# Patient Record
Sex: Female | Born: 1975
Health system: Southern US, Community
[De-identification: ages and names within clinical notes are randomized; demographics above are authoritative.]

## PROBLEM LIST (undated history)

## (undated) DIAGNOSIS — B1910 Unspecified viral hepatitis B without hepatic coma: Secondary | ICD-10-CM

## (undated) DIAGNOSIS — R0609 Other forms of dyspnea: Secondary | ICD-10-CM

## (undated) DIAGNOSIS — R079 Chest pain, unspecified: Secondary | ICD-10-CM

## (undated) DIAGNOSIS — I10 Essential (primary) hypertension: Secondary | ICD-10-CM

## (undated) DIAGNOSIS — J329 Chronic sinusitis, unspecified: Secondary | ICD-10-CM

## (undated) DIAGNOSIS — K219 Gastro-esophageal reflux disease without esophagitis: Secondary | ICD-10-CM

## (undated) DIAGNOSIS — N6310 Unspecified lump in the right breast, unspecified quadrant: Secondary | ICD-10-CM

## (undated) DIAGNOSIS — J45909 Unspecified asthma, uncomplicated: Secondary | ICD-10-CM

## (undated) DIAGNOSIS — B019 Varicella without complication: Secondary | ICD-10-CM

## (undated) DIAGNOSIS — R0600 Dyspnea, unspecified: Secondary | ICD-10-CM

## (undated) DIAGNOSIS — J309 Allergic rhinitis, unspecified: Secondary | ICD-10-CM

## (undated) DIAGNOSIS — F419 Anxiety disorder, unspecified: Secondary | ICD-10-CM

## (undated) DIAGNOSIS — K2970 Gastritis, unspecified, without bleeding: Secondary | ICD-10-CM

## (undated) DIAGNOSIS — J189 Pneumonia, unspecified organism: Secondary | ICD-10-CM

## (undated) DIAGNOSIS — H6690 Otitis media, unspecified, unspecified ear: Secondary | ICD-10-CM

## (undated) HISTORY — DX: Varicella without complication: B01.9

## (undated) HISTORY — DX: Unspecified viral hepatitis B without hepatic coma: B19.10

## (undated) HISTORY — DX: Unspecified lump in the right breast, unspecified quadrant: N63.10

## (undated) HISTORY — DX: Chest pain, unspecified: R07.9

## (undated) HISTORY — DX: Chronic sinusitis, unspecified: J32.9

## (undated) HISTORY — DX: Allergic rhinitis, unspecified: J30.9

## (undated) HISTORY — PX: INNER EAR SURGERY: SHX679

## (undated) HISTORY — PX: EYE SURGERY: SHX253

## (undated) HISTORY — DX: Gastro-esophageal reflux disease without esophagitis: K21.9

## (undated) HISTORY — DX: Pneumonia, unspecified organism: J18.9

## (undated) HISTORY — DX: Unspecified asthma, uncomplicated: J45.909

## (undated) HISTORY — DX: Other forms of dyspnea: R06.09

## (undated) HISTORY — DX: Gastritis, unspecified, without bleeding: K29.70

## (undated) HISTORY — DX: Dyspnea, unspecified: R06.00

## (undated) HISTORY — DX: Anxiety disorder, unspecified: F41.9

## (undated) HISTORY — DX: Essential (primary) hypertension: I10

## (undated) HISTORY — DX: Otitis media, unspecified, unspecified ear: H66.90

## (undated) HISTORY — PX: DIAGNOSTIC LAPAROSCOPY: SUR761

---

## 1996-12-11 HISTORY — PX: REFRACTIVE SURGERY: SHX103

## 2005-12-11 DIAGNOSIS — J189 Pneumonia, unspecified organism: Secondary | ICD-10-CM

## 2005-12-11 HISTORY — DX: Pneumonia, unspecified organism: J18.9

## 2006-12-11 HISTORY — PX: LAPAROSCOPIC OVARIAN CYSTECTOMY: SHX6248

## 2009-07-02 ENCOUNTER — Emergency Department: Payer: Self-pay | Admitting: Emergency Medicine

## 2013-02-08 HISTORY — PX: OTHER SURGICAL HISTORY: SHX169

## 2013-08-06 ENCOUNTER — Ambulatory Visit: Payer: Self-pay | Admitting: Gastroenterology

## 2014-01-15 ENCOUNTER — Ambulatory Visit: Payer: Self-pay | Admitting: Gastroenterology

## 2014-03-05 ENCOUNTER — Encounter: Payer: Self-pay | Admitting: Maternal & Fetal Medicine

## 2014-05-18 ENCOUNTER — Ambulatory Visit: Payer: Self-pay

## 2014-06-04 DIAGNOSIS — B1910 Unspecified viral hepatitis B without hepatic coma: Secondary | ICD-10-CM | POA: Insufficient documentation

## 2014-06-07 ENCOUNTER — Inpatient Hospital Stay: Payer: Self-pay

## 2014-06-07 LAB — CBC WITH DIFFERENTIAL/PLATELET
BASOS ABS: 0 10*3/uL (ref 0.0–0.1)
BASOS PCT: 0.3 %
Eosinophil #: 0.1 10*3/uL (ref 0.0–0.7)
Eosinophil %: 1.1 %
HCT: 39 % (ref 35.0–47.0)
HGB: 13.6 g/dL (ref 12.0–16.0)
LYMPHS ABS: 1.6 10*3/uL (ref 1.0–3.6)
LYMPHS PCT: 15.4 %
MCH: 32 pg (ref 26.0–34.0)
MCHC: 35 g/dL (ref 32.0–36.0)
MCV: 91 fL (ref 80–100)
MONOS PCT: 8.1 %
Monocyte #: 0.9 x10 3/mm (ref 0.2–0.9)
NEUTROS ABS: 8 10*3/uL — AB (ref 1.4–6.5)
NEUTROS PCT: 75.1 %
PLATELETS: 239 10*3/uL (ref 150–440)
RBC: 4.26 10*6/uL (ref 3.80–5.20)
RDW: 13.6 % (ref 11.5–14.5)
WBC: 10.6 10*3/uL (ref 3.6–11.0)

## 2014-06-09 LAB — HEMATOCRIT: HCT: 32 % — AB (ref 35.0–47.0)

## 2014-06-09 LAB — BETA STREP CULTURE(ARMC)

## 2014-06-09 LAB — PATHOLOGY REPORT

## 2014-06-10 ENCOUNTER — Ambulatory Visit: Payer: Self-pay

## 2015-02-01 ENCOUNTER — Ambulatory Visit: Payer: Self-pay | Admitting: Gastroenterology

## 2015-04-03 NOTE — Consult Note (Signed)
   Maternal Age 39   Gravida 1   Para 0   Term Deliveries 0   Preterm Deliveries 0   Abortions 0   Living Children 0   Final EDD (dd-[id number]) 20-Jul-2014   GA Assessment: (Weeks) 34 week(s)   (Days) 2 day(s)   Blood Type (Maternal) B positive   Antibody Screen Results (Maternal) negative   HIV Results (Maternal) negative   Gonorrhea Results (Maternal) negative   Chlamydia Results (Maternal) negative   Hepatitis C Culture (Maternal) unknown   Herpes Results (Maternal) n/a   VDRL/RPR/Syphilis Results (Maternal) negative   Varicella Titer Results (Maternal) Positive   Rubella Results (Maternal) immune   Hepatitis B Surface Antigen Results (Maternal) positive   Group B Strep Results Maternal (Result >5wks must be treated as unknown) unknown/result > 5 weeks ago   Prenatal Care Adequate    Additional Comments Asked by T Brothers, CNM (Dr. Cristino Martes, supervising OB) to speak with 39 yo G1 mother after she was admitted with PROM (clear) about 7 am today and early labor.  Pregnancy is IVF induced and complicated by diet-controlled gestational DM and mother is Hep BsAG positive.. No fever or other Sx of infection; GBS unknown.  Spoke with patient and FOB about usual expectations for preterm infants at about [redacted] wks EGA, including need for SCN admission, possible respiratory distress, glucose or temp instability, and delayed onset of adequate PO feeding.  Projected probable LOS 2 - 3 wks depending primarily on establishment of oral feedings.  Also discussed breast feeding and skinp-to-skin care.  Patient and FOB were attentive and asked appropriate questions.  They expressed appreciation for my input.  Thank you for consulting Neonatology  Consult time 25 minutes (20 minutes face-to-face)   Parental Contact Consulted with Obstetrician regarding treatment options, As above   Electronic Signatures: Bettey Costa (MD)  (Signed 28-Jun-15 14:01)  Authored: PREGNANCY and LABOR,  ADDITIONAL COMMENTS   Last Updated: 28-Jun-15 14:01 by Bettey Costa (MD)

## 2015-04-20 NOTE — H&P (Signed)
L&D Evaluation:  History Expanded:  HPI 39 yo G1, EDD of 07/17/14 s/p IVF, presents at [redacted]w[redacted]d with c/o large gush of fluid. +FM, occasional contractions, +bloody show. PNC at [facility] notable for early entry to care after IVF, A1GDM with good control, AMA with negative Informaseq. 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 Received TDAP at 32 wks. EFW 4 lb 9oz (60%) at 32 wks.   Blood Type (Maternal) B positive   Group B Strep Results Maternal (Result >5wks must be treated as unknown) unknown/result > 5 weeks ago   Maternal HIV Negative   Maternal Syphilis Ab Nonreactive   Maternal Varicella Immune   Rubella Results (Maternal) immune   Maternal T-Dap Immune   Patient's Medical History Hep B carrier   Patient's Surgical History cystectomy, salpingectomy, eye surgery, IVF   Medications Pre Natal Vitamins   Allergies Novocaine (ok with Lidocaine)   Social History none   Exam:  Vital Signs 130s/60s-80s   General no apparent distress   Mental Status clear   Chest clear   Heart no murmur/gallop/rubs   Abdomen gravid, tender with contractions   Estimated Fetal Weight Average for gestational age   Edema no edema   Pelvic 1 per RN   Mebranes Ruptured, grossly ruptured   Description clear   FHT normal rate with no decels, category 1   Ucx q 4-5 min   Impression:  Impression IUP at [redacted]w[redacted]d with PPROM   Plan:  Plan antibiotics for GBBS prophylaxis   Comments Will augment labor as needed after antibiotics in x 4 hours Neonatology consult   Electronic Signatures: Ander Purpura (CNM)  (Signed 28-Jun-15 11:06)  Authored: L&D Evaluation   Last Updated: 28-Jun-15 11:06 by Ander Purpura (CNM)

## 2015-10-25 DIAGNOSIS — K2970 Gastritis, unspecified, without bleeding: Secondary | ICD-10-CM | POA: Insufficient documentation

## 2015-10-25 DIAGNOSIS — J309 Allergic rhinitis, unspecified: Secondary | ICD-10-CM | POA: Insufficient documentation

## 2015-10-25 DIAGNOSIS — K2930 Chronic superficial gastritis without bleeding: Secondary | ICD-10-CM | POA: Insufficient documentation

## 2015-10-25 DIAGNOSIS — F419 Anxiety disorder, unspecified: Secondary | ICD-10-CM | POA: Insufficient documentation

## 2015-11-17 ENCOUNTER — Ambulatory Visit
Admission: RE | Admit: 2015-11-17 | Discharge: 2015-11-17 | Disposition: A | Discharge status: Home / Self Care | Payer: BLUE CROSS/BLUE SHIELD | Source: Ambulatory Visit | Attending: Family Medicine | Admitting: Family Medicine

## 2015-11-17 ENCOUNTER — Encounter: Payer: Self-pay | Admitting: Family Medicine

## 2015-11-17 ENCOUNTER — Ambulatory Visit (INDEPENDENT_AMBULATORY_CARE_PROVIDER_SITE_OTHER): Payer: BLUE CROSS/BLUE SHIELD | Admitting: Family Medicine

## 2015-11-17 VITALS — BP 120/83 | HR 88 | Temp 98.3°F | Ht 61.0 in | Wt 174.0 lb

## 2015-11-17 DIAGNOSIS — M546 Pain in thoracic spine: Secondary | ICD-10-CM | POA: Insufficient documentation

## 2015-11-17 DIAGNOSIS — R079 Chest pain, unspecified: Secondary | ICD-10-CM | POA: Diagnosis not present

## 2015-11-17 DIAGNOSIS — M25561 Pain in right knee: Secondary | ICD-10-CM | POA: Insufficient documentation

## 2015-11-17 DIAGNOSIS — M47814 Spondylosis without myelopathy or radiculopathy, thoracic region: Secondary | ICD-10-CM | POA: Insufficient documentation

## 2015-11-17 DIAGNOSIS — Z0000 Encounter for general adult medical examination without abnormal findings: Secondary | ICD-10-CM | POA: Diagnosis not present

## 2015-11-17 DIAGNOSIS — H9193 Unspecified hearing loss, bilateral: Secondary | ICD-10-CM | POA: Diagnosis not present

## 2015-11-17 DIAGNOSIS — R0602 Shortness of breath: Secondary | ICD-10-CM | POA: Insufficient documentation

## 2015-11-17 LAB — TROPONIN I

## 2015-11-17 LAB — D-DIMER, QUANTITATIVE

## 2015-11-17 NOTE — Progress Notes (Signed)
BP 120/83 mmHg  Pulse 88  Temp(Src) 98.3 F (36.8 C)  Ht 5\' 1"  (1.549 m)  Wt 174 lb (78.926 kg)  BMI 32.89 kg/m2  SpO2 96%  LMP 11/14/2015 (Exact Date)   Subjective:    Patient ID: Laura Espinoza, female    DOB: [birth year], 39 y.o.   MRN: [id number]  HPI: Laura Espinoza is a 39 y.o. female  Chief Complaint  Patient presents with  . Annual Exam    done at GYN   She says she has been having chest pain; started 1-1/2 months ago; almost every day; tightness like squeezing, pointing over the left side of the chest and under the axilla; no shortness of breath; will stay all day; dull pain; comes and goes; sometimes gone for a whole day; right now, it is mild to moderate; sometimes with nausea She thinks it might be related to stress; possible lack of rest and anxiety Left arm gets numb; has dull pain under the left arm along chest wall and cannot lay on that side when she sleeps She does not exercise at all, so wouldn't know if exercise made it worse Very rare heartburn and acid reflux, and that's not this, alleviated with Tums Hx of pneumonia but pain is not in same place Sometimes doesn't feel like she has enough air No hx personally or in family of DVT or PE No fevers No cough No rash like shingles No back pain, nothing like pinched nerve wrapping around above the bra; rarely has lower back pain when sitting a long time at work Some poor sleep lately; felt hot at night; wonders if anxiety; no night sweats She has hx of anxiety; she was treated in the past, but made her feel so gray so she stopped it She uses camomile tea but no tylenol or heating pads Mother had heart disease; high blood pressure, then heart attack 6 years ago; in her 72's Father always had heart problems, in hospital several times with "pre-heart attack conditions" No chance she is pregnant, just had period  She also has problems with the right knee; walks and it clicks; really sharp pain, like something moving  around; can't barely walk at times; doesn't walk; pops and clicks; just a little bit of swelling; little bit of warmth; no hx of gout; no hx of injury to the knee; parents both have arthritis; she tried cream to massage over it; going on for many months, pain started when cold weather 1-2 months ago  Past Medical History  Diagnosis Date  . Allergic rhinitis   . Anxiety   . Gastritis   . OM (otitis media), recurrent     as a child; ruptured TM  . Recurrent sinusitis   . Pneumonia 2007    hospitalized for possible pneumonia in [place] for 2 weeks   Past Surgical History  Procedure Laterality Date  . Refractive surgery Bilateral 1998  . Laparoscopic ovarian cystectomy  2008    in [place]  . Fallopian tube removal Left March 2014   Family History  Problem Relation Age of Onset  . Hypertension Mother   . Heart disease Mother   . CAD Mother   . Heart attack Mother   . Arthritis Mother   . CAD Father   . Heart disease Father   . Glaucoma Father   . Cancer Father     kidney  . Hypertension Brother   . COPD Neg Hx   . Diabetes Neg Hx   .  Stroke Neg Hx    Social History  Substance Use Topics  . Smoking status: Never Smoker   . Smokeless tobacco: Never Used  . Alcohol Use: No     Comment: occasional   Relevant past medical, surgical, family and social history reviewed and updated as indicated. Interim medical history since our last visit reviewed. Allergies and medications reviewed and updated.  Review of Systems  Constitutional: Negative for fever.  Respiratory: Positive for shortness of breath. Negative for cough.   Cardiovascular: Positive for chest pain.  Musculoskeletal: Positive for arthralgias.  Hematological: Does not bruise/bleed easily.  Psychiatric/Behavioral: Negative for dysphoric mood. The patient is nervous/anxious.    Per HPI unless specifically indicated above     Objective:    BP 120/83 mmHg  Pulse 88  Temp(Src) 98.3 F (36.8 C)  Ht 5\' 1"  (1.549 m)   Wt 174 lb (78.926 kg)  BMI 32.89 kg/m2  SpO2 96%  LMP 11/14/2015 (Exact Date)  Wt Readings from Last 3 Encounters:  11/17/15 174 lb (78.926 kg)  03/22/15 168 lb (76.204 kg)    Physical Exam  Constitutional: She appears well-developed and well-nourished.  HENT:  Head: Normocephalic and atraumatic.  Right Ear: External ear normal.  Left Ear: External ear normal.  Nose: Nose normal.  Mouth/Throat: Oropharynx is clear and moist and mucous membranes are normal. Mucous membranes are not dry. No oropharyngeal exudate.  Deformed TM  Eyes: EOM are normal. Right eye exhibits no discharge. Left eye exhibits no discharge. No scleral icterus.  Neck: No JVD present. No thyromegaly present.  Cardiovascular: Normal rate and regular rhythm.  Exam reveals no gallop and no friction rub.   No murmur heard. Pulmonary/Chest: Effort normal and breath sounds normal. No respiratory distress. She has no wheezes. She has no rales.  Musculoskeletal: Normal range of motion. She exhibits no edema.  Lymphadenopathy:    She has no cervical adenopathy.  Neurological: She is alert. She displays normal reflexes. She exhibits normal muscle tone.  Skin: Skin is warm and dry. No rash noted. No erythema. No pallor.  Psychiatric: She has a normal mood and affect. Her behavior is normal. Judgment and thought content normal.      Assessment & Plan:   Problem List Items Addressed This Visit      Other   Chest pain at rest   Relevant Orders   EKG 12-Lead (Completed)   D-Dimer, Quantitative (Completed)   Troponin I (Completed)   Right knee pain   Relevant Orders   DG Knee Complete 4 Views Right (Completed)   Thoracic back pain   Relevant Orders   DG Thoracic Spine W/Swimmers (Completed)   Shortness of breath   Relevant Orders   DG Chest 2 View (Completed)   Preventative health care - Primary   Relevant Orders   Lipid Panel w/o Chol/HDL Ratio (Completed)   HIV antibody (Completed)   Comprehensive metabolic  panel (Completed)   CBC with Differential/Platelet (Completed)    Other Visit Diagnoses    Hearing loss, bilateral        Relevant Orders    Ambulatory referral to ENT       Follow up plan: Return in about 3 weeks (around 12/08/2015) for recheck of chest pain and other symptoms.  Orders Placed This Encounter  Procedures  . DG Knee Complete 4 Views Right  . DG Chest 2 View  . DG Thoracic Spine W/Swimmers  . D-Dimer, Quantitative  . Lipid Panel w/o Chol/HDL Ratio  .  HIV antibody  . Comprehensive metabolic panel  . CBC with Differential/Platelet  . Troponin I  . Ambulatory referral to ENT  . EKG 12-Lead   An after-visit summary was printed and given to the patient at [facility].  Please see the patient instructions which may contain other information and recommendations beyond what is mentioned above in the assessment and plan.

## 2015-11-17 NOTE — Patient Instructions (Addendum)
Do call 911 if you develop chest pain and think you are having a heart attack We'll have you see the ear specialist We'll get labs today and have you get xrays Let me know if you would like to see a knee specialist or start physical therapy If you have not heard anything from my staff in a week about any orders/referrals/studies from today, please contact us here to follow-up [phone] Check out the information at familydoctor.org entitled "What It Takes to Lose Weight" Try to lose between 1-2 pounds per week by taking in fewer calories and burning off more calories You can succeed by limiting portions, limiting foods dense in calories and fat, becoming more active, and drinking 8 glasses of water a day (64 ounces) Don't skip meals, especially breakfast, as skipping meals may alter your metabolism Do not use over-the-counter weight loss pills or gimmicks that claim rapid weight loss A healthy BMI (or body mass index) is between 18.5 and 24.9 You can calculate your ideal BMI at the [place] website [employer]fr   Health Maintenance, Female Adopting a healthy lifestyle and getting preventive care can go a long way to promote health and wellness. Talk with your health care provider about what schedule of regular examinations is right for you. This is a good chance for you to check in with your provider about disease prevention and staying healthy. In between checkups, there are plenty of things you can do on your own. Experts have done a lot of research about which lifestyle changes and preventive measures are most likely to keep you healthy. Ask your health care provider for more information. WEIGHT AND DIET  Eat a healthy diet  Be sure to include plenty of vegetables, fruits, low-fat dairy products, and lean protein.  Do not eat a lot of foods high in solid fats, added sugars, or salt.  Get regular exercise. This is one of the most  important things you can do for your health.  Most adults should exercise for at least 150 minutes each week. The exercise should increase your heart rate and make you sweat (moderate-intensity exercise).  Most adults should also do strengthening exercises at least twice a week. This is in addition to the moderate-intensity exercise.  Maintain a healthy weight  Body mass index (BMI) is a measurement that can be used to identify possible weight problems. It estimates body fat based on height and weight. Your health care provider can help determine your BMI and help you achieve or maintain a healthy weight.  For females 67 years of age and older:   A BMI below 18.5 is considered underweight.  A BMI of 18.5 to 24.9 is normal.  A BMI of 25 to 29.9 is considered overweight.  A BMI of 30 and above is considered obese.  Watch levels of cholesterol and blood lipids  You should start having your blood tested for lipids and cholesterol at 38 years of age, then have this test every 5 years.  You may need to have your cholesterol levels checked more often if:  Your lipid or cholesterol levels are high.  You are older than 38 years of age.  You are at high risk for heart disease.  CANCER SCREENING   Lung Cancer  Lung cancer screening is recommended for adults 31-40 years old who are at high risk for lung cancer because of a history of smoking.  A yearly low-dose CT scan of the lungs is recommended for people who:  Currently smoke.  Have quit within the past 15 years.  Have at least a 30-pack-year history of smoking. A pack year is smoking an average of one pack of cigarettes a day for 1 year.  Yearly screening should continue until it has been 15 years since you quit.  Yearly screening should stop if you develop a health problem that would prevent you from having lung cancer treatment.  Breast Cancer  Practice breast self-awareness. This means understanding how your breasts  normally appear and feel.  It also means doing regular breast self-exams. Let your health care provider know about any changes, no matter how small.  If you are in your 20s or 30s, you should have a clinical breast exam (CBE) by a health care provider every 1-3 years as part of a regular health exam.  If you are 59 or older, have a CBE every year. Also consider having a breast X-ray (mammogram) every year.  If you have a family history of breast cancer, talk to your health care provider about genetic screening.  If you are at high risk for breast cancer, talk to your health care provider about having an MRI and a mammogram every year.  Breast cancer gene (BRCA) assessment is recommended for women who have family members with BRCA-related cancers. BRCA-related cancers include:  Breast.  Ovarian.  Tubal.  Peritoneal cancers.  Results of the assessment will determine the need for genetic counseling and BRCA1 and BRCA2 testing. Cervical Cancer Your health care provider may recommend that you be screened regularly for cancer of the pelvic organs (ovaries, uterus, and vagina). This screening involves a pelvic examination, including checking for microscopic changes to the surface of your cervix (Pap test). You may be encouraged to have this screening done every 3 years, beginning at age 23.  For women ages 83-65, health care providers may recommend pelvic exams and Pap testing every 3 years, or they may recommend the Pap and pelvic exam, combined with testing for human papilloma virus (HPV), every 5 years. Some types of HPV increase your risk of cervical cancer. Testing for HPV may also be done on women of any age with unclear Pap test results.  Other health care providers may not recommend any screening for nonpregnant women who are considered low risk for pelvic cancer and who do not have symptoms. Ask your health care provider if a screening pelvic exam is right for you.  If you have had  past treatment for cervical cancer or a condition that could lead to cancer, you need Pap tests and screening for cancer for at least 20 years after your treatment. If Pap tests have been discontinued, your risk factors (such as having a new sexual partner) need to be reassessed to determine if screening should resume. Some women have medical problems that increase the chance of getting cervical cancer. In these cases, your health care provider may recommend more frequent screening and Pap tests. Colorectal Cancer  This type of cancer can be detected and often prevented.  Routine colorectal cancer screening usually begins at 38 years of age and continues through 38 years of age.  Your health care provider may recommend screening at an earlier age if you have risk factors for colon cancer.  Your health care provider may also recommend using home test kits to check for hidden blood in the stool.  A small camera at the end of a tube can be used to examine your colon directly (sigmoidoscopy or colonoscopy). This is done to check  for the earliest forms of colorectal cancer.  Routine screening usually begins at age 20.  Direct examination of the colon should be repeated every 5-10 years through 38 years of age. However, you may need to be screened more often if early forms of precancerous polyps or small growths are found. Skin Cancer  Check your skin from head to toe regularly.  Tell your health care provider about any new moles or changes in moles, especially if there is a change in a mole's shape or color.  Also tell your health care provider if you have a mole that is larger than the size of a pencil eraser.  Always use sunscreen. Apply sunscreen liberally and repeatedly throughout the day.  Protect yourself by wearing long sleeves, pants, a wide-brimmed hat, and sunglasses whenever you are outside. HEART DISEASE, DIABETES, AND HIGH BLOOD PRESSURE   High blood pressure causes heart disease  and increases the risk of stroke. High blood pressure is more likely to develop in:  People who have blood pressure in the high end of the normal range (130-139/85-89 mm Hg).  People who are overweight or obese.  People who are African American.  If you are 45-82 years of age, have your blood pressure checked every 3-5 years. If you are 64 years of age or older, have your blood pressure checked every year. You should have your blood pressure measured twice--once when you are at a hospital or clinic, and once when you are not at a hospital or clinic. Record the average of the two measurements. To check your blood pressure when you are not at a hospital or clinic, you can use:  An automated blood pressure machine at a pharmacy.  A home blood pressure monitor.  If you are between 6 years and 97 years old, ask your health care provider if you should take aspirin to prevent strokes.  Have regular diabetes screenings. This involves taking a blood sample to check your fasting blood sugar level.  If you are at a normal weight and have a low risk for diabetes, have this test once every three years after 38 years of age.  If you are overweight and have a high risk for diabetes, consider being tested at a younger age or more often. PREVENTING INFECTION  Hepatitis B  If you have a higher risk for hepatitis B, you should be screened for this virus. You are considered at high risk for hepatitis B if:  You were born in a country where hepatitis B is common. Ask your health care provider which countries are considered high risk.  Your parents were born in a high-risk country, and you have not been immunized against hepatitis B (hepatitis B vaccine).  You have HIV or AIDS.  You use needles to inject street drugs.  You live with someone who has hepatitis B.  You have had sex with someone who has hepatitis B.  You get hemodialysis treatment.  You take certain medicines for conditions, including  cancer, organ transplantation, and autoimmune conditions. Hepatitis C  Blood testing is recommended for:  Everyone born from [birth year] through 1964.  Anyone with known risk factors for hepatitis C. Sexually transmitted infections (STIs)  You should be screened for sexually transmitted infections (STIs) including gonorrhea and chlamydia if:  You are sexually active and are younger than 38 years of age.  You are older than 38 years of age and your health care provider tells you that you are at risk for this type  of infection.  Your sexual activity has changed since you were last screened and you are at an increased risk for chlamydia or gonorrhea. Ask your health care provider if you are at risk.  If you do not have HIV, but are at risk, it may be recommended that you take a prescription medicine daily to prevent HIV infection. This is called pre-exposure prophylaxis (PrEP). You are considered at risk if:  You are sexually active and do not regularly use condoms or know the HIV status of your partner(s).  You take drugs by injection.  You are sexually active with a partner who has HIV. Talk with your health care provider about whether you are at high risk of being infected with HIV. If you choose to begin PrEP, you should first be tested for HIV. You should then be tested every 3 months for as long as you are taking PrEP.  PREGNANCY   If you are premenopausal and you may become pregnant, ask your health care provider about preconception counseling.  If you may become pregnant, take 400 to 800 micrograms (mcg) of folic acid every day.  If you want to prevent pregnancy, talk to your health care provider about birth control (contraception). OSTEOPOROSIS AND MENOPAUSE   Osteoporosis is a disease in which the bones lose minerals and strength with aging. This can result in serious bone fractures. Your risk for osteoporosis can be identified using a bone density scan.  If you are 72 years of  age or older, or if you are at risk for osteoporosis and fractures, ask your health care provider if you should be screened.  Ask your health care provider whether you should take a calcium or vitamin D supplement to lower your risk for osteoporosis.  Menopause may have certain physical symptoms and risks.  Hormone replacement therapy may reduce some of these symptoms and risks. Talk to your health care provider about whether hormone replacement therapy is right for you.  HOME CARE INSTRUCTIONS   Schedule regular health, dental, and eye exams.  Stay current with your immunizations.   Do not use any tobacco products including cigarettes, chewing tobacco, or electronic cigarettes.  If you are pregnant, do not drink alcohol.  If you are breastfeeding, limit how much and how often you drink alcohol.  Limit alcohol intake to no more than 1 drink per day for nonpregnant women. One drink equals 12 ounces of beer, 5 ounces of wine, or 1 ounces of hard liquor.  Do not use street drugs.  Do not share needles.  Ask your health care provider for help if you need support or information about quitting drugs.  Tell your health care provider if you often feel depressed.  Tell your health care provider if you have ever been abused or do not feel safe at home.   This information is not intended to replace advice given to you by your health care provider. Make sure you discuss any questions you have with your health care provider.   Document Released: 06/12/2011 Document Revised: 12/18/2014 Document Reviewed: 10/29/2013 Elsevier Interactive Patient Education [employer].

## 2015-11-18 LAB — CBC WITH DIFFERENTIAL/PLATELET
BASOS ABS: 0 10*3/uL (ref 0.0–0.2)
Basos: 0 %
EOS (ABSOLUTE): 0.1 10*3/uL (ref 0.0–0.4)
Eos: 2 %
HEMATOCRIT: 43.6 % (ref 34.0–46.6)
HEMOGLOBIN: 15.3 g/dL (ref 11.1–15.9)
Immature Grans (Abs): 0 10*3/uL (ref 0.0–0.1)
Immature Granulocytes: 0 %
LYMPHS ABS: 1.7 10*3/uL (ref 0.7–3.1)
Lymphs: 30 %
MCH: 31.1 pg (ref 26.6–33.0)
MCHC: 35.1 g/dL (ref 31.5–35.7)
MCV: 89 fL (ref 79–97)
MONOCYTES: 7 %
Monocytes Absolute: 0.4 10*3/uL (ref 0.1–0.9)
NEUTROS ABS: 3.5 10*3/uL (ref 1.4–7.0)
Neutrophils: 61 %
Platelets: 296 10*3/uL (ref 150–379)
RBC: 4.92 x10E6/uL (ref 3.77–5.28)
RDW: 13.7 % (ref 12.3–15.4)
WBC: 5.8 10*3/uL (ref 3.4–10.8)

## 2015-11-18 LAB — COMPREHENSIVE METABOLIC PANEL
A/G RATIO: 1.7 (ref 1.1–2.5)
ALBUMIN: 4.5 g/dL (ref 3.5–5.5)
ALT: 15 IU/L (ref 0–32)
AST: 19 IU/L (ref 0–40)
Alkaline Phosphatase: 67 IU/L (ref 39–117)
BILIRUBIN TOTAL: 0.7 mg/dL (ref 0.0–1.2)
BUN / CREAT RATIO: 15 (ref 8–20)
BUN: 13 mg/dL (ref 6–20)
CHLORIDE: 98 mmol/L (ref 97–106)
CO2: 25 mmol/L (ref 18–29)
Calcium: 9.8 mg/dL (ref 8.7–10.2)
Creatinine, Ser: 0.85 mg/dL (ref 0.57–1.00)
GFR calc non Af Amer: 87 mL/min/{1.73_m2} (ref >59)
GFR, EST AFRICAN AMERICAN: 101 mL/min/{1.73_m2} (ref >59)
GLUCOSE: 82 mg/dL (ref 65–99)
Globulin, Total: 2.7 g/dL (ref 1.5–4.5)
Potassium: 5 mmol/L (ref 3.5–5.2)
Sodium: 139 mmol/L (ref 136–144)
TOTAL PROTEIN: 7.2 g/dL (ref 6.0–8.5)

## 2015-11-18 LAB — LIPID PANEL W/O CHOL/HDL RATIO
CHOLESTEROL TOTAL: 207 mg/dL — AB (ref 100–199)
HDL: 59 mg/dL (ref >39)
LDL Calculated: 117 mg/dL — ABNORMAL HIGH (ref 0–99)
TRIGLYCERIDES: 153 mg/dL — AB (ref 0–149)
VLDL Cholesterol Cal: 31 mg/dL (ref 5–40)

## 2015-11-18 LAB — HIV ANTIBODY (ROUTINE TESTING W REFLEX): HIV Screen 4th Generation wRfx: NONREACTIVE

## 2015-11-27 ENCOUNTER — Telehealth: Payer: Self-pay | Admitting: Family Medicine

## 2015-11-27 DIAGNOSIS — R079 Chest pain, unspecified: Secondary | ICD-10-CM

## 2015-11-27 NOTE — Telephone Encounter (Signed)
Call patient with xray and lab results

## 2015-11-28 NOTE — Telephone Encounter (Signed)
I called, left message that there is no alarm, just catching up on labs and xrays, calling people with results today; I'll try her tomorrow

## 2015-11-29 MED ORDER — SERTRALINE HCL 50 MG PO TABS: ORAL_TABLET | ORAL | Status: DC

## 2015-11-29 NOTE — Assessment & Plan Note (Signed)
Negative trop I, negative D-dimer; 6-8 weeks; worse with stress; refer to cardiologist

## 2015-11-29 NOTE — Telephone Encounter (Signed)
I spoke with patient She is feeling better since last visit The right knee is doing better; not painful; explained xray no OA We reviewed all of the labs She still has chest pains at times; not as often; thinks it is anxiety; happening when stressed It is after being stressed Both parents had heart disease Refer to cardiologist for evaluation; may have MVP; if serious symptoms, call 911; her husband is a paramedic I offered medicine for stress, anxiety; was very nauseated with whatever she took earlier; start sertraline; stop and call me if any problems; may take a few weeks We also talked about her scoliosis; will have her see Dr. Wynetta Emery for evaluation as that may be the cause of her thoracic/chest pain

## 2015-12-01 ENCOUNTER — Ambulatory Visit (INDEPENDENT_AMBULATORY_CARE_PROVIDER_SITE_OTHER): Payer: BLUE CROSS/BLUE SHIELD | Admitting: Cardiovascular Disease

## 2015-12-01 ENCOUNTER — Encounter: Payer: Self-pay | Admitting: Cardiovascular Disease

## 2015-12-01 VITALS — BP 124/80 | HR 78 | Ht 61.0 in | Wt 174.8 lb

## 2015-12-01 DIAGNOSIS — R079 Chest pain, unspecified: Secondary | ICD-10-CM

## 2015-12-01 DIAGNOSIS — R0602 Shortness of breath: Secondary | ICD-10-CM

## 2015-12-01 NOTE — Progress Notes (Signed)
PCP: Dr. Sanda Klein  HPI  This is a pleasant 39 year old female who was referred for evaluation of chest pain and shortness of breath. She has no previous cardiac history and no significant chronic medical conditions. She is not a smoker. She does have family history of coronary artery disease but not prematurely. She works as an Optometrist. Over the last 2 months, she has experienced intermittent episodes of substernal chest pain described as tightness and burning sensation happening mostly at rest and not related to physical activities. It's mostly noticeable when she is under stress. She has also noticed dyspnea in the way that she doesn't feel like she is getting enough air when she breathes. No orthopnea, PND or lower extremity edema. No history of DVT or pulmonary embolism. She is not on birth control.  Allergies  Allergen Reactions  . Novocain [Procaine] Anaphylaxis     Current Outpatient Prescriptions on File Prior to Visit  Medication Sig Dispense Refill  . albuterol (PROVENTIL HFA;VENTOLIN HFA) 108 (90 BASE) MCG/ACT inhaler Inhale 2 puffs into the lungs every 4 (four) hours as needed for wheezing or shortness of breath.    . beclomethasone (QVAR) 40 MCG/ACT inhaler Inhale 2 puffs into the lungs 2 (two) times daily as needed.     . fluticasone (FLONASE) 50 MCG/ACT nasal spray Place 2 sprays into both nostrils as needed.     . Multiple Vitamin (MULTIVITAMIN) tablet Take 1 tablet by mouth daily.    . sertraline (ZOLOFT) 50 MG tablet One-half of a pill daily for 8 days, and then one whole pill daily 26 tablet 0   No current facility-administered medications on file prior to visit.     Past Medical History  Diagnosis Date  . Allergic rhinitis   . Anxiety   . Gastritis   . OM (otitis media), recurrent     as a child; ruptured TM  . Recurrent sinusitis   . Pneumonia 2007    hospitalized for possible pneumonia in [place] for 2 weeks     Past Surgical History  Procedure Laterality  Date  . Refractive surgery Bilateral 1998  . Laparoscopic ovarian cystectomy  2008    in [place]  . Fallopian tube removal Left March 2014     Family History  Problem Relation Age of Onset  . Hypertension Mother   . Heart disease Mother   . CAD Mother   . Heart attack Mother   . Arthritis Mother   . CAD Father   . Heart disease Father   . Glaucoma Father   . Cancer Father     kidney  . Hypertension Brother   . COPD Neg Hx   . Diabetes Neg Hx   . Stroke Neg Hx      Social History   Social History  . Marital Status: Married    Spouse Name: N/A  . Number of Children: N/A  . Years of Education: N/A   Occupational History  . Not on file.   Social History Main Topics  . Smoking status: Never Smoker   . Smokeless tobacco: Never Used  . Alcohol Use: Yes     Comment: occasional  . Drug Use: No  . Sexual Activity: Not on file   Other Topics Concern  . Not on file   Social History Narrative     ROS A 10 point review of system was performed. It is negative other than that mentioned in the history of present illness.   PHYSICAL EXAM  BP 124/80 mmHg  Pulse 78  Ht 5\' 1"  (1.549 m)  Wt 174 lb 12 oz (79.266 kg)  BMI 33.04 kg/m2  LMP 11/14/2015 (Exact Date)  Constitutional: She is oriented to person, place, and time. She appears well-developed and well-nourished. No distress.  HENT: No nasal discharge.  Head: Normocephalic and atraumatic.  Eyes: Pupils are equal and round. No discharge.  Neck: Normal range of motion. Neck supple. No JVD present. No thyromegaly present.  Cardiovascular: Normal rate, regular rhythm, normal heart sounds. Exam reveals no gallop and no friction rub. No murmur heard.  Pulmonary/Chest: Effort normal and breath sounds normal. No stridor. No respiratory distress. She has no wheezes. She has no rales. She exhibits no tenderness.  Abdominal: Soft. Bowel sounds are normal. She exhibits no distension. There is no tenderness. There is no  rebound and no guarding.  Musculoskeletal: Normal range of motion. She exhibits no edema and no tenderness.  Neurological: She is alert and oriented to person, place, and time. Coordination normal.  Skin: Skin is warm and dry. No rash noted. She is not diaphoretic. No erythema. No pallor.  Psychiatric: She has a normal mood and affect. Her behavior is normal. Judgment and thought content normal.    EKG: Normal sinus rhythm with no significant ST or T wave   ASSESSMENT AND PLAN

## 2015-12-01 NOTE — Patient Instructions (Signed)
Medication Instructions:  Your physician recommends that you continue on your current medications as directed. Please refer to the Current Medication list given to you today.  Labwork: none  Testing/Procedures: Your physician has requested that you have an exercise tolerance test. For further information please visit [email or web address]. Please also follow instruction sheet, as given.  Your physician has requested that you have an echocardiogram. Echocardiography is a painless test that uses sound waves to create images of your heart. It provides your doctor with information about the size and shape of your heart and how well your heart's chambers and valves are working. This procedure takes approximately one hour. There are no restrictions for this procedure.    Follow-Up: Your physician recommends that you schedule a follow-up appointment as needed.    Any Other Special Instructions Will Be Listed Below (If Applicable).     If you need a refill on your cardiac medications before your next appointment, please call your pharmacy.  Echocardiogram An echocardiogram, or echocardiography, uses sound waves (ultrasound) to produce an image of your heart. The echocardiogram is simple, painless, obtained within a short period of time, and offers valuable information to your health care provider. The images from an echocardiogram can provide information such as:  Evidence of coronary artery disease (CAD).  Heart size.  Heart muscle function.  Heart valve function.  Aneurysm detection.  Evidence of a past heart attack.  Fluid buildup around the heart.  Heart muscle thickening.  Assess heart valve function. LET [facility] CARE PROVIDER KNOW ABOUT:  Any allergies you have.  All medicines you are taking, including vitamins, herbs, eye drops, creams, and over-the-counter medicines.  Previous problems you or members of your family have had with the use of anesthetics.  Any blood  disorders you have.  Previous surgeries you have had.  Medical conditions you have.  Possibility of pregnancy, if this applies. BEFORE THE PROCEDURE  No special preparation is needed. Eat and drink normally.  PROCEDURE   In order to produce an image of your heart, gel will be applied to your chest and a wand-like tool (transducer) will be moved over your chest. The gel will help transmit the sound waves from the transducer. The sound waves will harmlessly bounce off your heart to allow the heart images to be captured in real-time motion. These images will then be recorded.  You may need an IV to receive a medicine that improves the quality of the pictures. AFTER THE PROCEDURE You may return to your normal schedule including diet, activities, and medicines, unless your health care provider tells you otherwise.   This information is not intended to replace advice given to you by your health care provider. Make sure you discuss any questions you have with your health care provider.   Document Released: 11/24/2000 Document Revised: 12/18/2014 Document Reviewed: 08/04/2013 Elsevier Interactive Patient Education 2016 [employer]. Exercise Stress Electrocardiogram An exercise stress electrocardiogram is a test that is done to evaluate the blood supply to your heart. This test may also be called exercise stress electrocardiography. The test is done while you are walking on a treadmill. The goal of this test is to raise your heart rate. This test is done to find areas of poor blood flow to the heart by determining the extent of coronary artery disease (CAD).   CAD is defined as narrowing in one or more heart (coronary) arteries of more than 70%. If you have an abnormal test result, this may mean that you  are not getting adequate blood flow to your heart during exercise. Additional testing may be needed to understand why your test was abnormal. LET [facility] CARE PROVIDER KNOW ABOUT:   Any  allergies you have.  All medicines you are taking, including vitamins, herbs, eye drops, creams, and over-the-counter medicines.  Previous problems you or members of your family have had with the use of anesthetics.  Any blood disorders you have.  Previous surgeries you have had.  Medical conditions you have.  Possibility of pregnancy, if this applies. RISKS AND COMPLICATIONS Generally, this is a safe procedure. However, as with any procedure, complications can occur. Possible complications can include:  Pain or pressure in the following areas:  Chest.  Jaw or neck.  Between your shoulder blades.  Radiating down your left arm.  Dizziness or light-headedness.  Shortness of breath.  Increased or irregular heartbeats.  Nausea or vomiting.  Heart attack (rare). BEFORE THE PROCEDURE  Avoid all forms of caffeine 24 hours before your test or as directed by your health care provider. This includes coffee, tea (even decaffeinated tea), caffeinated sodas, chocolate, cocoa, and certain pain medicines.  Follow your health care provider's instructions regarding eating and drinking before the test.  Take your medicines as directed at regular times with water unless instructed otherwise. Exceptions may include:  If you have diabetes, ask how you are to take your insulin or pills. It is common to adjust insulin dosing the morning of the test.  If you are taking beta-blocker medicines, it is important to talk to your health care provider about these medicines well before the date of your test. Taking beta-blocker medicines may interfere with the test. In some cases, these medicines need to be changed or stopped 24 hours or more before the test.  If you wear a nitroglycerin patch, it may need to be removed prior to the test. Ask your health care provider if the patch should be removed before the test.  If you use an inhaler for any breathing condition, bring it with you to the  test.  If you are an outpatient, bring a snack so you can eat right after the stress phase of the test.  Do not smoke for 4 hours prior to the test or as directed by your health care provider.  Do not apply lotions, powders, creams, or oils on your chest prior to the test.  Wear loose-fitting clothes and comfortable shoes for the test. This test involves walking on a treadmill. PROCEDURE  Multiple patches (electrodes) will be put on your chest. If needed, small areas of your chest may have to be shaved to get better contact with the electrodes. Once the electrodes are attached to your body, multiple wires will be attached to the electrodes and your heart rate will be monitored.  Your heart will be monitored both at rest and while exercising.  You will walk on a treadmill. The treadmill will be started at a slow pace. The treadmill speed and incline will gradually be increased to raise your heart rate. AFTER THE PROCEDURE  Your heart rate and blood pressure will be monitored after the test.  You may return to your normal schedule including diet, activities, and medicines, unless your health care provider tells you otherwise.   This information is not intended to replace advice given to you by your health care provider. Make sure you discuss any questions you have with your health care provider.   Document Released: 11/24/2000 Document Revised: 12/02/2013  Document Reviewed: 08/04/2013 Elsevier Interactive Patient Education [employer].

## 2015-12-01 NOTE — Assessment & Plan Note (Signed)
This is likely related to stress and anxiety. However, this is a diagnosis of exclusion. So far her cardiac exam is normal and baseline ECG is unremarkable. She has no risk factors for pulmonary embolism or DVT. I recommend evaluation with a treadmill stress test. Given her associated shortness of breath, I requested an echocardiogram as well. If these come back unremarkable, no further cardiac workup is recommended and she can follow-up as needed.

## 2015-12-02 ENCOUNTER — Other Ambulatory Visit: Payer: Self-pay | Admitting: Gastroenterology

## 2015-12-02 DIAGNOSIS — B181 Chronic viral hepatitis B without delta-agent: Secondary | ICD-10-CM

## 2015-12-03 ENCOUNTER — Ambulatory Visit
Admission: RE | Admit: 2015-12-03 | Discharge: 2015-12-03 | Disposition: A | Discharge status: Home / Self Care | Payer: BLUE CROSS/BLUE SHIELD | Source: Ambulatory Visit | Attending: Gastroenterology | Admitting: Gastroenterology

## 2015-12-03 ENCOUNTER — Ambulatory Visit: Payer: BLUE CROSS/BLUE SHIELD

## 2015-12-03 DIAGNOSIS — B181 Chronic viral hepatitis B without delta-agent: Secondary | ICD-10-CM | POA: Insufficient documentation

## 2015-12-09 ENCOUNTER — Encounter: Payer: Self-pay | Admitting: Family Medicine

## 2015-12-09 ENCOUNTER — Ambulatory Visit (INDEPENDENT_AMBULATORY_CARE_PROVIDER_SITE_OTHER): Payer: BLUE CROSS/BLUE SHIELD | Admitting: Family Medicine

## 2015-12-09 VITALS — BP 120/75 | HR 78 | Temp 97.8°F | Wt 174.0 lb

## 2015-12-09 DIAGNOSIS — M546 Pain in thoracic spine: Secondary | ICD-10-CM | POA: Diagnosis not present

## 2015-12-09 DIAGNOSIS — F419 Anxiety disorder, unspecified: Secondary | ICD-10-CM

## 2015-12-09 DIAGNOSIS — R079 Chest pain, unspecified: Secondary | ICD-10-CM

## 2015-12-09 MED ORDER — SERTRALINE HCL 50 MG PO TABS: 25.0000 mg | ORAL_TABLET | Freq: Every day | ORAL | Status: DC

## 2015-12-09 NOTE — Assessment & Plan Note (Signed)
She will see Dr. Wynetta Emery for this tomorrow

## 2015-12-09 NOTE — Assessment & Plan Note (Signed)
Improved with treatment of anxiety; stress test and echo pending; she has seen  Dr. Fletcher Anon

## 2015-12-09 NOTE — Progress Notes (Signed)
BP 120/75 mmHg  Pulse 78  Temp(Src) 97.8 F (36.6 C)  Wt 174 lb (78.926 kg)  SpO2 97%  LMP 11/14/2015 (Exact Date)   Subjective:    Patient ID: Laura Espinoza, female    DOB: [birth year], 39 y.o.   MRN: [id number]  HPI: Laura Espinoza is a 39 y.o. female  Chief Complaint  Patient presents with  . Follow-up    chest pain "Dr. Sanda Klein just wanted to see me back". She states her Chest pain is better. She did go to Cardiology.   She saw the cardiologist; he thinks the symptoms are stress and anxiety; less chest pain on the new medicine; he has ordered an echo and stress test for next week; Dr. Fletcher Anon; no new medicines from him; he listened to her heart and looked at EKG  She can tell a difference on the medicine already; more calm and more in control; a little drowsy in the morning; working well with half of a pill; not spinning; she would like to stay on half of a pill for now; feels like it is working on current dose; sleeping okay at night, sleeping better; just a little nausea, but nothing severe  Last lipids November 17, 2015:   Ref Range 3wk ago    Cholesterol, Total 100 - 199 mg/dL 207 (H)   Triglycerides 0 - 149 mg/dL 153 (H)   HDL >39 mg/dL 59   VLDL Cholesterol Cal 5 - 40 mg/dL 31   LDL Calculated 0 - 99 mg/dL 117 (H)       She tries to grill instead of fry; does eat some pork; lots of cheese; eggs 3-4 times a week  She saw the ENT; they checked hearing; they referred her to another doctor, another surgeon who can potentially fix it  She will be seeing Dr. Wynetta Emery tomorrow for her thoracic pain  Relevant past medical, surgical, family and social history reviewed and updated as indicated. Both parents had heart disease Interim medical history since our last visit reviewed. She has seen ENT and cardiologist Allergies and medications reviewed and updated.  Review of Systems  Per HPI unless specifically indicated above     Objective:    BP 120/75 mmHg  Pulse 78   Temp(Src) 97.8 F (36.6 C)  Wt 174 lb (78.926 kg)  SpO2 97%  LMP 11/14/2015 (Exact Date)  Wt Readings from Last 3 Encounters:  12/09/15 174 lb (78.926 kg)  12/01/15 174 lb 12 oz (79.266 kg)  11/17/15 174 lb (78.926 kg)    Physical Exam  Constitutional: She appears well-developed and well-nourished.  HENT:  Mouth/Throat: Mucous membranes are normal.  Eyes: EOM are normal. No scleral icterus.  Cardiovascular: Normal rate and regular rhythm.   Pulmonary/Chest: Effort normal and breath sounds normal.  Psychiatric: She has a normal mood and affect. Her behavior is normal.   Results for orders placed or performed in visit on 11/17/15  D-Dimer, Quantitative  Result Value Ref Range   D-DIMER <.20 0.00 - 0.49 mg/L FEU  Lipid Panel w/o Chol/HDL Ratio  Result Value Ref Range   Cholesterol, Total 207 (H) 100 - 199 mg/dL   Triglycerides 153 (H) 0 - 149 mg/dL   HDL 59 >39 mg/dL   VLDL Cholesterol Cal 31 5 - 40 mg/dL   LDL Calculated 117 (H) 0 - 99 mg/dL  HIV antibody  Result Value Ref Range   HIV Screen 4th Generation wRfx Non Reactive Non Reactive  Comprehensive metabolic panel  Result Value Ref Range   Glucose 82 65 - 99 mg/dL   BUN 13 6 - 20 mg/dL   Creatinine, Ser 0.85 0.57 - 1.00 mg/dL   GFR calc non Af Amer 87 >59 mL/min/1.73   GFR calc Af Amer 101 >59 mL/min/1.73   BUN/Creatinine Ratio 15 8 - 20   Sodium 139 136 - 144 mmol/L   Potassium 5.0 3.5 - 5.2 mmol/L   Chloride 98 97 - 106 mmol/L   CO2 25 18 - 29 mmol/L   Calcium 9.8 8.7 - 10.2 mg/dL   Total Protein 7.2 6.0 - 8.5 g/dL   Albumin 4.5 3.5 - 5.5 g/dL   Globulin, Total 2.7 1.5 - 4.5 g/dL   Albumin/Globulin Ratio 1.7 1.1 - 2.5   Bilirubin Total 0.7 0.0 - 1.2 mg/dL   Alkaline Phosphatase 67 39 - 117 IU/L   AST 19 0 - 40 IU/L   ALT 15 0 - 32 IU/L  CBC with Differential/Platelet  Result Value Ref Range   WBC 5.8 3.4 - 10.8 x10E3/uL   RBC 4.92 3.77 - 5.28 x10E6/uL   Hemoglobin 15.3 11.1 - 15.9 g/dL   Hematocrit 43.6  34.0 - 46.6 %   MCV 89 79 - 97 fL   MCH 31.1 26.6 - 33.0 pg   MCHC 35.1 31.5 - 35.7 g/dL   RDW 13.7 12.3 - 15.4 %   Platelets 296 150 - 379 x10E3/uL   Neutrophils 61 %   Lymphs 30 %   Monocytes 7 %   Eos 2 %   Basos 0 %   Neutrophils Absolute 3.5 1.4 - 7.0 x10E3/uL   Lymphocytes Absolute 1.7 0.7 - 3.1 x10E3/uL   Monocytes Absolute 0.4 0.1 - 0.9 x10E3/uL   EOS (ABSOLUTE) 0.1 0.0 - 0.4 x10E3/uL   Basophils Absolute 0.0 0.0 - 0.2 x10E3/uL   Immature Granulocytes 0 %   Immature Grans (Abs) 0.0 0.0 - 0.1 x10E3/uL  Troponin I  Result Value Ref Range   Troponin I <0.01 0.00 - 0.04 ng/mL      Assessment & Plan:   Problem List Items Addressed This Visit      Other   Anxiety    Consider staying on the medicine 3-6 months; she will call [facility] to see if okay to stay on sertraline if she does get pregnant; if they prefer a switch (to fluoxetine, e.g.), the OB can call me directly or they can tell patient which is their preferred SSRI and I can make that switch; patient encouraged to call me if we do make a switch if any problems, or to come in if feeling badly      Relevant Medications   sertraline (ZOLOFT) 50 MG tablet   Chest pain at rest - Primary    Improved with treatment of anxiety; stress test and echo pending; she has seen  Dr. Fletcher Anon      Thoracic back pain    She will see Dr. Wynetta Emery for this tomorrow         Follow up plan: Return 3-4 months, for follow-up.  Meds ordered this encounter  Medications  . DISCONTD: sertraline (ZOLOFT) 50 MG tablet    Sig: Take 0.5 tablets (25 mg total) by mouth at bedtime. One-half of a pill daily for 8 days, and then one whole pill daily    Dispense:  15 tablet    Refill:  5  . sertraline (ZOLOFT) 50 MG tablet    Sig: Take 0.5 tablets (25  mg total) by mouth at bedtime.    Dispense:  15 tablet    Refill:  5    DISREGARD the last Rx please

## 2015-12-09 NOTE — Patient Instructions (Addendum)
Try to limit saturated fats in your diet (bologna, hot dogs, barbeque, cheeseburgers, hamburgers, steak, bacon, sausage, cheese, etc.) and get more fresh fruits, vegetables, and whole grains Please do call your OB-GYN office and see if they like the current medicine, should you get pregnant; if they suggest another medicine, they can call me directly or let you know which medicine is their favorite and we can switch you Continue at 25 mg sertraline at bedtime for now

## 2015-12-09 NOTE — Assessment & Plan Note (Addendum)
Consider staying on the medicine 3-6 months; she will call [facility] to see if okay to stay on sertraline if she does get pregnant; if they prefer a switch (to fluoxetine, e.g.), the OB can call me directly or they can tell patient which is their preferred SSRI and I can make that switch; patient encouraged to call me if we do make a switch if any problems, or to come in if feeling badly

## 2015-12-10 ENCOUNTER — Encounter: Payer: Self-pay | Admitting: Family Medicine

## 2015-12-10 ENCOUNTER — Ambulatory Visit (INDEPENDENT_AMBULATORY_CARE_PROVIDER_SITE_OTHER): Payer: BLUE CROSS/BLUE SHIELD | Admitting: Family Medicine

## 2015-12-10 VITALS — BP 123/81 | HR 73 | Temp 98.2°F | Ht 61.0 in | Wt 177.0 lb

## 2015-12-10 DIAGNOSIS — R079 Chest pain, unspecified: Secondary | ICD-10-CM | POA: Diagnosis not present

## 2015-12-10 DIAGNOSIS — M9908 Segmental and somatic dysfunction of rib cage: Secondary | ICD-10-CM | POA: Diagnosis not present

## 2015-12-10 NOTE — Assessment & Plan Note (Signed)
Patient presents today for evaluation of chest pain. Possibly anxiety related as she has had full cardiology work up which was negative as well as feeling better now that she is on vacation and starting the zoloft. However, I do think that there may be a myofascial component, likely acute and due to the coughing over the spring. She does have some somatic dysfunctions that I think are contributing to her symptoms and I think she would benefit from OMT. Patient had focused treatment today as discussed below. Will return next week for full evaluation and treatment.

## 2015-12-10 NOTE — Progress Notes (Signed)
BP 123/81 mmHg  Pulse 73  Temp(Src) 98.2 F (36.8 C)  Ht 5\' 1"  (1.549 m)  Wt 177 lb (80.287 kg)  BMI 33.46 kg/m2  SpO2 98%  LMP 11/14/2015 (Exact Date)   Subjective:    Patient ID: Laura Espinoza, female    DOB: [birth year], 39 y.o.   MRN: [id number]  HPI: Laura Espinoza is a 39 y.o. female  Chief Complaint  Patient presents with  . Back Pain    Patient states that she had an xray of her back done, she states that she has a sensitive spot on her spine.   CHEST PAIN- used to be every day for about 2 months, has been doing better in the past week, some days it is there all day, some days comes and goes, has under gone full work up with cardiology and it was negative. Has been really stressed and it feels better now that she has had a week off with the holiday. Does note that this spring, she had a really bad cough and was coughing for a few weeks.  Time since onset: 2 months Duration: variable Onset: sudden in October Quality: squeezing and dull Severity: moderate Location: axilla and ribs, but deep inside Radiation: left arm Episode duration:  Frequency: variable Related to exertion: no, with stress Activity when pain started: Unknown Trauma: no Anxiety/recent stressors: yes Aggravating factors: stress Alleviating factors: relaxing Status: better Treatments attempted: zoloft, APAP and ibuprofen  Current pain status: pain free Shortness of breath: yes Cough: no Nausea: no Diaphoresis: no Heartburn: a little bit, taken care of with tums Palpitations: yes  Relevant past medical, surgical, family and social history reviewed and updated as indicated. Interim medical history since our last visit reviewed. Allergies and medications reviewed and updated.  Review of Systems  Constitutional: Negative.   Respiratory: Negative.   Cardiovascular: Positive for chest pain. Negative for palpitations and leg swelling.  Gastrointestinal: Negative.   Musculoskeletal: Positive for  myalgias. Negative for back pain, joint swelling, arthralgias, gait problem, neck pain and neck stiffness.  Psychiatric/Behavioral: Negative.     Per HPI unless specifically indicated above     Objective:    BP 123/81 mmHg  Pulse 73  Temp(Src) 98.2 F (36.8 C)  Ht 5\' 1"  (1.549 m)  Wt 177 lb (80.287 kg)  BMI 33.46 kg/m2  SpO2 98%  LMP 11/14/2015 (Exact Date)  Wt Readings from Last 3 Encounters:  12/10/15 177 lb (80.287 kg)  12/09/15 174 lb (78.926 kg)  12/01/15 174 lb 12 oz (79.266 kg)    Physical Exam  Constitutional: She is oriented to person, place, and time. She appears well-developed and well-nourished. No distress.  HENT:  Head: Normocephalic and atraumatic.  Right Ear: Hearing normal.  Left Ear: Hearing normal.  Nose: Nose normal.  Eyes: Conjunctivae and lids are normal. Right eye exhibits no discharge. Left eye exhibits no discharge. No scleral icterus.  Cardiovascular: Normal rate, regular rhythm, normal heart sounds and intact distal pulses.  Exam reveals no gallop and no friction rub.   No murmur heard. Pulmonary/Chest: Effort normal and breath sounds normal. No respiratory distress. She has no wheezes. She has no rales. She exhibits no tenderness.  Neurological: She is alert and oriented to person, place, and time.  Skin: Skin is warm, dry and intact. No rash noted. No erythema. No pallor.  Psychiatric: She has a normal mood and affect. Her speech is normal and behavior is normal. Judgment and thought content normal. Cognition and  memory are normal.  Nursing note and vitals reviewed. Musculoskeletal:  Exam found Decreased ROM, Tissue texture changes, Tenderness to palpation and Asymmetry of patient's  ribs Osteopathic Structural Exam:   Ribs: Ribs 6-7 locked up on the L   Results for orders placed or performed in visit on 11/17/15  D-Dimer, Quantitative  Result Value Ref Range   D-DIMER <.20 0.00 - 0.49 mg/L FEU  Lipid Panel w/o Chol/HDL Ratio  Result Value  Ref Range   Cholesterol, Total 207 (H) 100 - 199 mg/dL   Triglycerides 153 (H) 0 - 149 mg/dL   HDL 59 >39 mg/dL   VLDL Cholesterol Cal 31 5 - 40 mg/dL   LDL Calculated 117 (H) 0 - 99 mg/dL  HIV antibody  Result Value Ref Range   HIV Screen 4th Generation wRfx Non Reactive Non Reactive  Comprehensive metabolic panel  Result Value Ref Range   Glucose 82 65 - 99 mg/dL   BUN 13 6 - 20 mg/dL   Creatinine, Ser 0.85 0.57 - 1.00 mg/dL   GFR calc non Af Amer 87 >59 mL/min/1.73   GFR calc Af Amer 101 >59 mL/min/1.73   BUN/Creatinine Ratio 15 8 - 20   Sodium 139 136 - 144 mmol/L   Potassium 5.0 3.5 - 5.2 mmol/L   Chloride 98 97 - 106 mmol/L   CO2 25 18 - 29 mmol/L   Calcium 9.8 8.7 - 10.2 mg/dL   Total Protein 7.2 6.0 - 8.5 g/dL   Albumin 4.5 3.5 - 5.5 g/dL   Globulin, Total 2.7 1.5 - 4.5 g/dL   Albumin/Globulin Ratio 1.7 1.1 - 2.5   Bilirubin Total 0.7 0.0 - 1.2 mg/dL   Alkaline Phosphatase 67 39 - 117 IU/L   AST 19 0 - 40 IU/L   ALT 15 0 - 32 IU/L  CBC with Differential/Platelet  Result Value Ref Range   WBC 5.8 3.4 - 10.8 x10E3/uL   RBC 4.92 3.77 - 5.28 x10E6/uL   Hemoglobin 15.3 11.1 - 15.9 g/dL   Hematocrit 43.6 34.0 - 46.6 %   MCV 89 79 - 97 fL   MCH 31.1 26.6 - 33.0 pg   MCHC 35.1 31.5 - 35.7 g/dL   RDW 13.7 12.3 - 15.4 %   Platelets 296 150 - 379 x10E3/uL   Neutrophils 61 %   Lymphs 30 %   Monocytes 7 %   Eos 2 %   Basos 0 %   Neutrophils Absolute 3.5 1.4 - 7.0 x10E3/uL   Lymphocytes Absolute 1.7 0.7 - 3.1 x10E3/uL   Monocytes Absolute 0.4 0.1 - 0.9 x10E3/uL   EOS (ABSOLUTE) 0.1 0.0 - 0.4 x10E3/uL   Basophils Absolute 0.0 0.0 - 0.2 x10E3/uL   Immature Granulocytes 0 %   Immature Grans (Abs) 0.0 0.0 - 0.1 x10E3/uL  Troponin I  Result Value Ref Range   Troponin I <0.01 0.00 - 0.04 ng/mL      Assessment & Plan:   Problem List Items Addressed This Visit      Other   Chest pain at rest - Primary    Patient presents today for evaluation of chest pain. Possibly  anxiety related as she has had full cardiology work up which was negative as well as feeling better now that she is on vacation and starting the zoloft. However, I do think that there may be a myofascial component, likely acute and due to the coughing over the spring. She does have some somatic dysfunctions that I think are  contributing to her symptoms and I think she would benefit from OMT. Patient had focused treatment today as discussed below. Will return next week for full evaluation and treatment.        Other Visit Diagnoses    Rib cage region somatic dysfunction          After verbal consent was obtained, patient was treated today with osteopathic manipulative medicine to the regions of the ribs using the techniques of myofascial release, HVLA and soft tissue. Areas of compensation relating to her primary pain source also treated. Patient tolerated the procedure well with good objective and good subjective improvement in symptoms. .sex left the room in good condition. He was advised to stay well hydrated and that he may have some soreness following the procedure. If not improving or worsening, he will call and come in. She will return for reevaluation  in 1-2 weeks.   Follow up plan: Return in about 1 week (around 12/17/2015) for OMM.

## 2015-12-12 HISTORY — PX: TYMPANOPLASTY WITH GRAFT: SHX6567

## 2015-12-17 ENCOUNTER — Encounter: Payer: Self-pay | Admitting: Family Medicine

## 2015-12-17 ENCOUNTER — Ambulatory Visit (INDEPENDENT_AMBULATORY_CARE_PROVIDER_SITE_OTHER): Payer: BLUE CROSS/BLUE SHIELD | Admitting: Family Medicine

## 2015-12-17 VITALS — BP 132/83 | HR 77 | Temp 98.3°F | Ht 60.5 in | Wt 175.0 lb

## 2015-12-17 DIAGNOSIS — M9901 Segmental and somatic dysfunction of cervical region: Secondary | ICD-10-CM | POA: Diagnosis not present

## 2015-12-17 DIAGNOSIS — M9908 Segmental and somatic dysfunction of rib cage: Secondary | ICD-10-CM

## 2015-12-17 DIAGNOSIS — M9900 Segmental and somatic dysfunction of head region: Secondary | ICD-10-CM | POA: Diagnosis not present

## 2015-12-17 DIAGNOSIS — R079 Chest pain, unspecified: Secondary | ICD-10-CM

## 2015-12-17 DIAGNOSIS — M9902 Segmental and somatic dysfunction of thoracic region: Secondary | ICD-10-CM | POA: Diagnosis not present

## 2015-12-17 DIAGNOSIS — M9903 Segmental and somatic dysfunction of lumbar region: Secondary | ICD-10-CM | POA: Diagnosis not present

## 2015-12-17 DIAGNOSIS — M9905 Segmental and somatic dysfunction of pelvic region: Secondary | ICD-10-CM

## 2015-12-17 NOTE — Progress Notes (Signed)
BP 132/83 mmHg  Pulse 77  Temp(Src) 98.3 F (36.8 C)  Ht 5' 0.5" (1.537 m)  Wt 175 lb (79.379 kg)  BMI 33.60 kg/m2  SpO2 99%  LMP 12/10/2015   Subjective:    Patient ID: Laura Espinoza, female    DOB: April 03, 1976, 40 y.o.   MRN: [id number]  HPI: Laura Espinoza is a 40 y.o. female  Chief Complaint  Patient presents with  . OMM   CHEST PAIN- feels like she is doing much better since her appointment last week. She felt like immediately after her treatment last week she was able to breathe better and she has been feeling much better. She notes that she has had 2 instances of chest pain associated with stress, but otherwise she has been feeling much better. She thinks that the rib issue was the main cause of her pain. She is otherwise feeling well with no other concerns or complaints at this time.  Duration: 2 months Onset: sudden in october Quality: squeezing and dull Severity: moderate Location: axilla and ribs, but deep inside Radiation: left arm Episode duration:  Frequency: variable Related to exertion: no, with stress Activity when pain started: Unknown Trauma: no Anxiety/recent stressors: yes Aggravating factors: stress Alleviating factors: relaxing Status: better Treatments attempted: zoloft, APAP and ibuprofen  Current pain status: pain free Shortness of breath: yes Cough: no Nausea: no Diaphoresis: no Heartburn: a little bit, taken care of with tums Palpitations: yes   Relevant past medical, surgical, family and social history reviewed and updated as indicated. Interim medical history since our last visit reviewed. Allergies and medications reviewed and updated.  Review of Systems  Constitutional: Negative.   Respiratory: Negative.   Cardiovascular: Negative.   Musculoskeletal: Negative.   Psychiatric/Behavioral: Negative.     Per HPI unless specifically indicated above     Objective:    BP 132/83 mmHg  Pulse 77  Temp(Src) 98.3 F (36.8 C)  Ht 5'  0.5" (1.537 m)  Wt 175 lb (79.379 kg)  BMI 33.60 kg/m2  SpO2 99%  LMP 12/10/2015  Wt Readings from Last 3 Encounters:  12/17/15 175 lb (79.379 kg)  12/10/15 177 lb (80.287 kg)  12/09/15 174 lb (78.926 kg)    Physical Exam  Constitutional: She is oriented to person, place, and time. She appears well-developed and well-nourished. No distress.  HENT:  Head: Normocephalic and atraumatic.  Right Ear: Hearing normal.  Left Ear: Hearing normal.  Nose: Nose normal.  Eyes: Conjunctivae and lids are normal. Right eye exhibits no discharge. Left eye exhibits no discharge. No scleral icterus.  Cardiovascular:  No murmur heard. Pulmonary/Chest: Effort normal. No respiratory distress.  Abdominal: Soft. She exhibits no distension and no mass. There is no tenderness. There is no rebound and no guarding.  Neurological: She is alert and oriented to person, place, and time.  Skin: Skin is warm, dry and intact. No rash noted. No erythema. No pallor.  Psychiatric: She has a normal mood and affect. Her speech is normal and behavior is normal. Judgment and thought content normal. Cognition and memory are normal.  Nursing note and vitals reviewed. Musculoskeletal:  Exam found Decreased ROM, Tissue texture changes, Tenderness to palpation and Asymmetry of patient's  head, neck, thorax, ribs, lumbar and pelvis Osteopathic Structural Exam:   Head: hypertonic suboccipital muscles, OAESSL   Neck: hypertonic traps bilaterally C4ESRR  Thorax: T3-5SRRL, T6ESR  Ribs: Ribs 5-7 locked up on the L  Lumbar: QL hypertonic on the L  Pelvis: Posterior  L innominate   Results for orders placed or performed in visit on 11/17/15  D-Dimer, Quantitative  Result Value Ref Range   D-DIMER <.20 0.00 - 0.49 mg/L FEU  Lipid Panel w/o Chol/HDL Ratio  Result Value Ref Range   Cholesterol, Total 207 (H) 100 - 199 mg/dL   Triglycerides 153 (H) 0 - 149 mg/dL   HDL 59 >39 mg/dL   VLDL Cholesterol Cal 31 5 - 40 mg/dL   LDL  Calculated 117 (H) 0 - 99 mg/dL  HIV antibody  Result Value Ref Range   HIV Screen 4th Generation wRfx Non Reactive Non Reactive  Comprehensive metabolic panel  Result Value Ref Range   Glucose 82 65 - 99 mg/dL   BUN 13 6 - 20 mg/dL   Creatinine, Ser 0.85 0.57 - 1.00 mg/dL   GFR calc non Af Amer 87 >59 mL/min/1.73   GFR calc Af Amer 101 >59 mL/min/1.73   BUN/Creatinine Ratio 15 8 - 20   Sodium 139 136 - 144 mmol/L   Potassium 5.0 3.5 - 5.2 mmol/L   Chloride 98 97 - 106 mmol/L   CO2 25 18 - 29 mmol/L   Calcium 9.8 8.7 - 10.2 mg/dL   Total Protein 7.2 6.0 - 8.5 g/dL   Albumin 4.5 3.5 - 5.5 g/dL   Globulin, Total 2.7 1.5 - 4.5 g/dL   Albumin/Globulin Ratio 1.7 1.1 - 2.5   Bilirubin Total 0.7 0.0 - 1.2 mg/dL   Alkaline Phosphatase 67 39 - 117 IU/L   AST 19 0 - 40 IU/L   ALT 15 0 - 32 IU/L  CBC with Differential/Platelet  Result Value Ref Range   WBC 5.8 3.4 - 10.8 x10E3/uL   RBC 4.92 3.77 - 5.28 x10E6/uL   Hemoglobin 15.3 11.1 - 15.9 g/dL   Hematocrit 43.6 34.0 - 46.6 %   MCV 89 79 - 97 fL   MCH 31.1 26.6 - 33.0 pg   MCHC 35.1 31.5 - 35.7 g/dL   RDW 13.7 12.3 - 15.4 %   Platelets 296 150 - 379 x10E3/uL   Neutrophils 61 %   Lymphs 30 %   Monocytes 7 %   Eos 2 %   Basos 0 %   Neutrophils Absolute 3.5 1.4 - 7.0 x10E3/uL   Lymphocytes Absolute 1.7 0.7 - 3.1 x10E3/uL   Monocytes Absolute 0.4 0.1 - 0.9 x10E3/uL   EOS (ABSOLUTE) 0.1 0.0 - 0.4 x10E3/uL   Basophils Absolute 0.0 0.0 - 0.2 x10E3/uL   Immature Granulocytes 0 %   Immature Grans (Abs) 0.0 0.0 - 0.1 x10E3/uL  Troponin I  Result Value Ref Range   Troponin I <0.01 0.00 - 0.04 ng/mL      Assessment & Plan:   Problem List Items Addressed This Visit      Other   Chest pain at rest - Primary    Other Visit Diagnoses    Rib cage region somatic dysfunction        Cranial somatic dysfunction        Cervical segment dysfunction        Thoracic region somatic dysfunction        Lumbar region somatic dysfunction         Pelvic region somatic dysfunction          After verbal consent was obtained, patient was treated today with osteopathic manipulative medicine to the regions of the head, neck, thorax, ribs, lumbar and pelvis using the techniques of myofascial release, counterstrain, muscle  energy, HVLA and soft tissue. Areas of compensation relating to her primary pain source also treated. Patient tolerated the procedure well with good objective and good subjective improvement in symptoms. She left the room in good condition. She was advised to stay well hydrated and that she may have some soreness following the procedure. If not improving or worsening, she will call and come in. Home exercise program of stretches for traps discussed and demonstrated today. Patient will do these stretches BID to before the point of pain, and will return for reevaluation  on a PRN basis.   Follow up plan: Return if symptoms worsen or fail to improve.

## 2015-12-20 ENCOUNTER — Other Ambulatory Visit: Payer: BLUE CROSS/BLUE SHIELD

## 2016-01-06 ENCOUNTER — Other Ambulatory Visit: Payer: BLUE CROSS/BLUE SHIELD

## 2016-01-06 ENCOUNTER — Ambulatory Visit (INDEPENDENT_AMBULATORY_CARE_PROVIDER_SITE_OTHER): Payer: BLUE CROSS/BLUE SHIELD

## 2016-01-06 ENCOUNTER — Other Ambulatory Visit: Payer: Self-pay

## 2016-01-06 DIAGNOSIS — R079 Chest pain, unspecified: Secondary | ICD-10-CM

## 2016-01-06 DIAGNOSIS — R0602 Shortness of breath: Secondary | ICD-10-CM

## 2016-01-06 LAB — EXERCISE TOLERANCE TEST
CHL CUP MPHR: 182 {beats}/min
CSEPEDS: 0 s
CSEPEW: 10.1 METS
CSEPPHR: 169 {beats}/min
Exercise duration (min): 8 min
Percent HR: 92 %
Rest HR: 89 {beats}/min

## 2016-01-07 ENCOUNTER — Other Ambulatory Visit: Payer: BLUE CROSS/BLUE SHIELD

## 2016-01-21 ENCOUNTER — Ambulatory Visit: Payer: BLUE CROSS/BLUE SHIELD | Admitting: Cardiovascular Disease

## 2016-02-29 ENCOUNTER — Telehealth: Payer: Self-pay

## 2016-02-29 MED ORDER — OSELTAMIVIR PHOSPHATE 75 MG PO CAPS: 75.0000 mg | ORAL_CAPSULE | Freq: Two times a day (BID) | ORAL | Status: AC

## 2016-02-29 NOTE — Telephone Encounter (Signed)
Called patient and advised that RX was sent to [facility] as requested.

## 2016-02-29 NOTE — Telephone Encounter (Signed)
Patient states that her son has been diagnosed with the flu and that she is now having flu symptoms too.  Patient would like to know if you would send an RX for Tamilflu in to [facility] on [address].  Please call patient at [phone].

## 2016-02-29 NOTE — Telephone Encounter (Signed)
Yes, by all means I just sent in Rx as requested We hope she and her son start feeling better soon

## 2016-03-09 ENCOUNTER — Ambulatory Visit: Payer: BLUE CROSS/BLUE SHIELD | Admitting: Family Medicine

## 2016-03-09 ENCOUNTER — Other Ambulatory Visit: Payer: Self-pay | Admitting: Family Medicine

## 2016-03-09 NOTE — Telephone Encounter (Signed)
rx approved

## 2016-03-16 ENCOUNTER — Ambulatory Visit (INDEPENDENT_AMBULATORY_CARE_PROVIDER_SITE_OTHER): Payer: BLUE CROSS/BLUE SHIELD | Admitting: Family Medicine

## 2016-03-16 ENCOUNTER — Encounter: Payer: Self-pay | Admitting: Family Medicine

## 2016-03-16 VITALS — BP 130/78 | HR 82 | Temp 98.3°F | Resp 14 | Wt 176.0 lb

## 2016-03-16 DIAGNOSIS — M546 Pain in thoracic spine: Secondary | ICD-10-CM

## 2016-03-16 DIAGNOSIS — Z23 Encounter for immunization: Secondary | ICD-10-CM

## 2016-03-16 DIAGNOSIS — B181 Chronic viral hepatitis B without delta-agent: Secondary | ICD-10-CM | POA: Diagnosis not present

## 2016-03-16 DIAGNOSIS — F419 Anxiety disorder, unspecified: Secondary | ICD-10-CM

## 2016-03-16 DIAGNOSIS — H7290 Unspecified perforation of tympanic membrane, unspecified ear: Secondary | ICD-10-CM | POA: Diagnosis not present

## 2016-03-16 MED ORDER — BECLOMETHASONE DIPROPIONATE 40 MCG/ACT IN AERS: 2.0000 | INHALATION_SPRAY | Freq: Two times a day (BID) | RESPIRATORY_TRACT | Status: DC

## 2016-03-16 MED ORDER — ALBUTEROL SULFATE HFA 108 (90 BASE) MCG/ACT IN AERS: 2.0000 | INHALATION_SPRAY | RESPIRATORY_TRACT | Status: DC | PRN

## 2016-03-16 NOTE — Progress Notes (Signed)
BP 130/78 mmHg  Pulse 82  Temp(Src) 98.3 F (36.8 C) (Oral)  Resp 14  Wt 176 lb (79.833 kg)  SpO2 97%  LMP 02/23/2016 (Approximate)   Subjective:    Patient ID: Laura Espinoza, female    DOB: 07-Sep-1976, 40 y.o.   MRN: [id number]  HPI: Laura Espinoza is a 40 y.o. female  Chief Complaint  Patient presents with  . Medication Refill  . Depression  . Immunizations    GI states patient is not immunce to hep A, needs vaccine  . Labs Only    GI also would like to get Liver function panel    She feels like the zoloft is working well; helped with anxiety; feels better now; energy level affected by recent illness; Sasha had flu; sleeping okay  She saw Dr. Wynetta Emery, two of her ribs were dislocated; Dr. Wynetta Emery did the OMM treatment; she also saw cardiologist too and had treadmill stress test; anxiety played a part  GI says that they recommend she have the hep A vaccine; she would like liver function panel today; liver US was normal Dec 23rd; no abd pain; reveiwed [facility] labs  She saw the ENT, Dr. Tami Ribas; she was referred to a subspecialist in [facility]; he thinks he can fix her perforated ear drum; surgery in May  Relevant past medical, surgical, family and social history reviewed and updated as indicated. Interim medical history since our last visit reviewed. Allergies and medications reviewed and updated.  Review of Systems  Per HPI unless specifically indicated above     Objective:    BP 130/78 mmHg  Pulse 82  Temp(Src) 98.3 F (36.8 C) (Oral)  Resp 14  Wt 176 lb (79.833 kg)  SpO2 97%  LMP 02/23/2016 (Approximate)  Wt Readings from Last 3 Encounters:  03/16/16 176 lb (79.833 kg)  12/17/15 175 lb (79.379 kg)  12/10/15 177 lb (80.287 kg)    Physical Exam  Constitutional: She appears well-developed and well-nourished. No distress.  HENT:  Head: Normocephalic and atraumatic.  Eyes: EOM are normal. No scleral icterus.  Neck: No thyromegaly present.  Cardiovascular:  Normal rate, regular rhythm and normal heart sounds.   No murmur heard. Pulmonary/Chest: Effort normal and breath sounds normal. No respiratory distress. She has no wheezes.  Abdominal: Soft. Bowel sounds are normal. She exhibits no distension.  Musculoskeletal: Normal range of motion. She exhibits no edema.  Neurological: She is alert. She exhibits normal muscle tone.  Skin: Skin is warm and dry. She is not diaphoretic. No pallor.  Psychiatric: She has a normal mood and affect. Her behavior is normal. Judgment and thought content normal.      Assessment & Plan:   Problem List Items Addressed This Visit      Digestive   HBV (hepatitis B virus) infection - Primary    Order liver function tests; followed by GI; hep A vaccine      Relevant Orders   Hepatic function panel (Completed)     Nervous and Auditory   Perforated tympanic membrane    Has seen ENT locally; she has been referred to specialist in [place]; glad to hear she has some hope for repair and possibly some restoration of hearing        Other   Anxiety    ddoing well on current dose of SSRI; continue for 6-9 months, then can consider stopping if desired later in the summer; she can call and we'll discuss taper then  Thoracic back pain    Related to dislocated ribs and anxiety; completed OMM and had good response; negative stress test, checked out by cardiology       Other Visit Diagnoses    Need for hepatitis A immunization        Relevant Orders    Hepatitis A vaccine adult IM (Completed)       Follow up plan: Return in about 6 months (around 09/15/2016) for visit with Dr. Sanda Klein and 2nd hepatitis A vaccine.  An after-visit summary was printed and given to the patient at [facility].  Please see the patient instructions which may contain other information and recommendations beyond what is mentioned above in the assessment and plan.  Orders Placed This Encounter  Procedures  . Hepatitis A vaccine adult IM    . Hepatic function panel

## 2016-03-16 NOTE — Patient Instructions (Addendum)
You've received the hepatitis A vaccine today; return in 6 months for your 2nd and final shot Call in July or August if you would like to try stopping the sertraline I'll see you back in early October  Hepatitis A Vaccine: What You Need to Know 1. Why get vaccinated? Hepatitis A is a serious liver disease. It is caused by the hepatitis A virus (HAV). HAV is spread from person to person through contact with the feces (stool) of people who are infected, which can easily happen if someone does not wash his or her hands properly. You can also get hepatitis A from food, water, or objects contaminated with HAV. Symptoms of hepatitis A can include:  fever, fatigue, loss of appetite, nausea, vomiting, and/or joint pain  severe stomach pains and diarrhea (mainly in children), or  jaundice (yellow skin or eyes, dark urine, clay-colored bowel movements). These symptoms usually appear 2 to 6 weeks after exposure and usually last less than 2 months, although some people can be ill for as long as 6 months. If you have hepatitis A you may be too ill to work. Children often do not have symptoms, but most adults do. You can spread HAV without having symptoms. Hepatitis A can cause liver failure and death, although this is rare and occurs more commonly in persons 41 years of age or older and persons with other liver diseases, such as hepatitis B or C. Hepatitis A vaccine can prevent hepatitis A. Hepatitis A vaccines were recommended in the [place] beginning in 1996. Since then, the number of cases reported each year in the [place] has dropped from around 31,000 cases to fewer than 1,500 cases. 2. Hepatitis A vaccine Hepatitis A vaccine is an inactivated (killed) vaccine. You will need 2 doses for long-lasting protection. These doses should be given at least 6 months apart. Children are routinely vaccinated between their first and second birthdays (58 through 12 months of age). Older children and adolescents can  get the vaccine after 23 months. Adults who have not been vaccinated previously and want to be protected against hepatitis A can also get the vaccine. You should get hepatitis A vaccine if you:  are traveling to countries where hepatitis A is common,  are a man who has sex with other men,  use illegal drugs,  have a chronic liver disease such as hepatitis B or hepatitis C,  are being treated with clotting-factor concentrates,  work with hepatitis A-infected animals or in a hepatitis A research laboratory, or  expect to have close personal contact with an international adoptee from a country where hepatitis A is common Ask your healthcare provider if you want more information about any of these groups. There are no known risks to getting hepatitis A vaccine at the same time as other vaccines. 3. Some people should not get this vaccine Tell the person who is giving you the vaccine:  If you have any severe, life-threatening allergies. If you ever had a life-threatening allergic reaction after a dose of hepatitis A vaccine, or have a severe allergy to any part of this vaccine, you may be advised not to get vaccinated. Ask your health care provider if you want information about vaccine components.  If you are not feeling well. If you have a mild illness, such as a cold, you can probably get the vaccine today. If you are moderately or severely ill, you should probably wait until you recover. Your doctor can advise you. 4. Risks of a vaccine  reaction With any medicine, including vaccines, there is a chance of side effects. These are usually mild and go away on their own, but serious reactions are also possible. Most people who get hepatitis A vaccine do not have any problems with it. Minor problems following hepatitis A vaccine include:  soreness or redness where the shot was given  low-grade fever  headache  tiredness If these problems occur, they usually begin soon after the shot and  last 1 or 2 days. Your doctor can tell you more about these reactions. Other problems that could happen after this vaccine:  People sometimes faint after a medical procedure, including vaccination. Sitting or lying down for about 15 minutes can help prevent fainting, and injuries caused by a fall. Tell your provider if you feel dizzy, or have vision changes or ringing in the ears.  Some people get shoulder pain that can be more severe and longer lasting than the more routine soreness that can follow injections. This happens very rarely.  Any medication can cause a severe allergic reaction. Such reactions from a vaccine are very rare, estimated at about 1 in a million doses, and would happen within a few minutes to a few hours after the vaccination. As with any medicine, there is a very remote chance of a vaccine causing a serious injury or death. The safety of vaccines is always being monitored. For more information, visit: http://www.aguilar.org/ 5. What if there is a serious problem? What should I look for?  Look for anything that concerns you, such as signs of a severe allergic reaction, very high fever, or unusual behavior. Signs of a severe allergic reaction can include hives, swelling of the face and throat, difficulty breathing, a fast heartbeat, dizziness, and weakness. These would start a few minutes to a few hours after the vaccination. What should I do?  If you think it is a severe allergic reaction or other emergency that can't wait, call 9-1-1 or get to the nearest hospital. Otherwise, call your clinic. Afterward, the reaction should be reported to the Vaccine Adverse Event Reporting System (VAERS). Your doctor should file this report, or you can do it yourself through the VAERS web site at www.vaers.SamedayNews.es, or by calling 212-404-5021. VAERS does not give medical advice. 6. The National Vaccine Injury Compensation Program The [employer] Vaccine Injury Compensation Program  (VICP) is a federal program that was created to compensate people who may have been injured by certain vaccines. Persons who believe they may have been injured by a vaccine can learn about the program and about filing a claim by calling 240-228-5202 or visiting the [place] website at GoldCloset.com.ee. There is a time limit to file a claim for compensation. 7. How can I learn more?  Ask your healthcare provider. He or she can give you the vaccine package insert or suggest other sources of information.  Call your local or state health department.  Contact the Centers for Disease Control and Prevention (CDC):  Call (365)082-2206 (1-800-CDC-INFO) or  Visit CDC's website at http://hunter.com/ CDC Hepatitis A Vaccine VIS (06/30/2015)   This information is not intended to replace advice given to you by your health care provider. Make sure you discuss any questions you have with your health care provider.   Document Released: 09/21/2006 Document Revised: 08/18/2015 Document Reviewed: 07/17/2015 Elsevier Interactive Patient Education [employer].

## 2016-03-16 NOTE — Assessment & Plan Note (Addendum)
Order liver function tests; followed by GI; hep A vaccine

## 2016-03-17 LAB — HEPATIC FUNCTION PANEL
ALBUMIN: 4.8 g/dL (ref 3.5–5.5)
ALT: 19 IU/L (ref 0–32)
AST: 20 IU/L (ref 0–40)
Alkaline Phosphatase: 71 IU/L (ref 39–117)
BILIRUBIN TOTAL: 0.5 mg/dL (ref 0.0–1.2)
BILIRUBIN, DIRECT: 0.14 mg/dL (ref 0.00–0.40)
Total Protein: 7.5 g/dL (ref 6.0–8.5)

## 2016-04-09 DIAGNOSIS — H7290 Unspecified perforation of tympanic membrane, unspecified ear: Secondary | ICD-10-CM | POA: Insufficient documentation

## 2016-04-09 NOTE — Assessment & Plan Note (Signed)
Has seen ENT locally; she has been referred to specialist in [place]; glad to hear she has some hope for repair and possibly some restoration of hearing

## 2016-04-09 NOTE — Assessment & Plan Note (Addendum)
ddoing well on current dose of SSRI; continue for 6-9 months, then can consider stopping if desired later in the summer; she can call and we'll discuss taper then

## 2016-04-09 NOTE — Assessment & Plan Note (Signed)
Related to dislocated ribs and anxiety; completed OMM and had good response; negative stress test, checked out by cardiology

## 2016-04-14 ENCOUNTER — Other Ambulatory Visit: Payer: Self-pay | Admitting: Urgent Care

## 2016-04-14 DIAGNOSIS — R221 Localized swelling, mass and lump, neck: Secondary | ICD-10-CM

## 2016-04-18 ENCOUNTER — Ambulatory Visit
Admission: RE | Admit: 2016-04-18 | Discharge: 2016-04-18 | Disposition: A | Discharge status: Home / Self Care | Payer: BLUE CROSS/BLUE SHIELD | Source: Ambulatory Visit | Attending: Urgent Care | Admitting: Urgent Care

## 2016-04-18 DIAGNOSIS — R221 Localized swelling, mass and lump, neck: Secondary | ICD-10-CM

## 2016-04-18 DIAGNOSIS — R1310 Dysphagia, unspecified: Secondary | ICD-10-CM | POA: Insufficient documentation

## 2016-06-06 ENCOUNTER — Other Ambulatory Visit: Payer: Self-pay | Admitting: Gastroenterology

## 2016-06-06 DIAGNOSIS — B181 Chronic viral hepatitis B without delta-agent: Secondary | ICD-10-CM

## 2016-06-09 ENCOUNTER — Ambulatory Visit: Payer: BLUE CROSS/BLUE SHIELD

## 2016-06-15 ENCOUNTER — Ambulatory Visit
Admission: RE | Admit: 2016-06-15 | Discharge: 2016-06-15 | Disposition: A | Discharge status: Home / Self Care | Payer: BLUE CROSS/BLUE SHIELD | Source: Ambulatory Visit | Attending: Gastroenterology | Admitting: Gastroenterology

## 2016-06-15 DIAGNOSIS — R932 Abnormal findings on diagnostic imaging of liver and biliary tract: Secondary | ICD-10-CM | POA: Insufficient documentation

## 2016-06-15 DIAGNOSIS — B181 Chronic viral hepatitis B without delta-agent: Secondary | ICD-10-CM | POA: Diagnosis present

## 2016-09-15 ENCOUNTER — Encounter: Payer: Self-pay | Admitting: Family Medicine

## 2016-09-15 ENCOUNTER — Ambulatory Visit (INDEPENDENT_AMBULATORY_CARE_PROVIDER_SITE_OTHER): Payer: BLUE CROSS/BLUE SHIELD | Admitting: Family Medicine

## 2016-09-15 VITALS — BP 122/74 | HR 96 | Temp 97.7°F | Resp 14 | Wt 180.0 lb

## 2016-09-15 DIAGNOSIS — N644 Mastodynia: Secondary | ICD-10-CM | POA: Diagnosis not present

## 2016-09-15 DIAGNOSIS — H7290 Unspecified perforation of tympanic membrane, unspecified ear: Secondary | ICD-10-CM | POA: Diagnosis not present

## 2016-09-15 DIAGNOSIS — B181 Chronic viral hepatitis B without delta-agent: Secondary | ICD-10-CM

## 2016-09-15 DIAGNOSIS — N6320 Unspecified lump in the left breast, unspecified quadrant: Secondary | ICD-10-CM | POA: Diagnosis not present

## 2016-09-15 DIAGNOSIS — Z23 Encounter for immunization: Secondary | ICD-10-CM | POA: Insufficient documentation

## 2016-09-15 DIAGNOSIS — N6325 Unspecified lump in the left breast, overlapping quadrants: Secondary | ICD-10-CM | POA: Insufficient documentation

## 2016-09-15 DIAGNOSIS — J4520 Mild intermittent asthma, uncomplicated: Secondary | ICD-10-CM

## 2016-09-15 DIAGNOSIS — F419 Anxiety disorder, unspecified: Secondary | ICD-10-CM

## 2016-09-15 DIAGNOSIS — J45909 Unspecified asthma, uncomplicated: Secondary | ICD-10-CM | POA: Insufficient documentation

## 2016-09-15 NOTE — Assessment & Plan Note (Signed)
Order mammo and [place]

## 2016-09-15 NOTE — Patient Instructions (Addendum)
We'll get the ultrasound and mammogram You received the flu shot today; it should protect you against the flu virus over the coming months; it will take about two weeks for antibodies to develop; do try to stay away from hospitals, nursing homes, and daycares during peak flu season; taking extra vitamin C daily during flu season may help you avoid getting sick Please do see your gynecologist for a physical

## 2016-09-15 NOTE — Assessment & Plan Note (Signed)
Vaccination today

## 2016-09-15 NOTE — Assessment & Plan Note (Signed)
Lateral right breast; will get mammo and [place]

## 2016-09-15 NOTE — Progress Notes (Signed)
BP 122/74   Pulse 96   Temp 97.7 F (36.5 C) (Oral)   Resp 14   Wt 180 lb (81.6 kg)   LMP 08/21/2016   SpO2 98%   BMI 34.58 kg/m    Subjective:    Patient ID: Laura Espinoza, female    DOB: 1976/07/28, 40 y.o.   MRN: [id number]  HPI: Laura Espinoza is a 40 y.o. female  Chief Complaint  Patient presents with  . Follow-up   Here for 6 month follow-up  Asthma; breathing is doing well[place]; cooler temperature, now in the fall; last use of rescue inhaler was in June after getting sick after surgery; flu shot today  Since last visit, had ear surgery; [place], [employer]; one more surgery planned; already had drum repaired; not much improvement in hearing yet; Dr. Ned Grace is her ENT  Not taking sertraline any more; energy is back; sleeping well  She has pain in right breast; past few days getting worse; no redness, she checked, no lumps; no nipple discharge  Depression screen [facility] 2/9 09/15/2016 03/16/2016 11/17/2015  Decreased Interest 0 0 0  Down, Depressed, Hopeless 0 0 0  PHQ - 2 Score 0 0 0   Relevant past medical, surgical, family and social history reviewed Past Medical History:  Diagnosis Date  . Allergic rhinitis   . Anxiety   . Gastritis   . OM (otitis media), recurrent    as a child; ruptured TM  . Pneumonia 2007   hospitalized for possible pneumonia in [place] for 2 weeks  . Recurrent sinusitis    Past Surgical History:  Procedure Laterality Date  . fallopian tube removal Left March 2014  . LAPAROSCOPIC OVARIAN CYSTECTOMY  2008   in [place]  . REFRACTIVE SURGERY Bilateral 1998  . TYMPANOPLASTY WITH GRAFT Left 2017   [place], Dr. Ned Grace   Family History  Problem Relation Age of Onset  . Hypertension Mother   . Heart disease Mother   . CAD Mother   . Heart attack Mother   . Arthritis Mother   . CAD Father   . Heart disease Father   . Glaucoma Father   . Cancer Father     kidney    . Hypertension Brother   . COPD Neg Hx   . Diabetes Neg Hx   . Stroke Neg Hx    Social History  Substance Use Topics  . Smoking status: Never Smoker  . Smokeless tobacco: Never Used  . Alcohol use Yes     Comment: occasional   Interim medical history since last visit reviewed. Allergies and medications reviewed  Review of Systems Per HPI unless specifically indicated above     Objective:    BP 122/74   Pulse 96   Temp 97.7 F (36.5 C) (Oral)   Resp 14   Wt 180 lb (81.6 kg)   LMP 08/21/2016   SpO2 98%   BMI 34.58 kg/m   Wt Readings from Last 3 Encounters:  09/15/16 180 lb (81.6 kg)  03/16/16 176 lb (79.8 kg)  12/17/15 175 lb (79.4 kg)    Physical Exam  Constitutional: She appears well-developed and well-nourished. No distress.  Eyes: EOM are normal. No scleral icterus.  Neck: No thyromegaly present.  Cardiovascular: Normal rate and regular rhythm.   Pulmonary/Chest: Effort normal and breath sounds normal. She has no wheezes. Right breast exhibits mass and tenderness. Right breast exhibits no inverted  nipple, no nipple discharge and no skin change. Left breast exhibits no inverted nipple, no mass, no nipple discharge, no skin change and no tenderness.  Tender below the nipple, as well as 9 o'clock position and 11 o'clock position RIGHT breast  Abdominal: She exhibits no distension.  Skin: She is not diaphoretic. No pallor.  Psychiatric: She has a normal mood and affect. Her behavior is normal. Judgment and thought content normal. Her mood appears not anxious. She does not exhibit a depressed mood.   Results for orders placed or performed in visit on 03/16/16  Hepatic function panel  Result Value Ref Range   Total Protein 7.5 6.0 - 8.5 g/dL   Albumin 4.8 3.5 - 5.5 g/dL   Bilirubin Total 0.5 0.0 - 1.2 mg/dL   Bilirubin, Direct 0.14 0.00 - 0.40 mg/dL   Alkaline Phosphatase 71 39 - 117 IU/L   AST 20 0 - 40 IU/L   ALT 19 0 - 32 IU/L      Assessment & Plan:    Problem List Items Addressed This Visit      Respiratory   Asthma (Chronic)    Continue QVAR; well-controlled        Digestive   HBV (hepatitis B virus) infection (Chronic)    Followed by GI; hep A vaccine today        Nervous and Auditory   Perforated tympanic membrane    S/p surgery by [facility] in [facility] with plans for further surgery        Other   Need for hepatitis A vaccination    Vaccination today      Breast pain in female - Primary    Lateral right breast; will get mammo and [place]      Relevant Orders   US BREAST LTD UNI LEFT INC AXILLA   Breast lump on left side at 9 o'clock position    Order mammo and [place]      Relevant Orders   MM Digital Diagnostic Bilat   US BREAST LTD UNI RIGHT INC AXILLA   US BREAST LTD UNI LEFT INC AXILLA   Anxiety    Resolved; off of sertraline now; sleeping well       Other Visit Diagnoses    Needs flu shot       Relevant Orders   Flu Vaccine QUAD 36+ mos PF IM (Fluarix & Fluzone Quad PF) (Completed)   Need for vaccination against hepatitis A       Relevant Orders   Hepatitis A vaccine adult IM (Completed)      Follow up plan: Return in about 1 year (around 09/15/2017) for follow-up.  An after-visit summary was printed and given to the patient at [place].  Please see the patient instructions which may contain other information and recommendations beyond what is mentioned above in the assessment and plan.  No orders of the defined types were placed in this encounter.   Orders Placed This Encounter  Procedures  . MM Digital Diagnostic Bilat  . US BREAST LTD UNI RIGHT INC AXILLA  . US BREAST LTD UNI LEFT INC AXILLA  . Flu Vaccine QUAD 36+ mos PF IM (Fluarix & Fluzone Quad PF)  . Hepatitis A vaccine adult IM

## 2016-09-15 NOTE — Assessment & Plan Note (Signed)
Continue QVAR; well-controlled

## 2016-09-19 NOTE — Assessment & Plan Note (Signed)
Followed by GI; hep A vaccine today

## 2016-09-19 NOTE — Assessment & Plan Note (Signed)
Resolved; off of sertraline now; sleeping well

## 2016-09-19 NOTE — Assessment & Plan Note (Signed)
S/p surgery by ENT in [facility] with plans for further surgery

## 2016-10-02 ENCOUNTER — Ambulatory Visit: Admission: RE | Admit: 2016-10-02 | Payer: BLUE CROSS/BLUE SHIELD | Source: Ambulatory Visit

## 2016-10-02 ENCOUNTER — Ambulatory Visit
Admission: RE | Admit: 2016-10-02 | Discharge: 2016-10-02 | Disposition: A | Discharge status: Home / Self Care | Payer: BLUE CROSS/BLUE SHIELD | Source: Ambulatory Visit | Attending: Family Medicine | Admitting: Family Medicine

## 2016-10-02 DIAGNOSIS — N6320 Unspecified lump in the left breast, unspecified quadrant: Secondary | ICD-10-CM | POA: Insufficient documentation

## 2016-10-02 DIAGNOSIS — N6489 Other specified disorders of breast: Secondary | ICD-10-CM | POA: Diagnosis not present

## 2016-10-02 DIAGNOSIS — R928 Other abnormal and inconclusive findings on diagnostic imaging of breast: Secondary | ICD-10-CM | POA: Diagnosis not present

## 2016-10-16 DIAGNOSIS — H9012 Conductive hearing loss, unilateral, left ear, with unrestricted hearing on the contralateral side: Secondary | ICD-10-CM | POA: Diagnosis not present

## 2016-10-18 DIAGNOSIS — H6622 Chronic atticoantral suppurative otitis media, left ear: Secondary | ICD-10-CM | POA: Diagnosis not present

## 2016-10-18 DIAGNOSIS — H6692 Otitis media, unspecified, left ear: Secondary | ICD-10-CM | POA: Diagnosis not present

## 2016-11-28 ENCOUNTER — Other Ambulatory Visit: Payer: Self-pay | Admitting: Gastroenterology

## 2016-11-28 DIAGNOSIS — B181 Chronic viral hepatitis B without delta-agent: Secondary | ICD-10-CM

## 2016-12-01 ENCOUNTER — Ambulatory Visit
Admission: RE | Admit: 2016-12-01 | Discharge: 2016-12-01 | Disposition: A | Discharge status: Home / Self Care | Payer: BLUE CROSS/BLUE SHIELD | Source: Ambulatory Visit | Attending: Gastroenterology | Admitting: Gastroenterology

## 2016-12-01 DIAGNOSIS — B181 Chronic viral hepatitis B without delta-agent: Secondary | ICD-10-CM | POA: Insufficient documentation

## 2016-12-01 DIAGNOSIS — B199 Unspecified viral hepatitis without hepatic coma: Secondary | ICD-10-CM | POA: Diagnosis not present

## 2017-01-12 DIAGNOSIS — H9012 Conductive hearing loss, unilateral, left ear, with unrestricted hearing on the contralateral side: Secondary | ICD-10-CM | POA: Diagnosis not present

## 2017-06-26 ENCOUNTER — Other Ambulatory Visit: Payer: Self-pay | Admitting: Gastroenterology

## 2017-06-26 DIAGNOSIS — B181 Chronic viral hepatitis B without delta-agent: Secondary | ICD-10-CM | POA: Diagnosis not present

## 2017-06-29 ENCOUNTER — Ambulatory Visit: Payer: BLUE CROSS/BLUE SHIELD

## 2017-07-05 ENCOUNTER — Ambulatory Visit: Payer: BLUE CROSS/BLUE SHIELD

## 2017-08-02 ENCOUNTER — Ambulatory Visit
Admission: RE | Admit: 2017-08-02 | Discharge: 2017-08-02 | Disposition: A | Discharge status: Home / Self Care | Payer: BLUE CROSS/BLUE SHIELD | Source: Ambulatory Visit | Attending: Gastroenterology | Admitting: Gastroenterology

## 2017-08-02 DIAGNOSIS — B181 Chronic viral hepatitis B without delta-agent: Secondary | ICD-10-CM | POA: Diagnosis not present

## 2017-08-20 DIAGNOSIS — B181 Chronic viral hepatitis B without delta-agent: Secondary | ICD-10-CM | POA: Diagnosis not present

## 2017-09-05 DIAGNOSIS — B1910 Unspecified viral hepatitis B without hepatic coma: Secondary | ICD-10-CM | POA: Diagnosis not present

## 2017-09-17 ENCOUNTER — Ambulatory Visit (INDEPENDENT_AMBULATORY_CARE_PROVIDER_SITE_OTHER): Payer: BLUE CROSS/BLUE SHIELD | Admitting: Family Medicine

## 2017-09-17 ENCOUNTER — Encounter: Payer: Self-pay | Admitting: Family Medicine

## 2017-09-17 VITALS — BP 122/76 | HR 84 | Temp 98.7°F | Resp 14 | Ht 61.06 in | Wt 177.1 lb

## 2017-09-17 DIAGNOSIS — Z23 Encounter for immunization: Secondary | ICD-10-CM | POA: Insufficient documentation

## 2017-09-17 DIAGNOSIS — B181 Chronic viral hepatitis B without delta-agent: Secondary | ICD-10-CM | POA: Diagnosis not present

## 2017-09-17 DIAGNOSIS — J4520 Mild intermittent asthma, uncomplicated: Secondary | ICD-10-CM

## 2017-09-17 DIAGNOSIS — Z1231 Encounter for screening mammogram for malignant neoplasm of breast: Secondary | ICD-10-CM | POA: Diagnosis not present

## 2017-09-17 DIAGNOSIS — Z0000 Encounter for general adult medical examination without abnormal findings: Secondary | ICD-10-CM | POA: Insufficient documentation

## 2017-09-17 DIAGNOSIS — F419 Anxiety disorder, unspecified: Secondary | ICD-10-CM

## 2017-09-17 DIAGNOSIS — Z01419 Encounter for gynecological examination (general) (routine) without abnormal findings: Secondary | ICD-10-CM | POA: Insufficient documentation

## 2017-09-17 MED ORDER — BECLOMETHASONE DIPROPIONATE 40 MCG/ACT IN AERS: 2.0000 | INHALATION_SPRAY | Freq: Two times a day (BID) | RESPIRATORY_TRACT | 5 refills | Status: DC

## 2017-09-17 NOTE — Patient Instructions (Addendum)
You received the flu shot today; it should protect you against the flu virus over the coming months; it will take about two weeks for antibodies to develop; do try to stay away from hospitals, nursing homes, and daycares during peak flu season; taking extra vitamin C daily during flu season may help you avoid getting sick You have received the Pneumovax vaccine (PPSV-23) and you will not need another booster of this for the rest of your life per current ACIP guidelines  12 Ways to Curb Anxiety  ?Anxiety is normal human sensation. It is what helped our ancestors survive the pitfalls of the wilderness. Anxiety is defined as experiencing worry or nervousness about an imminent event or something with an uncertain outcome. It is a feeling experienced by most people at some point in their lives. Anxiety can be triggered by a very personal issue, such as the illness of a loved one, or an event of global proportions, such as a refugee crisis. Some of the symptoms of anxiety are:  Feeling restless.  Having a feeling of impending danger.  Increased heart rate.  Rapid breathing. Sweating.  Shaking.  Weakness or feeling tired.  Difficulty concentrating on anything except the current worry.  Insomnia.  Stomach or bowel problems. What can we do about anxiety we may be feeling? There are many techniques to help manage stress and relax. Here are 12 ways you can reduce your anxiety almost immediately: 1. Turn off the constant feed of information. Take a social media sabbatical. Studies have shown that social media directly contributes to social anxiety.  2. Monitor your television viewing habits. Are you watching shows that are also contributing to your anxiety, such as 24-hour news stations? Try watching something else, or better yet, nothing at all. Instead, listen to music, read an inspirational book or practice a hobby. 3. Eat nutritious meals. Also, don't skip meals and keep healthful snacks on hand. Hunger  and poor diet contributes to feeling anxious. 4. Sleep. Sleeping on a regular schedule for at least seven to eight hours a night will do wonders for your outlook when you are awake. 5. Exercise. Regular exercise will help rid your body of that anxious energy and help you get more restful sleep. 6. Try deep (diaphragmatic) breathing. Inhale slowly through your nose for five seconds and exhale through your mouth. 7. Practice acceptance and gratitude. When anxiety hits, accept that there are things out of your control that shouldn't be of immediate concern.  8. Seek out humor. When anxiety strikes, watch a funny video, read jokes or call a friend who makes you laugh. Laughter is healing for our bodies and releases endorphins that are calming. 9. Stay positive. Take the effort to replace negative thoughts with positive ones. Try to see a stressful situation in a positive light. Try to come up with solutions rather than dwelling on the problem. 10. Figure out what triggers your anxiety. Keep a journal and make note of anxious moments and the events surrounding them. This will help you identify triggers you can avoid or even eliminate. 11. Talk to someone. Let a trusted friend, family member or even trained professional know that you are feeling overwhelmed and anxious. Verbalize what you are feeling and why.  12. Volunteer. If your anxiety is triggered by a crisis on a large scale, become an advocate and work to resolve the problem that is causing you unease. Anxiety is often unwelcome and can become overwhelming. If not kept in check, it can  become a disorder that could require medical treatment. However, if you take the time to care for yourself and avoid the triggers that make you anxious, you will be able to find moments of relaxation and clarity that make your life much more enjoyable.

## 2017-09-17 NOTE — Progress Notes (Signed)
BP 122/76 (BP Location: Left Arm, Patient Position: Sitting, Cuff Size: Normal)   Pulse 84   Temp 98.7 F (37.1 C) (Oral)   Resp 14   Ht 5' 1.06" (1.551 m)   Wt 177 lb 1.6 oz (80.3 kg)   LMP 09/14/2017   SpO2 96%   BMI 33.39 kg/m    Subjective:    Patient ID: Laura Espinoza, female    DOB: [birth year], 41 y.o.   MRN: [id number]  HPI: MADYLYN INSCO is a 41 y.o. female  Chief Complaint  Patient presents with  . Follow-up  . Medication Refill    HPI Here for follow-up She has some anxiety, her husband was diagnosed with acute hepatitis B; he was really sick, on the liver transplant list; she is caring for her 15 year old son, who has been sick with a cold over the weekend; she denies anyf ever or cough Herbal teas, chamomile, lavendar helps her too with sleep and anxiety Breathing is okay; she has asthma; she got sick in July and it triggered bad cough; using the ventolin a few times Using QVAR just for a week or two when having times of triggers No problems with thrush Breast lump went completely away; reviewed last mammo and [place] She has frozen embryos and is thinking about another baby Patient has hepatitis B, but her viral load is less than 200; followed by GI; going to have another test for fibrosis in early 2019  Depression screen [facility] 2/9 09/17/2017 09/15/2016 03/16/2016 11/17/2015  Decreased Interest 0 0 0 0  Down, Depressed, Hopeless 0 0 0 0  PHQ - 2 Score 0 0 0 0   Relevant past medical, surgical, family and social history reviewed Past Medical History:  Diagnosis Date  . Allergic rhinitis   . Anxiety   . Gastritis   . OM (otitis media), recurrent    as a child; ruptured TM  . Pneumonia 2007   hospitalized for possible pneumonia in [place] for 2 weeks  . Recurrent sinusitis    Past Surgical History:  Procedure Laterality Date  . fallopian tube removal Left March 2014  . LAPAROSCOPIC OVARIAN CYSTECTOMY  2008   in [place]  . REFRACTIVE SURGERY Bilateral 1998  .  TYMPANOPLASTY WITH GRAFT Left 2017   [place], Dr. Ned Grace   Family History  Problem Relation Age of Onset  . Hypertension Mother   . Heart disease Mother   . CAD Mother   . Heart attack Mother   . Arthritis Mother   . CAD Father   . Heart disease Father   . Glaucoma Father   . Cancer Father        kidney  . Hypertension Brother   . Breast cancer Paternal Aunt        late 13's- early 62's  . Cancer Maternal Grandfather        unknown  . Stroke Paternal Grandmother   . Cancer Paternal Grandfather        lung?  Marland Kitchen COPD Neg Hx   . Diabetes Neg Hx    Social History   Social History  . Marital status: Married    Spouse name: N/A  . Number of children: N/A  . Years of education: N/A   Occupational History  . Not on file.   Social History Main Topics  . Smoking status: Never Smoker  . Smokeless tobacco: Never Used  . Alcohol use Yes     Comment: occasional  .  Drug use: No  . Sexual activity: Yes   Other Topics Concern  . Not on file   Social History Narrative  . No narrative on file    Interim medical history since last visit reviewed. Allergies and medications reviewed  Review of Systems Per HPI unless specifically indicated above     Objective:    BP 122/76 (BP Location: Left Arm, Patient Position: Sitting, Cuff Size: Normal)   Pulse 84   Temp 98.7 F (37.1 C) (Oral)   Resp 14   Ht 5' 1.06" (1.551 m)   Wt 177 lb 1.6 oz (80.3 kg)   LMP 09/14/2017   SpO2 96%   BMI 33.39 kg/m   Wt Readings from Last 3 Encounters:  09/17/17 177 lb 1.6 oz (80.3 kg)  09/15/16 180 lb (81.6 kg)  03/16/16 176 lb (79.8 kg)    Physical Exam  Constitutional: She appears well-developed and well-nourished.  HENT:  Mouth/Throat: Mucous membranes are normal.  Eyes: EOM are normal. No scleral icterus.  Cardiovascular: Normal rate and regular rhythm.   Pulmonary/Chest: Effort normal and breath sounds normal.  Abdominal: She exhibits no distension.  Neurological: She is  alert.  Skin:  No jaundice  Psychiatric: She has a normal mood and affect. Her behavior is normal.    Results for orders placed or performed in visit on 03/16/16  Hepatic function panel  Result Value Ref Range   Total Protein 7.5 6.0 - 8.5 g/dL   Albumin 4.8 3.5 - 5.5 g/dL   Bilirubin Total 0.5 0.0 - 1.2 mg/dL   Bilirubin, Direct 0.14 0.00 - 0.40 mg/dL   Alkaline Phosphatase 71 39 - 117 IU/L   AST 20 0 - 40 IU/L   ALT 19 0 - 32 IU/L      Assessment & Plan:   Problem List Items Addressed This Visit      Respiratory   Asthma - Primary (Chronic)    Continue the course; avoid triggers      Relevant Medications   beclomethasone (QVAR) 40 MCG/ACT inhaler     Digestive   HBV (hepatitis B virus) infection (Chronic)    Stable, saw GI        Other   Visit for screening mammogram    Reviewed last mammo and [place]; yearly mammo at 56      Relevant Orders   MM DIGITAL SCREENING BILATERAL   Vaccine for streptococcus pneumoniae and influenza    Flu vaccine and PPSV-23 offered; next booster will be after age 89 one year after the PCV-13      Relevant Orders   Pneumococcal polysaccharide vaccine 23-valent greater than or equal to 2yo subcutaneous/IM (Completed)   Anxiety    Supportive listening; husband has been ill; offered counseling       Other Visit Diagnoses    Needs flu shot       Relevant Orders   Flu Vaccine QUAD 6+ mos PF IM (Fluarix Quad PF) (Completed)       Follow up plan: Return in about 1 year (around 09/17/2018) for twenty minute follow-up with fasting labs.  An after-visit summary was printed and given to the patient at [facility].  Please see the patient instructions which may contain other information and recommendations beyond what is mentioned above in the assessment and plan.  Meds ordered this encounter  Medications  . beclomethasone (QVAR) 40 MCG/ACT inhaler    Sig: Inhale 2 puffs into the lungs 2 (two) times daily. Reported on 12/09/2015  Dispense:  1 Inhaler    Refill:  5    Orders Placed This Encounter  Procedures  . MM DIGITAL SCREENING BILATERAL  . Flu Vaccine QUAD 6+ mos PF IM (Fluarix Quad PF)  . Pneumococcal polysaccharide vaccine 23-valent greater than or equal to 2yo subcutaneous/IM

## 2017-09-17 NOTE — Assessment & Plan Note (Signed)
Flu vaccine and PPSV-23 offered; next booster will be after age 41 one year after the PCV-13

## 2017-09-17 NOTE — Assessment & Plan Note (Signed)
Stable, saw GI

## 2017-09-17 NOTE — Assessment & Plan Note (Signed)
Supportive listening; husband has been ill; offered counseling

## 2017-09-17 NOTE — Assessment & Plan Note (Signed)
Continue the course; avoid triggers

## 2017-09-17 NOTE — Assessment & Plan Note (Signed)
Reviewed last mammo and [place]; yearly mammo at 55

## 2017-09-21 ENCOUNTER — Encounter: Payer: Self-pay | Admitting: Maternal Newborn

## 2017-09-21 ENCOUNTER — Ambulatory Visit (INDEPENDENT_AMBULATORY_CARE_PROVIDER_SITE_OTHER): Payer: BLUE CROSS/BLUE SHIELD | Admitting: Maternal Newborn

## 2017-09-21 VITALS — BP 130/90 | HR 74 | Ht 61.0 in | Wt 181.0 lb

## 2017-09-21 DIAGNOSIS — Z124 Encounter for screening for malignant neoplasm of cervix: Secondary | ICD-10-CM | POA: Diagnosis not present

## 2017-09-21 DIAGNOSIS — Z01419 Encounter for gynecological examination (general) (routine) without abnormal findings: Secondary | ICD-10-CM

## 2017-09-21 NOTE — Progress Notes (Signed)
Gynecology Annual Exam  PCP: Arnetha Courser, MD  Chief Complaint:  Chief Complaint  Patient presents with  . Gynecologic Exam    07/28/2014 last pap    History of Present Illness: Patient is a 41 y.o. G1P0010 presents for annual exam. The patient has no complaints today.   LMP: Patient's last menstrual period was 09/14/2017. Average Interval: regular, 28 days Duration of flow: 3 days Heavy Menses: no Clots: no Intermenstrual Bleeding: no Postcoital Bleeding: no Dysmenorrhea: no  The patient is sexually active. She currently uses none for contraception. She denies dyspareunia.  The patient does perform self breast exams.  There is no notable family history of breast or ovarian cancer in her family.  The patient wears seatbelts: yes.   The patient has regular exercise: no.    The patient denies current symptoms of depression.    Review of Systems  Constitutional: Negative for fever, malaise/fatigue and weight loss.  HENT: Negative for congestion, sinus pain and sore throat.   Eyes: Negative.   Respiratory: Negative for cough, shortness of breath and wheezing.        Occasional asthma symptoms managed with inhaler use  Cardiovascular: Negative for chest pain and palpitations.  Gastrointestinal: Negative for abdominal pain, heartburn and nausea.  Genitourinary: Negative for dysuria, frequency and urgency.  Musculoskeletal: Negative.   Skin: Negative.   Neurological: Negative.   Endo/Heme/Allergies: Negative.   Psychiatric/Behavioral: Negative for depression. The patient is not nervous/anxious and does not have insomnia.   Breast ROS: negative for - galactorrhea, new or changing breast lumps, nipple changes or nipple discharge. All other systems negative.  Past Medical History:  Past Medical History:  Diagnosis Date  . Allergic rhinitis   . Anxiety   . Gastritis   . OM (otitis media), recurrent    as a child; ruptured TM  . Pneumonia 2007   hospitalized for  possible pneumonia in [place] for 2 weeks  . Recurrent sinusitis     Past Surgical History:  Past Surgical History:  Procedure Laterality Date  . fallopian tube removal Left March 2014  . INNER EAR SURGERY    . LAPAROSCOPIC OVARIAN CYSTECTOMY  2008   in [place]  . REFRACTIVE SURGERY Bilateral 1998  . TYMPANOPLASTY WITH GRAFT Left 2017   [place], Dr. Ned Grace    Gynecologic History:  Patient's last menstrual period was 09/14/2017. Contraception: none Last Pap: 07/28/2014. Results were: NIL and HR HPV negative.   Obstetric History: G1P0010  Family History:  Family History  Problem Relation Age of Onset  . Hypertension Mother   . Heart disease Mother   . CAD Mother   . Heart attack Mother   . Arthritis Mother   . CAD Father   . Heart disease Father   . Glaucoma Father   . Cancer Father        kidney  . Hypertension Brother   . Breast cancer Paternal Aunt        late 34's- early 67's  . Cancer Maternal Grandfather        unknown  . Stroke Paternal Grandmother   . Cancer Paternal Grandfather        lung?  Marland Kitchen COPD Neg Hx   . Diabetes Neg Hx     Social History:  Social History   Social History  . Marital status: Married    Spouse name: N/A  . Number of children: N/A  . Years of education: N/A   Occupational History  .  Not on file.   Social History Main Topics  . Smoking status: Never Smoker  . Smokeless tobacco: Never Used  . Alcohol use Yes     Comment: occasional  . Drug use: No  . Sexual activity: Yes   Other Topics Concern  . Not on file   Social History Narrative  . No narrative on file    Allergies:  Allergies  Allergen Reactions  . Novocain [Procaine] Anaphylaxis    Medications: Prior to Admission medications   Medication Sig Start Date End Date Taking? Authorizing Provider  albuterol (PROVENTIL HFA;VENTOLIN HFA) 108 (90 Base) MCG/ACT inhaler Inhale 2 puffs into the lungs every 4 (four) hours as needed for wheezing or shortness of breath.  Reported on 12/09/2015 03/16/16   Arnetha Courser, MD  beclomethasone (QVAR) 40 MCG/ACT inhaler Inhale 2 puffs into the lungs 2 (two) times daily. Reported on 12/09/2015 09/17/17   Arnetha Courser, MD  Multiple Vitamin (MULTIVITAMIN) tablet Take 1 tablet by mouth daily. Reported on 12/09/2015    [provider]    Physical Exam Vitals: Blood pressure 130/90, pulse 74, height 5\' 1"  (1.549 m), weight 181 lb (82.1 kg), last menstrual period 09/14/2017.  General: NAD HEENT: normocephalic, anicteric Thyroid: no enlargement, no palpable nodules Pulmonary: No increased work of breathing, CTAB Cardiovascular: RRR, distal pulses 2+ Breasts: Breast symmetrical, no tenderness, no palpable nodules or masses, no skin or nipple retraction present, no nipple discharge.  No axillary or supraclavicular lymphadenopathy. Abdomen: NABS, soft, non-tender, non-distended.  Umbilicus without lesions.  No hepatomegaly, splenomegaly or masses palpable. No evidence of hernia  Genitourinary:  External: Normal external female genitalia.  Normal urethral meatus, normal  Bartholin's and Skene's glands.    Vagina: Normal vaginal mucosa, no evidence of prolapse.    Cervix: Grossly normal in appearance, no bleeding  Uterus: Non-enlarged, mobile, normal contour.  No CMT  Adnexa: ovaries non-enlarged, no adnexal masses  Rectal: deferred  Lymphatic: no evidence of inguinal lymphadenopathy Extremities: no edema, erythema, or tenderness Neurologic: Grossly intact Psychiatric: mood appropriate, affect full  Assessment: 41 y.o. G1P0010 routine annual exam  Plan: Problem List Items Addressed This Visit   Visit Diagnoses    Encounter for annual routine gynecological examination    -  Primary   Pap smear for cervical cancer screening       Relevant Orders   Pap IG and HPV (high risk) DNA detection     1) STI screening was offered and declined.  2) ASCCP guidelines and rationale discussed.  Patient opts for every 3  year screening interval.  3) Contraception - Patient is trying to conceive. Will return to infertility specialists soon for IVF cycle.  4) Routine healthcare maintenance including cholesterol, diabetes screening discussed managed by PCP  5) Patient had diagnostic mammogram last year (10/17) after breast pain that resulted BI-RADS I; symptoms have resolved and her PCP has ordered a screening mammogram for this year.  6) Follow up 1 year for routine annual exam.  Avel Sensor, CNM 09/21/2017  10:04 AM

## 2017-09-25 LAB — PAP IG AND HPV HIGH-RISK
HPV, high-risk: NEGATIVE
PAP Smear Comment: 0

## 2017-11-09 ENCOUNTER — Ambulatory Visit
Admission: RE | Admit: 2017-11-09 | Discharge: 2017-11-09 | Disposition: A | Discharge status: Home / Self Care | Payer: BLUE CROSS/BLUE SHIELD | Source: Ambulatory Visit | Attending: Family Medicine | Admitting: Family Medicine

## 2017-11-09 DIAGNOSIS — Z1231 Encounter for screening mammogram for malignant neoplasm of breast: Secondary | ICD-10-CM | POA: Insufficient documentation

## 2017-11-11 ENCOUNTER — Other Ambulatory Visit: Payer: Self-pay | Admitting: Family Medicine

## 2017-11-11 ENCOUNTER — Encounter: Payer: Self-pay | Admitting: Family Medicine

## 2017-11-11 DIAGNOSIS — N6310 Unspecified lump in the right breast, unspecified quadrant: Secondary | ICD-10-CM

## 2017-11-11 HISTORY — DX: Unspecified lump in the right breast, unspecified quadrant: N63.10

## 2017-11-11 NOTE — Assessment & Plan Note (Signed)
RIGHT breast, add'l views ordered

## 2017-11-11 NOTE — Progress Notes (Signed)
IMPRESSION: Further evaluation is suggested for possible mass in the right breast.  RECOMMENDATION: Diagnostic mammogram and possibly ultrasound of the right breast. (Code:FI-R-56M)  ---------------------------------------  Orders placed Orders Placed This Encounter  Procedures  . MM DIAG BREAST TOMO UNI RIGHT    Standing Status:   Future    Standing Expiration Date:   02/09/2018    Order Specific Question:   Reason for Exam (SYMPTOM  OR DIAGNOSIS REQUIRED)    Answer:   right breast mass    Order Specific Question:   Is the patient pregnant?    Answer:   No    Comments:   but ask her to confirm    Order Specific Question:   Preferred imaging location?    Answer:   [facility]  . US BREAST LTD UNI RIGHT INC AXILLA    Standing Status:   Future    Standing Expiration Date:   02/09/2018    Order Specific Question:   Reason for Exam (SYMPTOM  OR DIAGNOSIS REQUIRED)    Answer:   right breast mass    Order Specific Question:   Preferred imaging location?    Answer:   [facility]   ---------------------------------------------

## 2017-11-12 DIAGNOSIS — Z1231 Encounter for screening mammogram for malignant neoplasm of breast: Secondary | ICD-10-CM | POA: Diagnosis not present

## 2017-11-19 ENCOUNTER — Ambulatory Visit: Payer: BLUE CROSS/BLUE SHIELD

## 2017-11-19 ENCOUNTER — Other Ambulatory Visit: Payer: BLUE CROSS/BLUE SHIELD

## 2017-11-26 ENCOUNTER — Ambulatory Visit
Admission: RE | Admit: 2017-11-26 | Discharge: 2017-11-26 | Disposition: A | Discharge status: Home / Self Care | Payer: BLUE CROSS/BLUE SHIELD | Source: Ambulatory Visit | Attending: Family Medicine | Admitting: Family Medicine

## 2017-11-26 DIAGNOSIS — N6313 Unspecified lump in the right breast, lower outer quadrant: Secondary | ICD-10-CM | POA: Insufficient documentation

## 2017-11-26 DIAGNOSIS — N6310 Unspecified lump in the right breast, unspecified quadrant: Secondary | ICD-10-CM

## 2017-11-26 DIAGNOSIS — N6489 Other specified disorders of breast: Secondary | ICD-10-CM | POA: Diagnosis not present

## 2017-12-11 NOTE — L&D Delivery Note (Signed)
Operative Vaginal Delivery Note At 1:25 AM a viable female was delivered via Vaginal, Vacuum Neurosurgeon).  Presentation: vertex; Position: Occiput Anterior,; Station: +2.  Verbal consent: obtained from patient.  Risks and benefits discussed in detail.  Risks include, but are not limited to the risks of anesthesia, bleeding, infection, damage to maternal tissues, fetal cephalhematoma.  There is also the risk of inability to effect vaginal delivery of the head, or shoulder dystocia that cannot be resolved by established maneuvers, leading to the need for emergency cesarean section.  APGAR: 7, 9; weight  pending.   Placenta status: manual extraction after cord avulsion, .   Cord: 3 vessel with the following complications: none .   Anesthesia:  Epidural Instruments: Flat Kiwi - 2 applications, pop of with first use, delivery w second use (ctx) Episiotomy: None Lacerations:  2nd degree Suture Repair: 2.0 vicryl Est. Blood Loss (mL):  700 mL  Mom to postpartum.  Baby to Couplet care / Skin to Skin.  Hoyt Koch 10/18/2018, 1:51 AM

## 2017-12-21 DIAGNOSIS — Z3141 Encounter for fertility testing: Secondary | ICD-10-CM | POA: Diagnosis not present

## 2017-12-21 DIAGNOSIS — Z3169 Encounter for other general counseling and advice on procreation: Secondary | ICD-10-CM | POA: Diagnosis not present

## 2017-12-26 DIAGNOSIS — Z3141 Encounter for fertility testing: Secondary | ICD-10-CM | POA: Diagnosis not present

## 2018-01-28 DIAGNOSIS — H9012 Conductive hearing loss, unilateral, left ear, with unrestricted hearing on the contralateral side: Secondary | ICD-10-CM | POA: Diagnosis not present

## 2018-01-31 DIAGNOSIS — Z3141 Encounter for fertility testing: Secondary | ICD-10-CM | POA: Diagnosis not present

## 2018-02-11 IMAGING — US US ABDOMEN COMPLETE
1 series · 14 of 25 positions shown · non-contrast
Comparison: 06/15/2016.

CLINICAL DATA: Hepatitis.

EXAM:
ABDOMEN ULTRASOUND COMPLETE

[Series 1: us abdomen complete · 0.23mm/px · 14 of 107 slices shown]
[im 1/107]
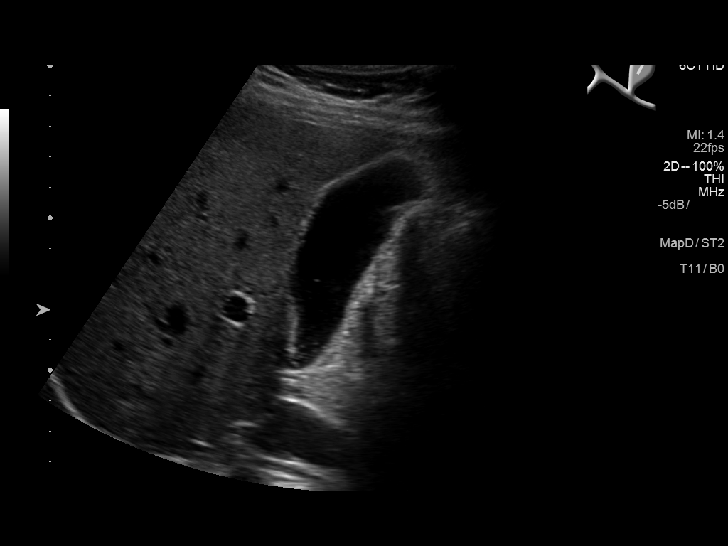
[im 9/107]
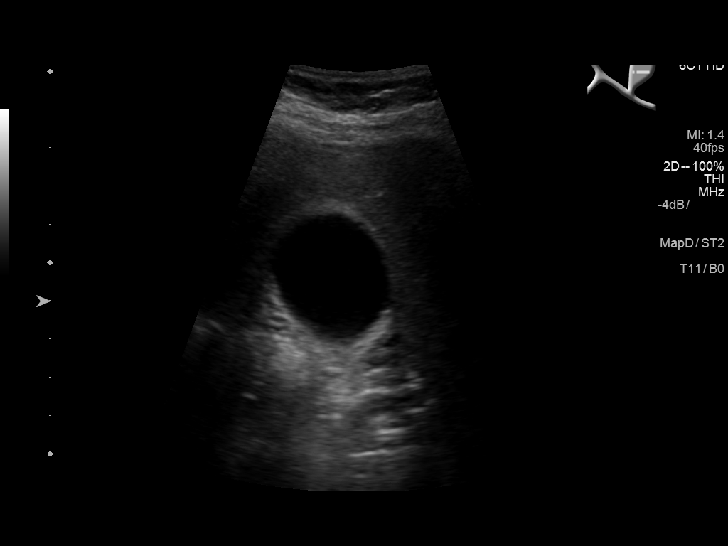
[im 18/107]
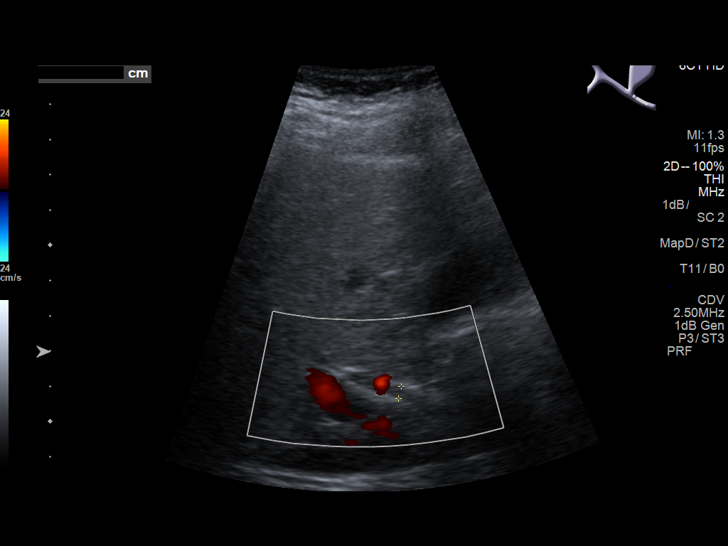
[im 27/107]
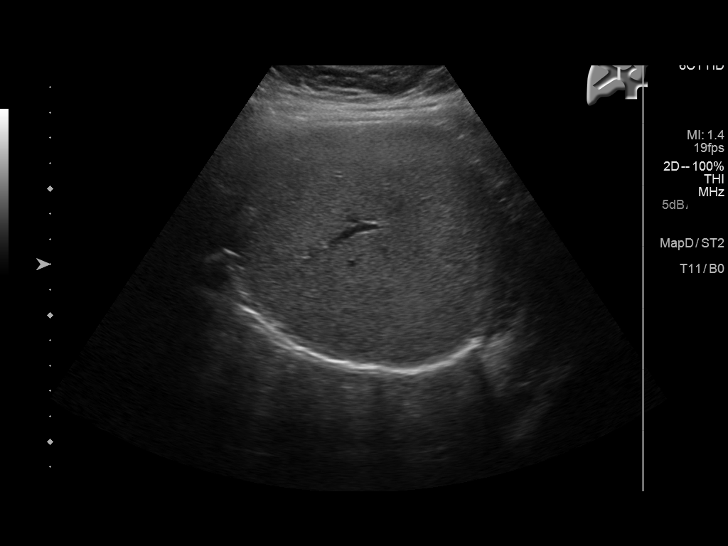
[im 36/107]
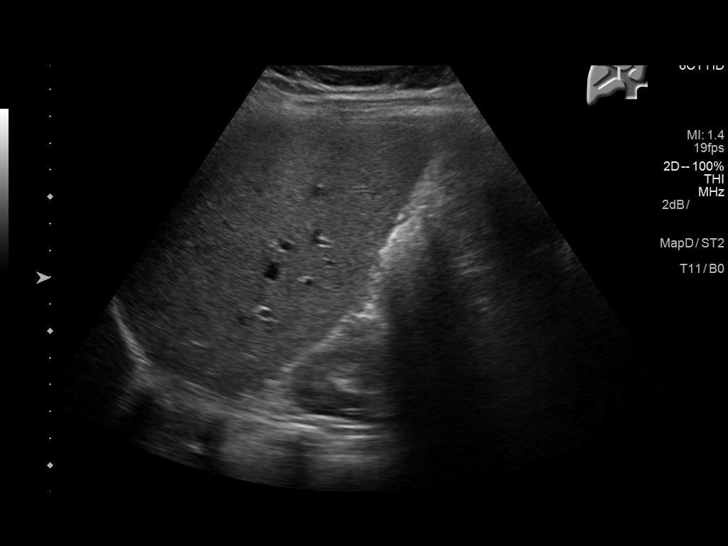
[im 40/107]
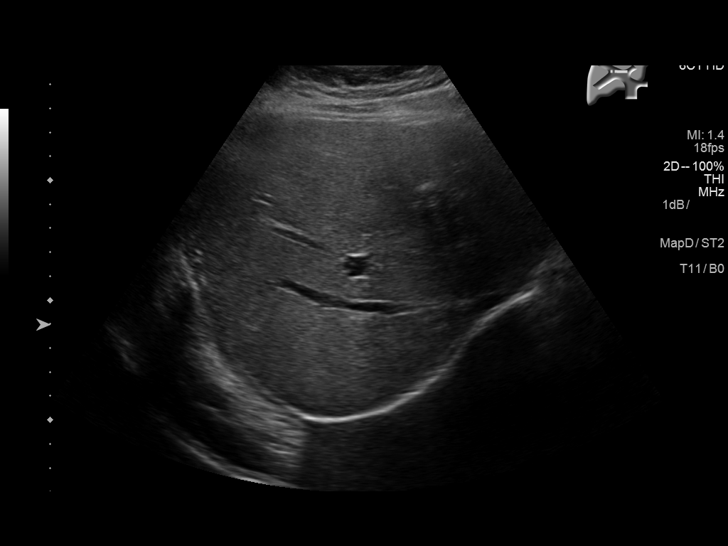
[im 49/107]
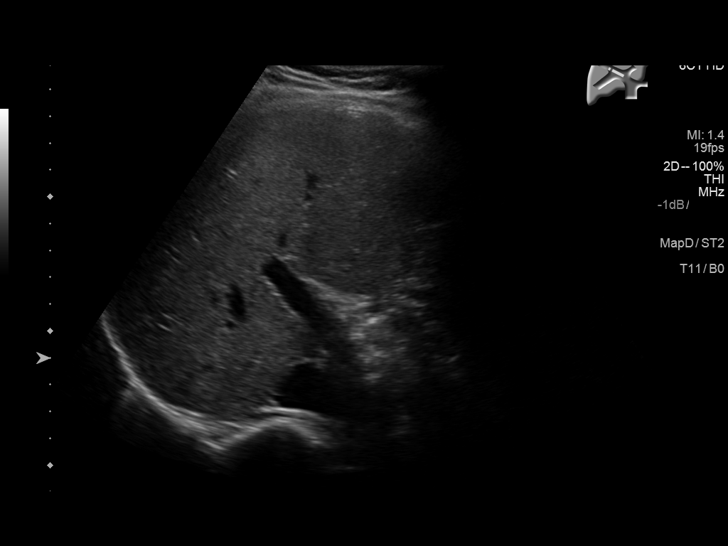
[im 58/107]
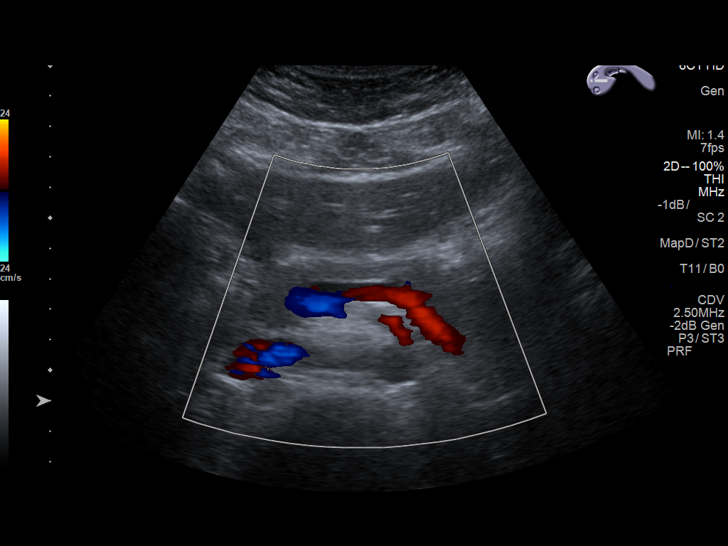
[im 67/107]
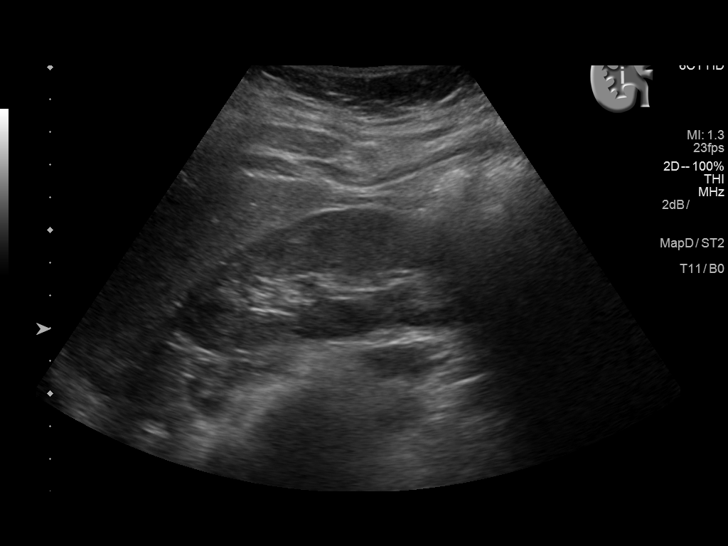
[im 71/107]
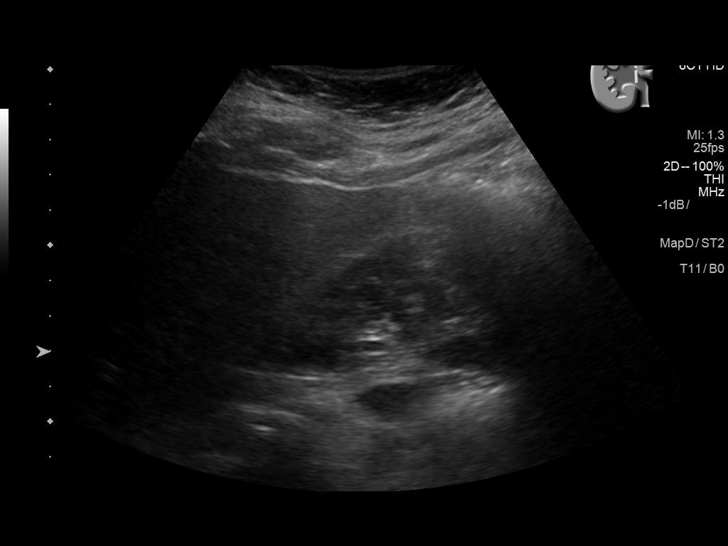
[im 80/107]
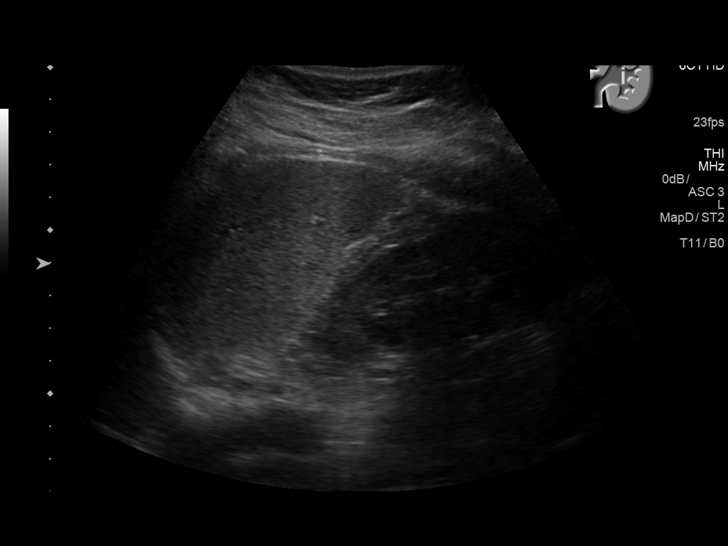
[im 89/107]
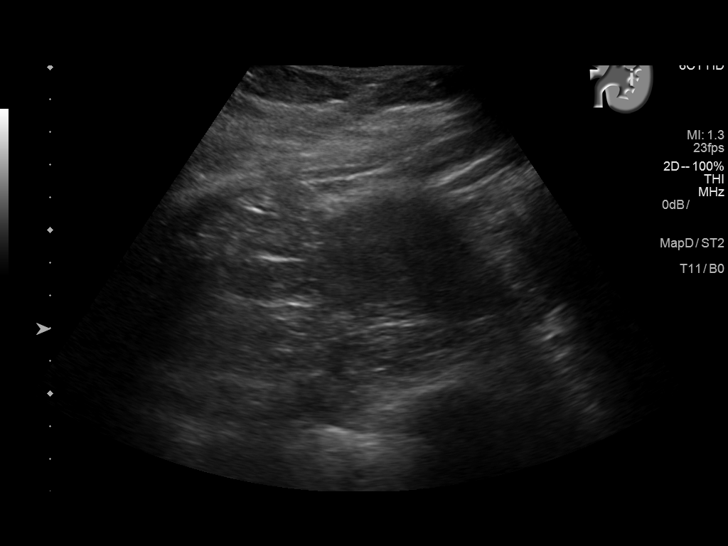
[im 98/107]
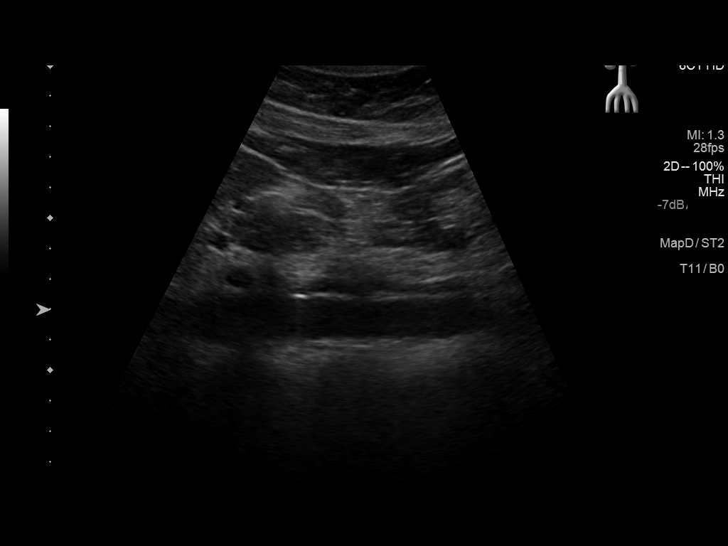
[im 107/107]
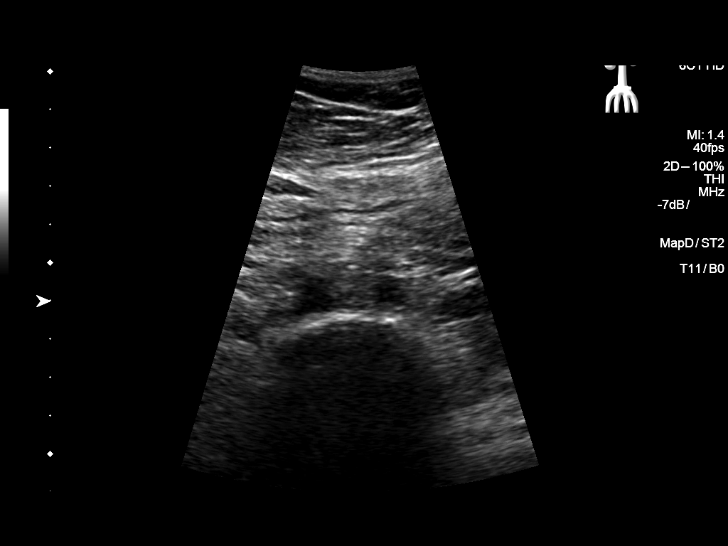

[14 of 25 positions shown; findings below may reference images not displayed]

FINDINGS: Gallbladder: Sludge is noted in the gallbladder. No gallstones.
mm

Common bile duct: Diameter: 3.5 mm

Liver: There is slightly echogenic suggesting fatty infiltration
and/or hepatocellular disease. No focal hepatic abnormality
identified.

IVC: No abnormality visualized.

Pancreas: Visualized portion unremarkable.

Spleen: Size and appearance within normal limits.

Right Kidney: Length: 10.3 cm. Echogenicity within normal limits. No
mass or hydronephrosis visualized.

Left Kidney: Length: 9.7 cm. Echogenicity within normal limits. No
mass or hydronephrosis visualized.

Abdominal aorta: No aneurysm visualized.

Other findings: None.
IMPRESSION: No acute or focal abnormality.

## 2018-02-15 DIAGNOSIS — Z3200 Encounter for pregnancy test, result unknown: Secondary | ICD-10-CM | POA: Diagnosis not present

## 2018-02-19 DIAGNOSIS — Z3200 Encounter for pregnancy test, result unknown: Secondary | ICD-10-CM | POA: Diagnosis not present

## 2018-03-08 DIAGNOSIS — O09811 Supervision of pregnancy resulting from assisted reproductive technology, first trimester: Secondary | ICD-10-CM | POA: Diagnosis not present

## 2018-03-08 DIAGNOSIS — Z3A00 Weeks of gestation of pregnancy not specified: Secondary | ICD-10-CM | POA: Diagnosis not present

## 2018-03-19 ENCOUNTER — Ambulatory Visit (INDEPENDENT_AMBULATORY_CARE_PROVIDER_SITE_OTHER): Payer: BLUE CROSS/BLUE SHIELD | Admitting: Obstetrics and Gynecology

## 2018-03-19 ENCOUNTER — Encounter: Payer: Self-pay | Admitting: Obstetrics and Gynecology

## 2018-03-19 VITALS — BP 130/70 | Wt 181.0 lb

## 2018-03-19 DIAGNOSIS — O0990 Supervision of high risk pregnancy, unspecified, unspecified trimester: Secondary | ICD-10-CM | POA: Diagnosis not present

## 2018-03-19 DIAGNOSIS — O99211 Obesity complicating pregnancy, first trimester: Secondary | ICD-10-CM

## 2018-03-19 DIAGNOSIS — O09511 Supervision of elderly primigravida, first trimester: Secondary | ICD-10-CM

## 2018-03-19 DIAGNOSIS — O30041 Twin pregnancy, dichorionic/diamniotic, first trimester: Secondary | ICD-10-CM

## 2018-03-19 DIAGNOSIS — O09819 Supervision of pregnancy resulting from assisted reproductive technology, unspecified trimester: Secondary | ICD-10-CM

## 2018-03-19 DIAGNOSIS — O219 Vomiting of pregnancy, unspecified: Secondary | ICD-10-CM

## 2018-03-19 DIAGNOSIS — B181 Chronic viral hepatitis B without delta-agent: Secondary | ICD-10-CM

## 2018-03-19 DIAGNOSIS — O09899 Supervision of other high risk pregnancies, unspecified trimester: Secondary | ICD-10-CM | POA: Insufficient documentation

## 2018-03-19 DIAGNOSIS — Z6835 Body mass index (BMI) 35.0-35.9, adult: Secondary | ICD-10-CM | POA: Insufficient documentation

## 2018-03-19 DIAGNOSIS — Z3A08 8 weeks gestation of pregnancy: Secondary | ICD-10-CM

## 2018-03-19 DIAGNOSIS — O09219 Supervision of pregnancy with history of pre-term labor, unspecified trimester: Secondary | ICD-10-CM

## 2018-03-19 DIAGNOSIS — Z6834 Body mass index (BMI) 34.0-34.9, adult: Secondary | ICD-10-CM

## 2018-03-19 DIAGNOSIS — O30001 Twin pregnancy, unspecified number of placenta and unspecified number of amniotic sacs, first trimester: Secondary | ICD-10-CM | POA: Insufficient documentation

## 2018-03-19 DIAGNOSIS — F419 Anxiety disorder, unspecified: Secondary | ICD-10-CM

## 2018-03-19 DIAGNOSIS — O99210 Obesity complicating pregnancy, unspecified trimester: Secondary | ICD-10-CM | POA: Insufficient documentation

## 2018-03-19 MED ORDER — DOXYLAMINE-PYRIDOXINE 10-10 MG PO TBEC: DELAYED_RELEASE_TABLET | ORAL | 3 refills | Status: DC

## 2018-03-19 NOTE — Progress Notes (Signed)
New Obstetric Patient H&P   Chief Complaint: "Desires prenatal care"  History of Present Illness: Patient is a 42 y.o. G2E3662 Not Hispanic or Latino female, pregnancy is a result of IVF and FET (Frozen Embryo Transfer) on 02/07/18, giving a conception date of 02/02/18.  Her last pap smear was 09/2017 and was no abnormalities.   She has had a pregnancy ultrasound showing a twin gestation on 03/08/18.  Chorionicity/amnionicity not specified. May be able to assume di-di given transfer of two separate embryos.     Since onset of pregnancy she claims she has experienced nausea (Diclegis), heartburn (pepcid OTC), and seasonal allergies (Claritin). She denies vaginal bleeding. Her past medical history is notable for hepatitis B. Her prior pregnancies are notable for preterm delivery at 72 weeks, gestational diabetes (GDMA1). She states she had PPROM at 34 weeks and she delivered vaginally.   Since her LMP, she admits to the use of tobacco products  no She claims she has lost a couple pounds since the start of her pregnancy.  There are cats in the home in the home  no  She admits close contact with children on a regular basis  yes  She has had chicken pox in the past yes She has had Tuberculosis exposures, symptoms, or previously tested positive for TB   no Current or past history of domestic violence. no  Genetic Screening/Teratology Counseling: (Includes patient, baby's father, or anyone in either family with:)   49. Patient's age >/= 51 at [facility]  yes 2. Thalassemia ([place], [place], [place], or Asian background): MCV<80  no 3. Neural tube defect (meningomyelocele, spina bifida, anencephaly)  no 4. Congenital heart defect  no  5. Down syndrome  no 6. Tay-Sachs (Jewish, [place])  no 7. Canavan's Disease  no 8. Sickle cell disease or trait (African)  no  9. Hemophilia or other blood disorders  no  10. Muscular dystrophy  no  11. Cystic fibrosis  no  12. Huntington's Chorea  no  13.  Mental retardation/autism  no 14. Other inherited genetic or chromosomal disorder  no 15. Maternal metabolic disorder (DM, PKU, etc)  no 16. Patient or FOB with a child with a birth defect not listed above no  16a. Patient or FOB with a birth defect themselves no 17. Recurrent pregnancy loss, or stillbirth  no  18. Any medications since LMP other than prenatal vitamins (include vitamins, supplements, OTC meds, drugs, alcohol)  Still taking progesterone injections and estrogen pills and patches. Plan, per REI, is to take up until 10 weeks.  19. Any other genetic/environmental exposure to discuss  no  Infection History:   1. Lives with someone with TB or TB exposed  no  2. Patient or partner has history of genital herpes  no 3. Rash or viral illness since LMP  no 4. History of STI (GC, CT, HPV, syphilis, HIV)  Mother with hepatitis B 5. History of recent travel :  no  Other pertinent information:  no   Review of Systems:10 point review of systems negative unless otherwise noted in HPI  Past Medical History:  Diagnosis Date  . Allergic rhinitis   . Anxiety   . Breast mass, right 11/11/2017   Nov 09, 2017, add'l views ordered  . Gastritis   . Hepatitis B   . OM (otitis media), recurrent    as a child; ruptured TM  . Pneumonia 2007   hospitalized for possible pneumonia in [place] for 2 weeks  . Recurrent sinusitis  Past Surgical History:  Procedure Laterality Date  . fallopian tube removal Left March 2014  . INNER EAR SURGERY    . LAPAROSCOPIC OVARIAN CYSTECTOMY  2008   in [place]  . REFRACTIVE SURGERY Bilateral 1998  . TYMPANOPLASTY WITH GRAFT Left 2017   [place], Dr. Ned Grace    Gynecologic History: Patient's last menstrual period was 01/12/2018.  Obstetric History: Espinoza  Family History  Problem Relation Age of Onset  . Hypertension Mother   . Heart disease Mother   . CAD Mother   . Heart attack Mother   . Arthritis Mother   . CAD Father   . Heart disease  Father   . Glaucoma Father   . Cancer Father        kidney  . Hypertension Brother   . Breast cancer Paternal Aunt        late 89's- early 12's  . Cancer Maternal Grandfather        unknown  . Stroke Paternal Grandmother   . Cancer Paternal Grandfather        lung?  Marland Kitchen COPD Neg Hx   . Diabetes Neg Hx     Social History   Socioeconomic History  . Marital status: Married    Spouse name: Not on file  . Number of children: Not on file  . Years of education: Not on file  . Highest education level: Not on file  Occupational History  . Not on file  Social Needs  . Financial resource strain: Not on file  . Food insecurity:    Worry: Not on file    Inability: Not on file  . Transportation needs:    Medical: Not on file    Non-medical: Not on file  Tobacco Use  . Smoking status: Never Smoker  . Smokeless tobacco: Never Used  Substance and Sexual Activity  . Alcohol use: Yes    Comment: occasional  . Drug use: No  . Sexual activity: Yes  Lifestyle  . Physical activity:    Days per week: Not on file    Minutes per session: Not on file  . Stress: Not on file  Relationships  . Social connections:    Talks on phone: Not on file    Gets together: Not on file    Attends religious service: Not on file    Active member of club or organization: Not on file    Attends meetings of clubs or organizations: Not on file    Relationship status: Not on file  . Intimate partner violence:    Fear of current or ex partner: Not on file    Emotionally abused: Not on file    Physically abused: Not on file    Forced sexual activity: Not on file  Other Topics Concern  . Not on file  Social History Narrative  . Not on file    Allergies  Allergen Reactions  . Novocain [Procaine] Anaphylaxis    Prior to Admission medications   Medication Sig Start Date End Date Taking? Authorizing Provider  Prenatal Vit-Fe Fumarate-FA (PRENATAL VITAMIN PO) Take by mouth.   Yes [provider]  Multiple Vitamin (MULTIVITAMIN) tablet Take 1 tablet by mouth daily. Reported on 12/09/2015    [provider]    Physical Exam BP 130/70   Wt 181 lb (82.1 kg)   LMP 01/12/2018   BMI 34.20 kg/m   Physical Exam  Constitutional: She is oriented to person, place, and time. She appears well-developed and  well-nourished. No distress.  HENT:  Head: Normocephalic and atraumatic.  Eyes: Conjunctivae are normal.  Neck: Normal range of motion. Neck supple. No thyromegaly present.  Cardiovascular: Normal rate, regular rhythm and normal heart sounds. Exam reveals no gallop and no friction rub.  No murmur heard. Pulmonary/Chest: Effort normal and breath sounds normal. She has no wheezes.  Abdominal: Soft. She exhibits no distension and no mass. There is no tenderness. There is no rebound and no guarding. No hernia. Hernia confirmed negative in the right inguinal area and confirmed negative in the left inguinal area.  Genitourinary: Vagina normal. Pelvic exam was performed with patient supine. There is no rash, tenderness or lesion on the right labia. There is no rash, tenderness or lesion on the left labia. Uterus is enlarged. Uterus is not deviated and not tender. Cervix exhibits no motion tenderness, no discharge and no friability. Right adnexum displays no mass, no tenderness and no fullness. Left adnexum displays no mass, no tenderness and no fullness.  Musculoskeletal: Normal range of motion.  Lymphadenopathy:       Right: No inguinal adenopathy present.       Left: No inguinal adenopathy present.  Neurological: She is alert and oriented to person, place, and time.  Skin: Skin is warm and dry. No rash noted.  Psychiatric: She has a normal mood and affect. Her behavior is normal. Judgment normal.    Female Chaperone present during breast and/or pelvic exam.   Assessment: Laura Espinoza at [redacted]w[redacted]d presenting to initiate prenatal care, di-di twins, chronic hepatitis B  Plan: 1)  Avoid alcoholic beverages. 2) Patient encouraged not to smoke.  3) Discontinue the use of all non-medicinal drugs and chemicals.  4) Take prenatal vitamins daily.  5) Nutrition, food safety (fish, cheese advisories, and high nitrite foods) and exercise discussed. 6) Hospital and practice style discussed with cross coverage system.  7) Genetic Screening, such as with 1st Trimester Screening, cell free fetal DNA, AFP testing, and Ultrasound, as well as with amniocentesis and CVS as appropriate, is discussed with patient. At the conclusion of today's visit patient requested genetic testing 8) Patient is asked about travel to areas at risk for the Zika virus, and counseled to avoid travel and exposure to mosquitoes or sexual partners who may have themselves been exposed to the virus. Testing is discussed, and will be ordered as appropriate.  9) Multifetal gestation: start bASA after 12 weeks, 10) ART pregnancy: fetal echo at 22 weeks 11) Obesity, BMI >30: early 1h gtt, bASA after [redacted] weeks gestation 87) history of preterm delivery: no intervention indicated given multifetal gestation.  Prentice Docker, MD 03/19/2018 10:53 AM

## 2018-03-21 LAB — GC/CHLAMYDIA PROBE AMP
Chlamydia trachomatis, NAA: NEGATIVE
Neisseria gonorrhoeae by PCR: NEGATIVE

## 2018-03-21 LAB — URINE CULTURE

## 2018-04-01 ENCOUNTER — Encounter: Payer: Self-pay | Admitting: Obstetrics and Gynecology

## 2018-04-01 ENCOUNTER — Ambulatory Visit (INDEPENDENT_AMBULATORY_CARE_PROVIDER_SITE_OTHER): Payer: BLUE CROSS/BLUE SHIELD | Admitting: Obstetrics and Gynecology

## 2018-04-01 ENCOUNTER — Other Ambulatory Visit: Payer: BLUE CROSS/BLUE SHIELD

## 2018-04-01 ENCOUNTER — Ambulatory Visit (INDEPENDENT_AMBULATORY_CARE_PROVIDER_SITE_OTHER): Payer: BLUE CROSS/BLUE SHIELD

## 2018-04-01 VITALS — BP 132/78 | Wt 181.0 lb

## 2018-04-01 DIAGNOSIS — O99211 Obesity complicating pregnancy, first trimester: Secondary | ICD-10-CM | POA: Diagnosis not present

## 2018-04-01 DIAGNOSIS — O3121X0 Continuing pregnancy after intrauterine death of one fetus or more, first trimester, not applicable or unspecified: Secondary | ICD-10-CM

## 2018-04-01 DIAGNOSIS — Z6834 Body mass index (BMI) 34.0-34.9, adult: Secondary | ICD-10-CM

## 2018-04-01 DIAGNOSIS — O3110X0 Continuing pregnancy after spontaneous abortion of one fetus or more, unspecified trimester, not applicable or unspecified: Secondary | ICD-10-CM

## 2018-04-01 DIAGNOSIS — O0990 Supervision of high risk pregnancy, unspecified, unspecified trimester: Secondary | ICD-10-CM

## 2018-04-01 DIAGNOSIS — O09219 Supervision of pregnancy with history of pre-term labor, unspecified trimester: Secondary | ICD-10-CM

## 2018-04-01 DIAGNOSIS — O09899 Supervision of other high risk pregnancies, unspecified trimester: Secondary | ICD-10-CM

## 2018-04-01 DIAGNOSIS — O09511 Supervision of elderly primigravida, first trimester: Secondary | ICD-10-CM

## 2018-04-01 DIAGNOSIS — O30041 Twin pregnancy, dichorionic/diamniotic, first trimester: Secondary | ICD-10-CM

## 2018-04-01 DIAGNOSIS — O09819 Supervision of pregnancy resulting from assisted reproductive technology, unspecified trimester: Secondary | ICD-10-CM

## 2018-04-01 DIAGNOSIS — M546 Pain in thoracic spine: Secondary | ICD-10-CM

## 2018-04-01 MED ORDER — ASPIRIN EC 81 MG PO TBEC: 81.0000 mg | DELAYED_RELEASE_TABLET | Freq: Every day | ORAL | 2 refills | Status: DC

## 2018-04-01 NOTE — Progress Notes (Addendum)
Routine Prenatal Care Visit  Subjective  Laura Espinoza is a 42 y.o. G3P1011 at [redacted]w[redacted]d being seen today for ongoing prenatal care.  She is currently monitored for the following issues for this high-risk pregnancy and has Allergic rhinitis; Anxiety; Gastritis; Right knee pain; Thoracic back pain; Preventative health care; HBV (hepatitis B virus) infection; Perforated tympanic membrane; Need for hepatitis A vaccination; Asthma; Vaccine for streptococcus pneumoniae and influenza; Visit for screening mammogram; Breast mass, right; Supervision of high risk pregnancy, antepartum; Supervision of pregnancy resulting from assisted reproductive technology; Advanced maternal age, primigravida; Twin gestation in first trimester; Obesity affecting pregnancy; BMI 34.0-34.9,adult; History of preterm delivery, currently pregnant; and Nausea and vomiting during pregnancy on their problem list.  ----------------------------------------------------------------------------------- Patient reports backache.  Left sided rib and throacic pain after reaching for an object on a high shelf. Contractions: Not present. Vag. Bleeding: Scant.   . Denies leaking of fluid.  ----------------------------------------------------------------------------------- The following portions of the patient's history were reviewed and updated as appropriate: allergies, current medications, past family history, past medical history, past social history, past surgical history and problem list. Problem list updated.   Objective  Blood pressure 132/78, weight 181 lb (82.1 kg), last menstrual period 01/12/2018. Pregravid weight 181 lb (82.1 kg) Total Weight Gain 0 lb (0 kg) Urinalysis: Urine Protein: Negative Urine Glucose: Negative  Fetal Status: Fetal Heart Rate (bpm): 165         General:  Alert, oriented and cooperative. Patient is in no acute distress.  Skin: Skin is warm and dry. No rash noted.   Cardiovascular: Normal heart rate noted    Respiratory: Normal respiratory effort, no problems with respiration noted  Abdomen: Soft, gravid, appropriate for gestational age. Pain/Pressure: Absent     Pelvic:  Cervical exam deferred        Extremities: Normal range of motion.     ental Status: Normal mood and affect. Normal behavior. Normal judgment and thought content.   Bedside US showed a vanishing twin B gestation. Twin A active with FHR a 165 bpm.   Assessment   42 y.o. G3P1011 at [redacted]w[redacted]d by  10/26/2018, by Est. Date of Conception presenting for routine prenatal visit  Plan   Pregnancy Problems (from 03/19/18 to present)    Problem Noted Resolved   Supervision of high risk pregnancy, antepartum 03/19/2018 by Will Bonnet, MD No   Overview Signed 03/19/2018  1:08 PM by Will Bonnet, MD    Clinic [facility] Prenatal Labs  Dating U/s confirmed ART, embryo transfer Blood type:     Genetic Screen NIPS: [ ]  Panorama collected Antibody:   Anatomic [place]  Rubella:   Varicella: @VZVIGG @  GTT Early:[ ]  completed               Third trimester:  RPR:     Rhogam  HBsAg:     TDaP vaccine                       Flu Shot: HIV:     Baby Food                                GBS:   Contraception  Pap:  CBB     CS/VBAC    Support Person           Supervision of pregnancy resulting from assisted reproductive technology 03/19/2018 by Will Bonnet, MD No  Overview Addendum 03/19/2018  1:04 PM by Will Bonnet, MD    Two blastocyst transfer on 2/28 (day of conception of 02/02/18). Twin gestational verified by ultrasound 3/29 ([redacted]w[redacted]d GA) [ ]  fetal ECHO for twins      Advanced maternal age, primigravida 03/19/2018 by Will Bonnet, MD No   Twin gestation in first trimester 03/19/2018 by Will Bonnet, MD No   Overview Addendum 03/19/2018  1:08 PM by Will Bonnet, MD    - Verify di-di twin gestation. Patient states she has ultrasound report.  - likely di-di due to two embryo transfers to obtain pregnancy  - no  Pre-implantation Genetic Screening done prior to pregnancy. - embryos collected in 2014 and frozen used for this pregnancy. [ ]  start bASA after 12 weeks  Start low dose ASA at >[redacted] weeks gestation as per USPTF recommendation "Low-Dose Aspirin Use for the Prevention of Morbidity and Mortality From Preeclampsia: Preventive Medicine"  furthermore endorsed by ACOG, WHO, and NIH based on evidence level B for the prevention of preeclampsia  In women deemed high risk  (diabetes, renal disease, chronic hypertension, history of preeclampsia in prior gestation, autoimmune diseases, or multifetal gestations).  ACOG Committee Opinion 419 "Low-Dose Asprin Use During Pregnancy" June 25th 2018  High Risk (Start if 1 or more present) History of preeclampsia Multifetal Gestation Chronic HTN Type I or II DM Renal Disease Autoimmune Disease (SLE, Antiphospholipid antibody syndrome)  Moderate Risk (consider starting if more than one present) Nulliparity Obesity (BMI >30) Family history of Preeclampsia (Mother or sister) Socioeconomic characteristics (African American, low socieeconomic status) Age 81 years or older Personal history factors (low birthweight of SGA, previous adverse pregnancy outcome, more than 10 year pregnancy interval)      Obesity affecting pregnancy 03/19/2018 by Will Bonnet, MD No   Overview Signed 03/19/2018  1:03 PM by Will Bonnet, MD    [ ]  early 1h gtt [x]  Offer genetic screening      BMI 34.0-34.9,adult 03/19/2018 by Will Bonnet, MD No   History of preterm delivery, currently pregnant 03/19/2018 by Will Bonnet, MD No   Overview Signed 03/19/2018  1:02 PM by Will Bonnet, MD    - 17OHP not indicated for multiple gestations per ACOG PB130      Nausea and vomiting during pregnancy 03/19/2018 by Will Bonnet, MD No       Gestational age appropriate obstetric precautions including but not limited to vaginal bleeding, contractions, leaking of fluid  and fetal movement were reviewed in detail with the patient.    1GTT and prenatal labs collected today. Panorama sent today for NIPS twin gestation Discussed initiating ASA therapy at 12 weeks. Prescription sent.  Physical therapy for left sided thoracic pain. Bedside Ultrasound showed an empty gestational sac for twin B. This was confirmed with an official transvaginal ultrasound. Return in about 2 weeks (around 04/15/2018) for [place] with MD.  Homero Fellers MD [place], [facility] 04/01/2018, 2:38 PM

## 2018-04-01 NOTE — Progress Notes (Signed)
ROB Early GTT Muscle spasms in back

## 2018-04-02 ENCOUNTER — Encounter: Payer: BLUE CROSS/BLUE SHIELD | Admitting: Obstetrics and Gynecology

## 2018-04-02 ENCOUNTER — Other Ambulatory Visit: Payer: BLUE CROSS/BLUE SHIELD

## 2018-04-05 LAB — RPR+RH+ABO+RUB AB+AB SCR+CB...
Antibody Screen: NEGATIVE
HEMATOCRIT: 40.2 % (ref 34.0–46.6)
HEMOGLOBIN: 13.7 g/dL (ref 11.1–15.9)
HIV SCREEN 4TH GENERATION: NONREACTIVE
MCH: 30.2 pg (ref 26.6–33.0)
MCHC: 34.1 g/dL (ref 31.5–35.7)
MCV: 89 fL (ref 79–97)
Platelets: 290 10*3/uL (ref 150–379)
RBC: 4.54 x10E6/uL (ref 3.77–5.28)
RDW: 14.3 % (ref 12.3–15.4)
RH TYPE: POSITIVE
RPR: NONREACTIVE
Rubella Antibodies, IGG: 29.6 index (ref >0.99)
VARICELLA: 514 {index} (ref >165)
WBC: 8.1 10*3/uL (ref 3.4–10.8)

## 2018-04-05 LAB — HEMOGLOBINOPATHY EVALUATION
HGB C: 0 %
HGB S: 0 %
HGB VARIANT: 0 %
Hemoglobin A2 Quantitation: 2.3 % (ref 1.8–3.2)
Hemoglobin F Quantitation: 0 % (ref 0.0–2.0)
Hgb A: 97.7 % (ref 96.4–98.8)

## 2018-04-05 LAB — HEPATITIS B SURFACE AG, CONFIRM: HBsAg Confirmation: POSITIVE — AB

## 2018-04-05 LAB — GLUCOSE, 1 HOUR GESTATIONAL: GESTATIONAL DIABETES SCREEN: 107 mg/dL (ref 65–139)

## 2018-04-11 ENCOUNTER — Other Ambulatory Visit: Payer: Self-pay | Admitting: Gastroenterology

## 2018-04-11 DIAGNOSIS — B181 Chronic viral hepatitis B without delta-agent: Secondary | ICD-10-CM | POA: Diagnosis not present

## 2018-04-16 ENCOUNTER — Ambulatory Visit (INDEPENDENT_AMBULATORY_CARE_PROVIDER_SITE_OTHER): Payer: BLUE CROSS/BLUE SHIELD | Admitting: Obstetrics & Gynecology

## 2018-04-16 ENCOUNTER — Encounter: Payer: Self-pay | Admitting: Obstetrics & Gynecology

## 2018-04-16 VITALS — BP 130/80 | Wt 178.0 lb

## 2018-04-16 DIAGNOSIS — O09219 Supervision of pregnancy with history of pre-term labor, unspecified trimester: Secondary | ICD-10-CM

## 2018-04-16 DIAGNOSIS — O99211 Obesity complicating pregnancy, first trimester: Secondary | ICD-10-CM

## 2018-04-16 DIAGNOSIS — O0991 Supervision of high risk pregnancy, unspecified, first trimester: Secondary | ICD-10-CM

## 2018-04-16 DIAGNOSIS — O09899 Supervision of other high risk pregnancies, unspecified trimester: Secondary | ICD-10-CM

## 2018-04-16 DIAGNOSIS — O09511 Supervision of elderly primigravida, first trimester: Secondary | ICD-10-CM

## 2018-04-16 DIAGNOSIS — O09819 Supervision of pregnancy resulting from assisted reproductive technology, unspecified trimester: Secondary | ICD-10-CM

## 2018-04-16 DIAGNOSIS — Z3A12 12 weeks gestation of pregnancy: Secondary | ICD-10-CM

## 2018-04-16 MED ORDER — HYDROXYPROGESTERONE CAPROATE 250 MG/ML IM OIL: 250.0000 mg | TOPICAL_OIL | Freq: Once | INTRAMUSCULAR | 4 refills | Status: DC

## 2018-04-16 NOTE — Patient Instructions (Signed)
Hydroxyprogesterone solution for injection What is this medicine? HYDROXYPROGESTERONE (hye drox ee proe JES ter one) is a female hormone. This medicine is used in women who are pregnant and who have delivered a baby too early (preterm) in the past. It helps lower the risk of having a preterm baby again. This medicine may be used for other purposes; ask your health care provider or pharmacist if you have questions. COMMON BRAND NAME(S): Makena What should I tell my health care provider before I take this medicine? They need to know if you have any of these conditions: -blood clotting disorders -breast, cervical, uterine, or vaginal cancer -depression -diabetes or prediabetes -heart disease -high blood pressure -kidney disease -liver disease -lung or breathing disease, like asthma -migraine headaches -seizures -vaginal bleeding -an unusual or allergic reaction to hydroxyprogesterone, other hormones, medicines, foods, dyes, castor oil, benzyl alcohol, or other preservatives -breast-feeding How should I use this medicine? This medicine is for injection into a muscle. It is given by a health care professional in a hospital or clinic setting. You are likely to get an injection once a week to prevent preterm delivery. Talk to your pediatrician regarding the use of this medicine in children. Special care may be needed. Overdosage: If you think you have taken too much of this medicine contact a poison control center or emergency room at once. NOTE: This medicine is only for you. Do not share this medicine with others. What if I miss a dose? It is important not to miss your dose. Call your doctor or health care professional if you are unable to keep an appointment. What may interact with this medicine? -acetaminophen -bupropion -clozapine -efavirenz -halothane -methadone -nicotine -theophylline, aminophylline -tizanidine This list may not describe all possible interactions. Give your  health care provider a list of all the medicines, herbs, non-prescription drugs, or dietary supplements you use. Also tell them if you smoke, drink alcohol, or use illegal drugs. Some items may interact with your medicine. What should I watch for while using this medicine? Your condition will be monitored carefully while you are receiving this medicine. What side effects may I notice from receiving this medicine? Side effects that you should report to your doctor or health care professional as soon as possible: -allergic reactions like skin rash, itching or hives, swelling of the face, lips, or tongue -breathing problems -breast tissue changes or discharge -changes in vision -confusion, trouble speaking or understanding -depressed mood -increased hunger or thirst -increased urination -pain, redness, or irritation at site where injected -pain, swelling, warmth in the leg -shortness of breath, chest pain, swelling in a leg -sudden numbness or weakness of the face, arm or leg -sudden severe headaches -trouble walking, dizziness, loss of balance or coordination -unusually weak or tired -vaginal bleeding -yellowing of the eyes or skin Side effects that usually do not require medical attention (report to your doctor or health care professional if they continue or are bothersome): -changes in emotions or moods -diarrhea -fluid retention and swelling -nausea This list may not describe all possible side effects. Call your doctor for medical advice about side effects. You may report side effects to FDA at 1-800-FDA-1088. Where should I keep my medicine? This drug is given in a hospital or clinic and will not be stored at home. NOTE: This sheet is a summary. It may not cover all possible information. If you have questions about this medicine, talk to your doctor, pharmacist, or health care provider.  2018 Elsevier/Gold Standard (2010-01-18 11:17:12)

## 2018-04-16 NOTE — Progress Notes (Signed)
  Subjective  Fetal Movement? no Contractions? no Leaking Fluid? no Vaginal Bleeding? no Some nausea, improving w Diclegis Objective  BP 130/80   Wt 178 lb (80.7 kg)   LMP 01/12/2018   BMI 33.63 kg/m  General: NAD Pumonary: no increased work of breathing Abdomen: gravid, non-tender Extremities: no edema Psychiatric: mood appropriate, affect full  Assessment  42 y.o. [id number] at [redacted]w[redacted]d by  10/26/2018, by Est. Date of Conception presenting for routine prenatal visit  Plan   Problem List Items Addressed This Visit      Other   Supervision of high risk pregnancy, antepartum   Supervision of pregnancy resulting from assisted reproductive technology   Advanced maternal age, primigravida   Obesity affecting pregnancy   History of preterm delivery, currently pregnant    Other Visit Diagnoses    [redacted] weeks gestation of pregnancy    -  Primary    Prior PTL and now that she has singleton IUP is candidate for 46 OHP; discussed and ordered thru [place] to deliver here by 16 weeks    Pros and cons discussed.    Pt would like administration at home my husband who is Paramedic trained, if possible. Obesity risk factors discussed    Baby ASA started HepB carrier    US liver next week; no other measures or testing required Awaiting Panarama results  Barnett Applebaum, MD, [name], [facility] 04/16/2018  9:09 AM

## 2018-04-17 ENCOUNTER — Ambulatory Visit: Payer: BLUE CROSS/BLUE SHIELD

## 2018-04-22 ENCOUNTER — Encounter: Payer: Self-pay | Admitting: Certified Nurse Midwife

## 2018-04-23 ENCOUNTER — Telehealth: Payer: Self-pay | Admitting: Obstetrics and Gynecology

## 2018-04-23 NOTE — Telephone Encounter (Signed)
Patient is calling for lab results from her office visit on 04/01/18 about an panorama. Please advise

## 2018-04-25 ENCOUNTER — Encounter: Payer: Self-pay | Admitting: Obstetrics and Gynecology

## 2018-04-25 NOTE — Telephone Encounter (Signed)
Pt also sent MyChart message requesting results. Contacted [facility] for results and due to "vanishing twin" written on order form the test was not performed. Results faxed on 04/04/18 per Johnsie Cancel that testing was not completed. Requested [facility] to refax that statement. Please advise. Thank you.

## 2018-04-25 NOTE — Telephone Encounter (Signed)
Can she come in tomorrow for a first trimester screen?

## 2018-04-25 NOTE — Telephone Encounter (Signed)
Pt coming in tomorrow at 3:00 for u/s and f/u with [facility]. Just make sure the NT u/s order is in and I can release it. Thank you!

## 2018-04-26 ENCOUNTER — Ambulatory Visit
Admission: RE | Admit: 2018-04-26 | Discharge: 2018-04-26 | Disposition: A | Discharge status: Home / Self Care | Payer: BLUE CROSS/BLUE SHIELD | Source: Ambulatory Visit | Attending: Gastroenterology | Admitting: Gastroenterology

## 2018-04-26 ENCOUNTER — Other Ambulatory Visit: Payer: Self-pay | Admitting: Obstetrics & Gynecology

## 2018-04-26 ENCOUNTER — Ambulatory Visit (INDEPENDENT_AMBULATORY_CARE_PROVIDER_SITE_OTHER): Payer: BLUE CROSS/BLUE SHIELD | Admitting: Obstetrics & Gynecology

## 2018-04-26 ENCOUNTER — Ambulatory Visit: Payer: BLUE CROSS/BLUE SHIELD

## 2018-04-26 VITALS — BP 120/80 | Wt 179.0 lb

## 2018-04-26 DIAGNOSIS — O09512 Supervision of elderly primigravida, second trimester: Secondary | ICD-10-CM

## 2018-04-26 DIAGNOSIS — Z3A13 13 weeks gestation of pregnancy: Secondary | ICD-10-CM

## 2018-04-26 DIAGNOSIS — Z1371 Encounter for nonprocreative screening for genetic disease carrier status: Secondary | ICD-10-CM

## 2018-04-26 DIAGNOSIS — K829 Disease of gallbladder, unspecified: Secondary | ICD-10-CM | POA: Diagnosis not present

## 2018-04-26 DIAGNOSIS — Z3491 Encounter for supervision of normal pregnancy, unspecified, first trimester: Secondary | ICD-10-CM

## 2018-04-26 DIAGNOSIS — O09819 Supervision of pregnancy resulting from assisted reproductive technology, unspecified trimester: Secondary | ICD-10-CM

## 2018-04-26 DIAGNOSIS — B181 Chronic viral hepatitis B without delta-agent: Secondary | ICD-10-CM

## 2018-04-26 DIAGNOSIS — O0990 Supervision of high risk pregnancy, unspecified, unspecified trimester: Secondary | ICD-10-CM

## 2018-04-26 NOTE — Progress Notes (Signed)
Prenatal Visit Note Date: 04/26/2018 Clinic: [facility]  Subjective:  Laura Espinoza is a 42 y.o. [phone] at [redacted]w[redacted]d being seen today for ongoing prenatal care.  She is currently monitored for the following issues for this high-risk pregnancy and has Allergic rhinitis; Anxiety; Gastritis; Right knee pain; Thoracic back pain; Preventative health care; HBV (hepatitis B virus) infection; Perforated tympanic membrane; Need for hepatitis A vaccination; Asthma; Vaccine for streptococcus pneumoniae and influenza; Visit for screening mammogram; Breast mass, right; Supervision of high risk pregnancy, antepartum; Supervision of pregnancy resulting from assisted reproductive technology; Advanced maternal age, primigravida; Twin gestation in first trimester; Obesity affecting pregnancy; BMI 34.0-34.9,adult; History of preterm delivery, currently pregnant; and Nausea and vomiting during pregnancy on their problem list.  Patient reports no complaints.   Contractions: Not present. Vag. Bleeding: None.   . Denies leaking of fluid.   The following portions of the patient's history were reviewed and updated as appropriate: allergies, current medications, past family history, past medical history, past social history, past surgical history and problem list. Problem list updated.  Objective:   Vitals:   04/26/18 1524  BP: 120/80  Weight: 179 lb (81.2 kg)    Fetal Status:           General:  Alert, oriented and cooperative. Patient is in no acute distress.  Skin: Skin is warm and dry. No rash noted.   Cardiovascular: Normal heart rate noted  Respiratory: Normal respiratory effort, no problems with respiration noted  Abdomen: Soft, gravid, appropriate for gestational age. Pain/Pressure: Absent     Pelvic:  Cervical exam deferred        Extremities: Normal range of motion.     Mental Status: Normal mood and affect. Normal behavior. Normal judgment and thought content.   Urinalysis: Urine Protein: Negative Urine  Glucose: Negative  Assessment and Plan:  Pregnancy: [id number] at [redacted]w[redacted]d  1. Screening for genetic disease carrier status - Panarama unable to be done, possibly due to vanishing twin - First Trimester Screen w/NT today - Based on results will discuss options for amniocentesis or other counseling possibly w MFM     Does not feel she would want amnio  2. [redacted] weeks gestation of pregnancy  3. Supervision of high risk pregnancy, antepartum - Start 17 OHP at 16 weeks weekly - Ok for appts every two weeks with shot to be given to husband to administer in the in between weeks (he is paramedic and has given her fertility shots prior)  4. Supervision of pregnancy resulting from assisted reproductive technology  5. Primigravida of advanced maternal age in second trimester  Preterm labor symptoms and general obstetric precautions including but not limited to vaginal bleeding, contractions, leaking of fluid and fetal movement were reviewed in detail with the patient. Please refer to After Visit Summary for other counseling recommendations.  Return Keep appt Jun 3.  Barnett Applebaum, MD, [name], [facility] 04/26/2018  4:29 PM

## 2018-04-29 ENCOUNTER — Telehealth: Payer: Self-pay | Admitting: Obstetrics & Gynecology

## 2018-04-29 LAB — FIRST TRIMESTER SCREEN W/NT
CRL: 78.3 mm
DIA MOM: 1.07
DIA VALUE: 199.5 pg/mL
Gest Age-Collect: 13.6 weeks
Maternal Age At EDD: 41.1 yr
NUCHAL TRANSLUCENCY MOM: 0.68
Nuchal Translucency: 1.3 mm
Number of Fetuses: 1
PAPP-A MOM: 1.78
PAPP-A VALUE: 1882.2 ng/mL
Test Results:: NEGATIVE
WEIGHT: 179 [lb_av]
hCG MoM: 1.4
hCG Value: 98.8 IU/mL

## 2018-04-29 NOTE — Telephone Encounter (Signed)
Patient is calling for results. Please advise  °

## 2018-04-29 NOTE — Progress Notes (Signed)
L/M to call.

## 2018-05-09 ENCOUNTER — Encounter: Payer: Self-pay | Admitting: Obstetrics & Gynecology

## 2018-05-13 ENCOUNTER — Encounter: Payer: BLUE CROSS/BLUE SHIELD | Admitting: Obstetrics and Gynecology

## 2018-05-13 ENCOUNTER — Ambulatory Visit (INDEPENDENT_AMBULATORY_CARE_PROVIDER_SITE_OTHER): Payer: BLUE CROSS/BLUE SHIELD | Admitting: Obstetrics and Gynecology

## 2018-05-13 ENCOUNTER — Encounter: Payer: Self-pay | Admitting: Obstetrics and Gynecology

## 2018-05-13 VITALS — BP 122/72 | Wt 177.0 lb

## 2018-05-13 DIAGNOSIS — Z8751 Personal history of pre-term labor: Secondary | ICD-10-CM | POA: Diagnosis not present

## 2018-05-13 DIAGNOSIS — Z3A16 16 weeks gestation of pregnancy: Secondary | ICD-10-CM | POA: Diagnosis not present

## 2018-05-13 DIAGNOSIS — O0992 Supervision of high risk pregnancy, unspecified, second trimester: Secondary | ICD-10-CM

## 2018-05-13 DIAGNOSIS — J40 Bronchitis, not specified as acute or chronic: Secondary | ICD-10-CM

## 2018-05-13 DIAGNOSIS — O0990 Supervision of high risk pregnancy, unspecified, unspecified trimester: Secondary | ICD-10-CM

## 2018-05-13 DIAGNOSIS — O09819 Supervision of pregnancy resulting from assisted reproductive technology, unspecified trimester: Secondary | ICD-10-CM

## 2018-05-13 MED ORDER — HYDROXYPROGESTERONE CAPROATE 250 MG/ML IM OIL
250.0000 mg | TOPICAL_OIL | Freq: Once | INTRAMUSCULAR | Status: AC
  Administered 2018-05-13: 250 mg via INTRAMUSCULAR

## 2018-05-13 MED ORDER — AZITHROMYCIN 250 MG PO TABS: ORAL_TABLET | ORAL | 1 refills | Status: DC
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
## 2018-05-27 ENCOUNTER — Ambulatory Visit (INDEPENDENT_AMBULATORY_CARE_PROVIDER_SITE_OTHER): Payer: BLUE CROSS/BLUE SHIELD

## 2018-05-27 ENCOUNTER — Ambulatory Visit (INDEPENDENT_AMBULATORY_CARE_PROVIDER_SITE_OTHER): Payer: BLUE CROSS/BLUE SHIELD | Admitting: Obstetrics and Gynecology

## 2018-05-27 ENCOUNTER — Encounter: Payer: Self-pay | Admitting: Obstetrics and Gynecology

## 2018-05-27 VITALS — BP 122/72 | Wt 178.0 lb

## 2018-05-27 DIAGNOSIS — O09899 Supervision of other high risk pregnancies, unspecified trimester: Secondary | ICD-10-CM

## 2018-05-27 DIAGNOSIS — O09819 Supervision of pregnancy resulting from assisted reproductive technology, unspecified trimester: Secondary | ICD-10-CM

## 2018-05-27 DIAGNOSIS — O09212 Supervision of pregnancy with history of pre-term labor, second trimester: Secondary | ICD-10-CM | POA: Diagnosis not present

## 2018-05-27 DIAGNOSIS — Z3A18 18 weeks gestation of pregnancy: Secondary | ICD-10-CM | POA: Diagnosis not present

## 2018-05-27 DIAGNOSIS — O09812 Supervision of pregnancy resulting from assisted reproductive technology, second trimester: Secondary | ICD-10-CM

## 2018-05-27 DIAGNOSIS — O0992 Supervision of high risk pregnancy, unspecified, second trimester: Secondary | ICD-10-CM

## 2018-05-27 DIAGNOSIS — J3089 Other allergic rhinitis: Secondary | ICD-10-CM

## 2018-05-27 DIAGNOSIS — O99212 Obesity complicating pregnancy, second trimester: Secondary | ICD-10-CM

## 2018-05-27 DIAGNOSIS — O0990 Supervision of high risk pregnancy, unspecified, unspecified trimester: Secondary | ICD-10-CM

## 2018-05-27 DIAGNOSIS — O09512 Supervision of elderly primigravida, second trimester: Secondary | ICD-10-CM

## 2018-05-27 DIAGNOSIS — Z8751 Personal history of pre-term labor: Secondary | ICD-10-CM | POA: Diagnosis not present

## 2018-05-27 DIAGNOSIS — O09219 Supervision of pregnancy with history of pre-term labor, unspecified trimester: Secondary | ICD-10-CM

## 2018-05-27 MED ORDER — HYDROXYPROGESTERONE CAPROATE 250 MG/ML IM OIL
250.0000 mg | TOPICAL_OIL | Freq: Once | INTRAMUSCULAR | Status: AC
  Administered 2018-05-27: 250 mg via INTRAMUSCULAR

## 2018-05-27 NOTE — Progress Notes (Signed)
Pt reports seasonal allergies and nasal congestion. Anatomy scan today. 17 P given in office today. Pt's husband gives her 17P every other week and every other week done in office. Per patient last injection given on Mon 05/20/18 left upper outer quadrant.

## 2018-05-27 NOTE — Progress Notes (Signed)
Routine Prenatal Care Visit  Subjective  Laura Espinoza is a 42 y.o. [phone] at [redacted]w[redacted]d being seen today for ongoing prenatal care.  She is currently monitored for the following issues for this high-risk pregnancy and has Allergic rhinitis; Anxiety; Gastritis; Right knee pain; Thoracic back pain; Preventative health care; HBV (hepatitis B virus) infection; Perforated tympanic membrane; Need for hepatitis A vaccination; Asthma; Vaccine for streptococcus pneumoniae and influenza; Visit for screening mammogram; Breast mass, right; Supervision of high risk pregnancy, antepartum; Supervision of pregnancy resulting from assisted reproductive technology; Advanced maternal age, primigravida; Twin gestation in first trimester; Obesity affecting pregnancy; BMI 34.0-34.9,adult; History of preterm delivery, currently pregnant; and Nausea and vomiting during pregnancy on their problem list.  ----------------------------------------------------------------------------------- Patient reports no complaints.   Contractions: Not present. Vag. Bleeding: None.  Movement: Present. Denies leaking of fluid.  ----------------------------------------------------------------------------------- The following portions of the patient's history were reviewed and updated as appropriate: allergies, current medications, past family history, past medical history, past social history, past surgical history and problem list. Problem list updated.   Objective  Blood pressure 122/72, weight 178 lb (80.7 kg), last menstrual period 01/12/2018. Pregravid weight 181 lb (82.1 kg) Total Weight Gain -3 lb (-1.361 kg) Urinalysis: Urine Protein: Negative Urine Glucose: Negative  Fetal Status: Fetal Heart Rate (bpm): 152   Movement: Present     General:  Alert, oriented and cooperative. Patient is in no acute distress.  Skin: Skin is warm and dry. No rash noted.   Cardiovascular: Normal heart rate noted  Respiratory: Normal respiratory  effort, no problems with respiration noted  Abdomen: Soft, gravid, appropriate for gestational age. Pain/Pressure: Absent     Pelvic:  Cervical exam deferred        Extremities: Normal range of motion.     ental Status: Normal mood and affect. Normal behavior. Normal judgment and thought content.     Assessment   42 y.o. [id number] at [redacted]w[redacted]d by  10/26/2018, by Est. Date of Conception presenting for routine prenatal visit  Plan   Pregnancy Problems (from 03/19/18 to present)    Problem Noted Resolved   Supervision of high risk pregnancy, antepartum 03/19/2018 by Will Bonnet, MD No   Overview Addendum 05/13/2018  4:46 PM by Homero Fellers, [place] Prenatal Labs  Dating U/s confirmed ART, embryo transfer Blood type: B/Positive/-- (04/22 1036)   Genetic Screen First trimester screen: negative Antibody:Negative (04/22 1036)  Anatomic [place]  Rubella: 29.60 (04/22 1036) Varicella: Immune  GTT Early: 10             Third trimester:  RPR: Non Reactive (04/22 1036)   Rhogam Not applicable HBsAg: Confirm. indicated (04/22 1036)   TDaP vaccine                        Flu Shot: HIV: Non Reactive (04/22 1036)   Baby Food                                GBS:   Contraception  Pap: 2018 NIL, HPV negative  CBB     CS/VBAC    Support Person           Supervision of pregnancy resulting from assisted reproductive technology 03/19/2018 by Will Bonnet, MD No   Overview Addendum 05/27/2018  4:28 PM by Homero Fellers, MD    Two blastocyst transfer  on 2/28 (day of conception of 02/02/18). Twin gestational verified by ultrasound 3/29 ([redacted]w[redacted]d GA) [ ]  fetal ECHO       Advanced maternal age, primigravida 03/19/2018 by Will Bonnet, MD No   Twin gestation in first trimester 03/19/2018 by Will Bonnet, MD No   Overview Addendum 03/19/2018  1:08 PM by Will Bonnet, MD    - Verify di-di twin gestation. Patient states she has ultrasound report.  - likely di-di due to  two embryo transfers to obtain pregnancy  - no Pre-implantation Genetic Screening done prior to pregnancy. - embryos collected in 2014 and frozen used for this pregnancy. [ ]  start bASA after 12 weeks  Start low dose ASA at >[redacted] weeks gestation as per USPTF recommendation "Low-Dose Aspirin Use for the Prevention of Morbidity and Mortality From Preeclampsia: Preventive Medicine"  furthermore endorsed by ACOG, WHO, and NIH based on evidence level B for the prevention of preeclampsia  In women deemed high risk  (diabetes, renal disease, chronic hypertension, history of preeclampsia in prior gestation, autoimmune diseases, or multifetal gestations).  ACOG Committee Opinion 213 "Low-Dose Asprin Use During Pregnancy" June 25th 2018  High Risk (Start if 1 or more present) History of preeclampsia Multifetal Gestation Chronic HTN Type I or II DM Renal Disease Autoimmune Disease (SLE, Antiphospholipid antibody syndrome)  Moderate Risk (consider starting if more than one present) Nulliparity Obesity (BMI >30) Family history of Preeclampsia (Mother or sister) Socioeconomic characteristics (African American, low socieeconomic status) Age 59 years or older Personal history factors (low birthweight of SGA, previous adverse pregnancy outcome, more than 10 year pregnancy interval)      Obesity affecting pregnancy 03/19/2018 by Will Bonnet, MD No   Overview Signed 03/19/2018  1:03 PM by Will Bonnet, MD    [ ]  early 1h gtt [x]  Offer genetic screening      BMI 34.0-34.9,adult 03/19/2018 by Will Bonnet, MD No   History of preterm delivery, currently pregnant 03/19/2018 by Will Bonnet, MD No   Overview Signed 03/19/2018  1:02 PM by Will Bonnet, MD    - 17OHP not indicated for multiple gestations per ACOG PB130      Nausea and vomiting during pregnancy 03/19/2018 by Will Bonnet, MD No       Gestational age appropriate obstetric precautions including but not limited to  vaginal bleeding, contractions, leaking of fluid and fetal movement were reviewed in detail with the patient.    Anatomy US today normal. Upcoming Fetal echo is scheduled.  Discussed Flonase and Claritin PRN for nasal congestion. Patient continuing 17-OHP weekly injection, every other week in office and at home.  Given information on prenatal classes with [facility].  Given information on birthcontrol options postpartum. Recommended [email or web address].   Discussed breast feeding. Patient is planning on breast feeding. Patient was given resources and lactation consultation was advised. She breast feed her last child.   Return in about 2 weeks (around 06/10/2018) for ROB.  Adrian Prows MD [facility], [place] [facility] 05/27/18 4:29 PM

## 2018-05-28 ENCOUNTER — Encounter: Payer: BLUE CROSS/BLUE SHIELD | Admitting: Advanced Practice Midwife

## 2018-06-10 ENCOUNTER — Ambulatory Visit (INDEPENDENT_AMBULATORY_CARE_PROVIDER_SITE_OTHER): Payer: BLUE CROSS/BLUE SHIELD | Admitting: Advanced Practice Midwife

## 2018-06-10 ENCOUNTER — Encounter: Payer: Self-pay | Admitting: Advanced Practice Midwife

## 2018-06-10 VITALS — BP 120/68 | Wt 182.0 lb

## 2018-06-10 DIAGNOSIS — O09892 Supervision of other high risk pregnancies, second trimester: Secondary | ICD-10-CM

## 2018-06-10 DIAGNOSIS — Z3A20 20 weeks gestation of pregnancy: Secondary | ICD-10-CM | POA: Diagnosis not present

## 2018-06-10 DIAGNOSIS — O09212 Supervision of pregnancy with history of pre-term labor, second trimester: Secondary | ICD-10-CM | POA: Diagnosis not present

## 2018-06-10 MED ORDER — HYDROXYPROGESTERONE CAPROATE 250 MG/ML IM OIL
250.0000 mg | TOPICAL_OIL | Freq: Once | INTRAMUSCULAR | Status: AC
  Administered 2018-06-10: 250 mg via INTRAMUSCULAR

## 2018-06-10 NOTE — Progress Notes (Signed)
ROB

## 2018-06-10 NOTE — Progress Notes (Signed)
Routine Prenatal Care Visit  Subjective  Laura Espinoza is a 42 y.o. [phone] at [redacted]w[redacted]d being seen today for ongoing prenatal care.  She is currently monitored for the following issues for this high-risk pregnancy and has Allergic rhinitis; Anxiety; Gastritis; Right knee pain; Thoracic back pain; Preventative health care; HBV (hepatitis B virus) infection; Perforated tympanic membrane; Need for hepatitis A vaccination; Asthma; Vaccine for streptococcus pneumoniae and influenza; Visit for screening mammogram; Breast mass, right; Supervision of high risk pregnancy, antepartum; Supervision of pregnancy resulting from assisted reproductive technology; Advanced maternal age, primigravida; Twin gestation in first trimester; Obesity affecting pregnancy; BMI 34.0-34.9,adult; History of preterm delivery, currently pregnant; and Nausea and vomiting during pregnancy on their problem list.  ----------------------------------------------------------------------------------- Patient reports no complaints.  She is seen here every 2 weeks and has 17P injection here and every other week home injection.  Contractions: Not present. Vag. Bleeding: None.  Movement: Present. Denies leaking of fluid.  ----------------------------------------------------------------------------------- The following portions of the patient's history were reviewed and updated as appropriate: allergies, current medications, past family history, past medical history, past social history, past surgical history and problem list. Problem list updated.   Objective  Blood pressure 120/68, weight 182 lb (82.6 kg), last menstrual period 01/12/2018. Pregravid weight 181 lb (82.1 kg) Total Weight Gain 1 lb (0.454 kg) Urinalysis: Urine Protein: Negative Urine Glucose: Negative  Fetal Status: Fetal Heart Rate (bpm): 140 Fundal Height: 21 cm Movement: Present     General:  Alert, oriented and cooperative. Patient is in no acute distress.  Skin: Skin is  warm and dry. No rash noted.   Cardiovascular: Normal heart rate noted  Respiratory: Normal respiratory effort, no problems with respiration noted  Abdomen: Soft, gravid, appropriate for gestational age. Pain/Pressure: Absent     Pelvic:  Cervical exam deferred        Extremities: Normal range of motion.     Mental Status: Normal mood and affect. Normal behavior. Normal judgment and thought content.   Assessment   42 y.o. [id number] at [redacted]w[redacted]d by  10/26/2018, by Est. Date of Conception presenting for routine prenatal visit  Plan   Pregnancy Problems (from 03/19/18 to present)    Problem Noted Resolved   Supervision of high risk pregnancy, antepartum 03/19/2018 by Will Bonnet, MD No   Overview Addendum 05/27/2018  4:31 PM by Homero Fellers, MD    Clinic [facility] Prenatal Labs  Dating U/s confirmed ART, embryo transfer Blood type: B/Positive/-- (04/22 1036)   Genetic Screen First trimester screen: negative Antibody:Negative (04/22 1036)  Anatomic [place] complete Rubella: 29.60 (04/22 1036) Varicella: Immune  GTT Early: 82             Third trimester:  RPR: Non Reactive (04/22 1036)   Rhogam Not applicable HBsAg: Confirm. indicated (04/22 1036)   TDaP vaccine                        Flu Shot: HIV: Non Reactive (04/22 1036)   Baby Food   Breast                             GBS:   Contraception  Given information Pap: 2018 NIL, HPV negative  CBB     CS/VBAC    Support Person           Supervision of pregnancy resulting from assisted reproductive technology 03/19/2018 by Will Bonnet, MD No  Overview Addendum 05/27/2018  4:28 PM by Homero Fellers, MD    Two blastocyst transfer on 2/28 (day of conception of 02/02/18). Twin gestational verified by ultrasound 3/29 ([redacted]w[redacted]d GA) [ ]  fetal ECHO       Advanced maternal age, primigravida 03/19/2018 by Will Bonnet, MD No   Twin gestation in first trimester 03/19/2018 by Will Bonnet, MD No   Overview Addendum 03/19/2018   1:08 PM by Will Bonnet, MD    - Verify di-di twin gestation. Patient states she has ultrasound report.  - likely di-di due to two embryo transfers to obtain pregnancy  - no Pre-implantation Genetic Screening done prior to pregnancy. - embryos collected in 2014 and frozen used for this pregnancy. [ ]  start bASA after 12 weeks  Start low dose ASA at >[redacted] weeks gestation as per USPTF recommendation "Low-Dose Aspirin Use for the Prevention of Morbidity and Mortality From Preeclampsia: Preventive Medicine"  furthermore endorsed by ACOG, WHO, and NIH based on evidence level B for the prevention of preeclampsia  In women deemed high risk  (diabetes, renal disease, chronic hypertension, history of preeclampsia in prior gestation, autoimmune diseases, or multifetal gestations).  ACOG Committee Opinion 712 "Low-Dose Asprin Use During Pregnancy" June 25th 2018  High Risk (Start if 1 or more present) History of preeclampsia Multifetal Gestation Chronic HTN Type I or II DM Renal Disease Autoimmune Disease (SLE, Antiphospholipid antibody syndrome)  Moderate Risk (consider starting if more than one present) Nulliparity Obesity (BMI >30) Family history of Preeclampsia (Mother or sister) Socioeconomic characteristics (African American, low socieeconomic status) Age 72 years or older Personal history factors (low birthweight of SGA, previous adverse pregnancy outcome, more than 10 year pregnancy interval)      Obesity affecting pregnancy 03/19/2018 by Will Bonnet, MD No   Overview Signed 03/19/2018  1:03 PM by Will Bonnet, MD    [ ]  early 1h gtt [x]  Offer genetic screening      BMI 34.0-34.9,adult 03/19/2018 by Will Bonnet, MD No   History of preterm delivery, currently pregnant 03/19/2018 by Will Bonnet, MD No   Overview Signed 03/19/2018  1:02 PM by Will Bonnet, MD    - 17OHP not indicated for multiple gestations per ACOG PB130      Nausea and vomiting during  pregnancy 03/19/2018 by Will Bonnet, MD No       Preterm labor symptoms and general obstetric precautions including but not limited to vaginal bleeding, contractions, leaking of fluid and fetal movement were reviewed in detail with the patient.   Return in about 2 weeks (around 06/24/2018) for rob.  Rod Can, CNM 06/10/2018 8:31 AM

## 2018-06-24 ENCOUNTER — Ambulatory Visit (INDEPENDENT_AMBULATORY_CARE_PROVIDER_SITE_OTHER): Payer: BLUE CROSS/BLUE SHIELD | Admitting: Obstetrics & Gynecology

## 2018-06-24 VITALS — BP 130/74 | Wt 181.0 lb

## 2018-06-24 DIAGNOSIS — O09219 Supervision of pregnancy with history of pre-term labor, unspecified trimester: Secondary | ICD-10-CM

## 2018-06-24 DIAGNOSIS — O09212 Supervision of pregnancy with history of pre-term labor, second trimester: Secondary | ICD-10-CM | POA: Diagnosis not present

## 2018-06-24 DIAGNOSIS — Z3A22 22 weeks gestation of pregnancy: Secondary | ICD-10-CM

## 2018-06-24 DIAGNOSIS — O09899 Supervision of other high risk pregnancies, unspecified trimester: Secondary | ICD-10-CM

## 2018-06-24 DIAGNOSIS — O0990 Supervision of high risk pregnancy, unspecified, unspecified trimester: Secondary | ICD-10-CM

## 2018-06-24 DIAGNOSIS — O09512 Supervision of elderly primigravida, second trimester: Secondary | ICD-10-CM

## 2018-06-24 MED ORDER — HYDROXYPROGESTERONE CAPROATE 250 MG/ML IM OIL
250.0000 mg | TOPICAL_OIL | Freq: Once | INTRAMUSCULAR | Status: AC
  Administered 2018-06-24: 250 mg via INTRAMUSCULAR

## 2018-06-24 NOTE — Progress Notes (Signed)
ROB 17P given Heartburn [place] pain in abdomen

## 2018-06-24 NOTE — Progress Notes (Signed)
  Subjective  Fetal Movement? yes Contractions? no Leaking Fluid? no Vaginal Bleeding? no GERD, discussed Objective  BP 130/74   Wt 181 lb (82.1 kg)   LMP 01/12/2018   BMI 34.20 kg/m  General: NAD Pumonary: no increased work of breathing Abdomen: gravid, non-tender Extremities: no edema Psychiatric: mood appropriate, affect full  Assessment  42 y.o. [id number] at [redacted]w[redacted]d by  10/26/2018, by Est. Date of Conception presenting for routine prenatal visit  Plan   Problem List Items Addressed This Visit      Other   Supervision of high risk pregnancy, antepartum   Advanced maternal age, primigravida   History of preterm delivery, currently pregnant   Relevant Medications   hydroxyprogesterone caproate (MAKENA) 250 mg/mL injection 250 mg (Completed)    Other Visit Diagnoses    [redacted] weeks gestation of pregnancy    -  Primary    ECHO soon, discussed (for AMA) 17P weekly Baby ASA, weight gain is appropriate  Barnett Applebaum, MD, [name], [facility] 06/24/2018  8:56 AM

## 2018-06-27 DIAGNOSIS — O09812 Supervision of pregnancy resulting from assisted reproductive technology, second trimester: Secondary | ICD-10-CM | POA: Diagnosis not present

## 2018-06-27 DIAGNOSIS — O368320 Maternal care for abnormalities of the fetal heart rate or rhythm, second trimester, not applicable or unspecified: Secondary | ICD-10-CM | POA: Diagnosis not present

## 2018-06-27 DIAGNOSIS — Z3A22 22 weeks gestation of pregnancy: Secondary | ICD-10-CM | POA: Diagnosis not present

## 2018-07-08 ENCOUNTER — Ambulatory Visit (INDEPENDENT_AMBULATORY_CARE_PROVIDER_SITE_OTHER): Payer: BLUE CROSS/BLUE SHIELD | Admitting: Obstetrics & Gynecology

## 2018-07-08 VITALS — BP 120/80 | Wt 184.0 lb

## 2018-07-08 DIAGNOSIS — Z8751 Personal history of pre-term labor: Secondary | ICD-10-CM | POA: Diagnosis not present

## 2018-07-08 DIAGNOSIS — O09212 Supervision of pregnancy with history of pre-term labor, second trimester: Secondary | ICD-10-CM | POA: Diagnosis not present

## 2018-07-08 DIAGNOSIS — O0990 Supervision of high risk pregnancy, unspecified, unspecified trimester: Secondary | ICD-10-CM

## 2018-07-08 DIAGNOSIS — Z3A24 24 weeks gestation of pregnancy: Secondary | ICD-10-CM | POA: Diagnosis not present

## 2018-07-08 MED ORDER — HYDROXYPROGESTERONE CAPROATE 250 MG/ML IM OIL
250.0000 mg | TOPICAL_OIL | Freq: Once | INTRAMUSCULAR | Status: AC
  Administered 2018-07-08: 250 mg via INTRAMUSCULAR

## 2018-07-08 NOTE — Progress Notes (Signed)
  Subjective  Fetal Movement? yes Contractions? Yes, irreg Leaking Fluid? no Vaginal Bleeding? no H/o PTL/PTD 36 weeks    Makena weekly Objective  BP 120/80   Wt 184 lb (83.5 kg)   LMP 01/12/2018   BMI 34.77 kg/m  General: NAD Pumonary: no increased work of breathing Abdomen: gravid, non-tender Extremities: no edema Psychiatric: mood appropriate, affect full  Assessment  42 y.o. [id number] at [redacted]w[redacted]d by  10/26/2018, by Est. Date of Conception presenting for routine prenatal visit  Plan   Problem List Items Addressed This Visit      Other   Supervision of high risk pregnancy, antepartum    Other Visit Diagnoses    History of preterm delivery    -  Primary   Relevant Orders   Fetal fibronectin   [redacted] weeks gestation of pregnancy       Relevant Orders   28 Week RH+Panel   Fetal fibronectin    Labs 2 weeks. Makena weekly If fFN pos, then rest and steroids  Barnett Applebaum, MD, [place], [facility] 07/08/2018  4:39 PM

## 2018-07-09 LAB — FETAL FIBRONECTIN: Fetal Fibronectin: NEGATIVE

## 2018-07-22 ENCOUNTER — Ambulatory Visit (INDEPENDENT_AMBULATORY_CARE_PROVIDER_SITE_OTHER): Payer: BLUE CROSS/BLUE SHIELD | Admitting: Maternal Newborn

## 2018-07-22 ENCOUNTER — Encounter: Payer: Self-pay | Admitting: Maternal Newborn

## 2018-07-22 ENCOUNTER — Other Ambulatory Visit: Payer: BLUE CROSS/BLUE SHIELD

## 2018-07-22 VITALS — BP 116/68 | Wt 185.0 lb

## 2018-07-22 DIAGNOSIS — O09522 Supervision of elderly multigravida, second trimester: Secondary | ICD-10-CM

## 2018-07-22 DIAGNOSIS — O09212 Supervision of pregnancy with history of pre-term labor, second trimester: Secondary | ICD-10-CM | POA: Diagnosis not present

## 2018-07-22 DIAGNOSIS — Z3A26 26 weeks gestation of pregnancy: Secondary | ICD-10-CM | POA: Diagnosis not present

## 2018-07-22 DIAGNOSIS — Z8751 Personal history of pre-term labor: Secondary | ICD-10-CM

## 2018-07-22 DIAGNOSIS — O30042 Twin pregnancy, dichorionic/diamniotic, second trimester: Secondary | ICD-10-CM

## 2018-07-22 DIAGNOSIS — O0990 Supervision of high risk pregnancy, unspecified, unspecified trimester: Secondary | ICD-10-CM

## 2018-07-22 DIAGNOSIS — Z3A24 24 weeks gestation of pregnancy: Secondary | ICD-10-CM | POA: Diagnosis not present

## 2018-07-22 LAB — POCT URINALYSIS DIPSTICK OB: Glucose, UA: NEGATIVE — AB

## 2018-07-22 MED ORDER — HYDROXYPROGESTERONE CAPROATE 250 MG/ML IM OIL
250.0000 mg | TOPICAL_OIL | Freq: Once | INTRAMUSCULAR | Status: AC
  Administered 2018-07-22: 250 mg via INTRAMUSCULAR

## 2018-07-22 NOTE — Progress Notes (Signed)
Routine Prenatal Care Visit  Subjective  Laura Espinoza is a 42 y.o. [phone] at [redacted]w[redacted]d being seen today for ongoing prenatal care.  She is currently monitored for the following issues for this high-risk pregnancy and has Allergic rhinitis; Anxiety; Gastritis; Right knee pain; Thoracic back pain; Preventative health care; HBV (hepatitis B virus) infection; Perforated tympanic membrane; Need for hepatitis A vaccination; Asthma; Vaccine for streptococcus pneumoniae and influenza; Visit for screening mammogram; Breast mass, right; Supervision of high risk pregnancy, antepartum; Supervision of pregnancy resulting from assisted reproductive technology; Advanced maternal age, primigravida; Twin gestation in first trimester; Obesity affecting pregnancy; BMI 34.0-34.9,adult; History of preterm delivery, currently pregnant; and Nausea and vomiting during pregnancy on their problem list.  ----------------------------------------------------------------------------------- Patient reports fatigue. Improving with iron supplementation. Has had some shortness of breath; no chest pain or difficulty breathing.   Contractions: Not present. Vag. Bleeding: None.  Movement: Present. No leaking of fluid.  ----------------------------------------------------------------------------------- The following portions of the patient's history were reviewed and updated as appropriate: allergies, current medications, past family history, past medical history, past social history, past surgical history and problem list. Problem list updated.  Objective  Blood pressure 116/68, weight 185 lb (83.9 kg), last menstrual period 01/12/2018. Pregravid weight 181 lb (82.1 kg) Total Weight Gain 4 lb (1.814 kg) Body mass index is 34.96 kg/m. Urinalysis: Protein Trace, Glucose Negative Fetal Status: Fetal Heart Rate (bpm): 142 Fundal Height: 26 cm Movement: Present     General:  Alert, oriented and cooperative. Patient is in no acute  distress.  Skin: Skin is warm and dry. No rash noted.   Cardiovascular: Normal heart rate noted  Respiratory: Normal respiratory effort, no problems with respiration noted  Abdomen: Soft, gravid, appropriate for gestational age. Pain/Pressure: Absent     Pelvic:  Cervical exam deferred        Extremities: Normal range of motion.  Edema: None  Mental Status: Normal mood and affect. Normal behavior. Normal judgment and thought content.     Assessment   42 y.o. [id number] at [redacted]w[redacted]d, EDD 10/26/2018 by Est. Date of Conception presenting for routine prenatal visit.  Plan   Pregnancy Problems (from 03/19/18 to present)    Problem Noted Resolved   Supervision of high risk pregnancy, antepartum 03/19/2018 by Will Bonnet, MD No   Overview Addendum 05/27/2018  4:31 PM by Homero Fellers, MD    Clinic [facility] Prenatal Labs  Dating U/s confirmed ART, embryo transfer Blood type: B/Positive/-- (04/22 1036)   Genetic Screen First trimester screen: negative Antibody:Negative (04/22 1036)  Anatomic [place] complete Rubella: 29.60 (04/22 1036) Varicella: Immune  GTT Early: 4             Third trimester:  RPR: Non Reactive (04/22 1036)   Rhogam Not applicable HBsAg: Confirm. indicated (04/22 1036)   TDaP vaccine                        Flu Shot: HIV: Non Reactive (04/22 1036)   Baby Food   Breast                             GBS:   Contraception  Given information Pap: 2018 NIL, HPV negative  CBB     CS/VBAC    Support Person           Supervision of pregnancy resulting from assisted reproductive technology 03/19/2018 by Will Bonnet, MD  No   Overview Addendum 05/27/2018  4:28 PM by Homero Fellers, MD    Two blastocyst transfer on 2/28 (day of conception of 02/02/18). Twin gestational verified by ultrasound 3/29 ([redacted]w[redacted]d GA) [ ]  fetal ECHO       Advanced maternal age, primigravida 03/19/2018 by Will Bonnet, MD No   Twin gestation in first trimester 03/19/2018 by Will Bonnet, MD No   Overview Addendum 03/19/2018  1:08 PM by Will Bonnet, MD    - Verify di-di twin gestation. Patient states she has ultrasound report.  - likely di-di due to two embryo transfers to obtain pregnancy  - no Pre-implantation Genetic Screening done prior to pregnancy. - embryos collected in 2014 and frozen used for this pregnancy. [ ]  start bASA after 12 weeks  Start low dose ASA at >[redacted] weeks gestation as per USPTF recommendation "Low-Dose Aspirin Use for the Prevention of Morbidity and Mortality From Preeclampsia: Preventive Medicine"  furthermore endorsed by ACOG, WHO, and NIH based on evidence level B for the prevention of preeclampsia  In women deemed high risk  (diabetes, renal disease, chronic hypertension, history of preeclampsia in prior gestation, autoimmune diseases, or multifetal gestations).  ACOG Committee Opinion 284 "Low-Dose Asprin Use During Pregnancy" June 25th 2018  High Risk (Start if 1 or more present) History of preeclampsia Multifetal Gestation Chronic HTN Type I or II DM Renal Disease Autoimmune Disease (SLE, Antiphospholipid antibody syndrome)  Moderate Risk (consider starting if more than one present) Nulliparity Obesity (BMI >30) Family history of Preeclampsia (Mother or sister) Socioeconomic characteristics (African American, low socieeconomic status) Age 3 years or older Personal history factors (low birthweight of SGA, previous adverse pregnancy outcome, more than 10 year pregnancy interval)      Obesity affecting pregnancy 03/19/2018 by Will Bonnet, MD No   Overview Signed 03/19/2018  1:03 PM by Will Bonnet, MD    [ ]  early 1h gtt [x]  Offer genetic screening      BMI 34.0-34.9,adult 03/19/2018 by Will Bonnet, MD No   History of preterm delivery, currently pregnant 03/19/2018 by Will Bonnet, MD No   Overview Signed 03/19/2018  1:02 PM by Will Bonnet, MD    - 17OHP not indicated for multiple gestations per ACOG  PB130      Nausea and vomiting during pregnancy 03/19/2018 by Will Bonnet, MD No    GTT and 28 week labs today.  Gestational age appropriate obstetric precautions were reviewed.  Return in about 2 weeks (around 08/05/2018) for [employer].  Avel Sensor, CNM 07/22/2018  9:03 AM

## 2018-07-23 LAB — 28 WEEK RH+PANEL
BASOS ABS: 0 10*3/uL (ref 0.0–0.2)
BASOS: 0 %
EOS (ABSOLUTE): 0.1 10*3/uL (ref 0.0–0.4)
EOS: 1 %
GESTATIONAL DIABETES SCREEN: 127 mg/dL (ref 65–139)
HEMATOCRIT: 37.9 % (ref 34.0–46.6)
HIV Screen 4th Generation wRfx: NONREACTIVE
Hemoglobin: 12.9 g/dL (ref 11.1–15.9)
IMMATURE GRANULOCYTES: 1 %
Immature Grans (Abs): 0.1 10*3/uL (ref 0.0–0.1)
LYMPHS ABS: 1.4 10*3/uL (ref 0.7–3.1)
Lymphs: 15 %
MCH: 31.2 pg (ref 26.6–33.0)
MCHC: 34 g/dL (ref 31.5–35.7)
MCV: 92 fL (ref 79–97)
MONOS ABS: 0.5 10*3/uL (ref 0.1–0.9)
Monocytes: 5 %
NEUTROS PCT: 78 %
Neutrophils Absolute: 7.6 10*3/uL — ABNORMAL HIGH (ref 1.4–7.0)
PLATELETS: 294 10*3/uL (ref 150–450)
RBC: 4.14 x10E6/uL (ref 3.77–5.28)
RDW: 14.8 % (ref 12.3–15.4)
RPR: NONREACTIVE
WBC: 9.6 10*3/uL (ref 3.4–10.8)

## 2018-08-01 DIAGNOSIS — B181 Chronic viral hepatitis B without delta-agent: Secondary | ICD-10-CM | POA: Diagnosis not present

## 2018-08-05 ENCOUNTER — Encounter: Payer: Self-pay | Admitting: Obstetrics and Gynecology

## 2018-08-05 ENCOUNTER — Ambulatory Visit (INDEPENDENT_AMBULATORY_CARE_PROVIDER_SITE_OTHER): Payer: BLUE CROSS/BLUE SHIELD | Admitting: Obstetrics and Gynecology

## 2018-08-05 VITALS — BP 140/80 | Wt 186.0 lb

## 2018-08-05 DIAGNOSIS — O09219 Supervision of pregnancy with history of pre-term labor, unspecified trimester: Secondary | ICD-10-CM

## 2018-08-05 DIAGNOSIS — Z3A28 28 weeks gestation of pregnancy: Secondary | ICD-10-CM

## 2018-08-05 DIAGNOSIS — O09213 Supervision of pregnancy with history of pre-term labor, third trimester: Secondary | ICD-10-CM

## 2018-08-05 DIAGNOSIS — O09899 Supervision of other high risk pregnancies, unspecified trimester: Secondary | ICD-10-CM

## 2018-08-05 DIAGNOSIS — O0993 Supervision of high risk pregnancy, unspecified, third trimester: Secondary | ICD-10-CM

## 2018-08-05 DIAGNOSIS — O09819 Supervision of pregnancy resulting from assisted reproductive technology, unspecified trimester: Secondary | ICD-10-CM

## 2018-08-05 DIAGNOSIS — O09813 Supervision of pregnancy resulting from assisted reproductive technology, third trimester: Secondary | ICD-10-CM

## 2018-08-05 DIAGNOSIS — O0990 Supervision of high risk pregnancy, unspecified, unspecified trimester: Secondary | ICD-10-CM

## 2018-08-05 DIAGNOSIS — O09513 Supervision of elderly primigravida, third trimester: Secondary | ICD-10-CM

## 2018-08-05 LAB — POCT URINALYSIS DIPSTICK OB

## 2018-08-05 MED ORDER — HYDROXYPROGESTERONE CAPROATE 250 MG/ML IM OIL
250.0000 mg | TOPICAL_OIL | Freq: Once | INTRAMUSCULAR | Status: AC
  Administered 2018-08-05: 250 mg via INTRAMUSCULAR

## 2018-08-05 NOTE — Progress Notes (Signed)
Routine Prenatal Care Visit  Subjective  Laura Espinoza is a 42 y.o. [phone] at [redacted]w[redacted]d being seen today for ongoing prenatal care.  She is currently monitored for the following issues for this high-risk pregnancy and has Allergic rhinitis; Anxiety; Gastritis; Right knee pain; Thoracic back pain; Preventative health care; HBV (hepatitis B virus) infection; Perforated tympanic membrane; Need for hepatitis A vaccination; Asthma; Vaccine for streptococcus pneumoniae and influenza; Visit for screening mammogram; Breast mass, right; Supervision of high risk pregnancy, antepartum; Supervision of pregnancy resulting from assisted reproductive technology; Advanced maternal age, primigravida; Twin gestation in first trimester; Obesity affecting pregnancy; BMI 34.0-34.9,adult; History of preterm delivery, currently pregnant; and Nausea and vomiting during pregnancy on their problem list.  ----------------------------------------------------------------------------------- Patient reports no complaints.   Contractions: Not present. Vag. Bleeding: None.  Movement: Present. Denies leaking of fluid.  ----------------------------------------------------------------------------------- The following portions of the patient's history were reviewed and updated as appropriate: allergies, current medications, past family history, past medical history, past social history, past surgical history and problem list. Problem list updated.   Objective  Blood pressure 140/80, weight 186 lb (84.4 kg), last menstrual period 01/12/2018. Pregravid weight 181 lb (82.1 kg) Total Weight Gain 5 lb (2.268 kg) Urinalysis:      Fetal Status: Fetal Heart Rate (bpm): 137 Fundal Height: 29 cm Movement: Present     General:  Alert, oriented and cooperative. Patient is in no acute distress.  Skin: Skin is warm and dry. No rash noted.   Cardiovascular: Normal heart rate noted  Respiratory: Normal respiratory effort, no problems with  respiration noted  Abdomen: Soft, gravid, appropriate for gestational age. Pain/Pressure: Absent     Pelvic:  Cervical exam deferred        Extremities: Normal range of motion.  Edema: Trace  ental Status: Normal mood and affect. Normal behavior. Normal judgment and thought content.     Assessment   42 y.o. [id number] at [redacted]w[redacted]d by  10/26/2018, by Est. Date of Conception presenting for routine prenatal visit  Plan   Pregnancy Problems (from 03/19/18 to present)    Problem Noted Resolved   Supervision of high risk pregnancy, antepartum 03/19/2018 by Will Bonnet, MD No   Overview Addendum 05/27/2018  4:31 PM by Homero Fellers, MD    Clinic [facility] Prenatal Labs  Dating U/s confirmed ART, embryo transfer Blood type: B/Positive/-- (04/22 1036)   Genetic Screen First trimester screen: negative Antibody:Negative (04/22 1036)  Anatomic [place] complete Rubella: 29.60 (04/22 1036) Varicella: Immune  GTT Early: 57             Third trimester:  RPR: Non Reactive (04/22 1036)   Rhogam Not applicable HBsAg: Confirm. indicated (04/22 1036)   TDaP vaccine                        Flu Shot: HIV: Non Reactive (04/22 1036)   Baby Food   Breast                             GBS:   Contraception  Given information Pap: 2018 NIL, HPV negative  CBB     CS/VBAC    Support Person           Supervision of pregnancy resulting from assisted reproductive technology 03/19/2018 by Will Bonnet, MD No   Overview Addendum 05/27/2018  4:28 PM by Homero Fellers, MD    Two  blastocyst transfer on 2/28 (day of conception of 02/02/18). Twin gestational verified by ultrasound 3/29 ([redacted]w[redacted]d GA) [x ] fetal ECHO       Advanced maternal age, primigravida 03/19/2018 by Will Bonnet, MD No   Twin gestation in first trimester 03/19/2018 by Will Bonnet, MD No   Overview Addendum 03/19/2018  1:08 PM by Will Bonnet, MD    - Verify di-di twin gestation. Patient states she has ultrasound report.  -  likely di-di due to two embryo transfers to obtain pregnancy  - no Pre-implantation Genetic Screening done prior to pregnancy. - embryos collected in 2014 and frozen used for this pregnancy. [x ] start bASA after 12 weeks  Start low dose ASA at >[redacted] weeks gestation as per USPTF recommendation "Low-Dose Aspirin Use for the Prevention of Morbidity and Mortality From Preeclampsia: Preventive Medicine"  furthermore endorsed by ACOG, WHO, and NIH based on evidence level B for the prevention of preeclampsia  In women deemed high risk  (diabetes, renal disease, chronic hypertension, history of preeclampsia in prior gestation, autoimmune diseases, or multifetal gestations).  ACOG Committee Opinion 237 "Low-Dose Asprin Use During Pregnancy" June 25th 2018  High Risk (Start if 1 or more present) History of preeclampsia Multifetal Gestation Chronic HTN Type I or II DM Renal Disease Autoimmune Disease (SLE, Antiphospholipid antibody syndrome)  Moderate Risk (consider starting if more than one present) Nulliparity Obesity (BMI >30) Family history of Preeclampsia (Mother or sister) Socioeconomic characteristics (African American, low socieeconomic status) Age 42 years or older Personal history factors (low birthweight of SGA, previous adverse pregnancy outcome, more than 10 year pregnancy interval)      Obesity affecting pregnancy 03/19/2018 by Will Bonnet, MD No   Overview Signed 03/19/2018  1:03 PM by Will Bonnet, MD    [ ]  early 1h gtt [x]  Offer genetic screening      BMI 34.0-34.9,adult 03/19/2018 by Will Bonnet, MD No   History of preterm delivery, currently pregnant 03/19/2018 by Will Bonnet, MD No   Overview Signed 03/19/2018  1:02 PM by Will Bonnet, MD    - 17OHP not indicated for multiple gestations per ACOG PB130      Nausea and vomiting during pregnancy 03/19/2018 by Will Bonnet, MD No       Gestational age appropriate obstetric precautions  including but not limited to vaginal bleeding, contractions, leaking of fluid and fetal movement were reviewed in detail with the patient.    Glucose elevated in urine. Patient's finger stick was 98. She reports she did eat 4 candies at work before her visit today.  Discussed wearing wrist braces at night for carpel tunnel. She had this in her last pregnancy and is familiar with treatment.  17-OHP given today  Return in about 2 weeks (around 08/19/2018) for ROB.  Adrian Prows MD [facility] 08/05/18 4:59 PM

## 2018-08-05 NOTE — Progress Notes (Signed)
[facility]  C/o some carpal tunnel symptoms/ swelling in extremities

## 2018-08-19 ENCOUNTER — Ambulatory Visit (INDEPENDENT_AMBULATORY_CARE_PROVIDER_SITE_OTHER): Payer: BLUE CROSS/BLUE SHIELD | Admitting: Obstetrics & Gynecology

## 2018-08-19 VITALS — BP 128/70 | Wt 186.0 lb

## 2018-08-19 DIAGNOSIS — Z23 Encounter for immunization: Secondary | ICD-10-CM

## 2018-08-19 DIAGNOSIS — O09899 Supervision of other high risk pregnancies, unspecified trimester: Secondary | ICD-10-CM

## 2018-08-19 DIAGNOSIS — O09523 Supervision of elderly multigravida, third trimester: Secondary | ICD-10-CM

## 2018-08-19 DIAGNOSIS — O09213 Supervision of pregnancy with history of pre-term labor, third trimester: Secondary | ICD-10-CM

## 2018-08-19 DIAGNOSIS — O0990 Supervision of high risk pregnancy, unspecified, unspecified trimester: Secondary | ICD-10-CM

## 2018-08-19 DIAGNOSIS — O99213 Obesity complicating pregnancy, third trimester: Secondary | ICD-10-CM

## 2018-08-19 DIAGNOSIS — O09219 Supervision of pregnancy with history of pre-term labor, unspecified trimester: Secondary | ICD-10-CM

## 2018-08-19 DIAGNOSIS — Z3A30 30 weeks gestation of pregnancy: Secondary | ICD-10-CM | POA: Diagnosis not present

## 2018-08-19 MED ORDER — HYDROXYPROGESTERONE CAPROATE 250 MG/ML IM OIL
250.0000 mg | TOPICAL_OIL | Freq: Once | INTRAMUSCULAR | Status: AC
  Administered 2018-08-19: 250 mg via INTRAMUSCULAR

## 2018-08-19 NOTE — Patient Instructions (Signed)
Third Trimester of Pregnancy The third trimester is from week 28 through week 40 (months 7 through 9). The third trimester is a time when the unborn baby (fetus) is growing rapidly. At the end of the ninth month, the fetus is about 20 inches in length and weighs 6-10 pounds. Body changes during your third trimester Your body will continue to go through many changes during pregnancy. The changes vary from woman to woman. During the third trimester:  Your weight will continue to increase. You can expect to gain 25-35 pounds (11-16 kg) by the end of the pregnancy.  You may begin to get stretch marks on your hips, abdomen, and breasts.  You may urinate more often because the fetus is moving lower into your pelvis and pressing on your bladder.  You may develop or continue to have heartburn. This is caused by increased hormones that slow down muscles in the digestive tract.  You may develop or continue to have constipation because increased hormones slow digestion and cause the muscles that push waste through your intestines to relax.  You may develop hemorrhoids. These are swollen veins (varicose veins) in the rectum that can itch or be painful.  You may develop swollen, bulging veins (varicose veins) in your legs.  You may have increased body aches in the pelvis, back, or thighs. This is due to weight gain and increased hormones that are relaxing your joints.  You may have changes in your hair. These can include thickening of your hair, rapid growth, and changes in texture. Some women also have hair loss during or after pregnancy, or hair that feels dry or thin. Your hair will most likely return to normal after your baby is born.  Your breasts will continue to grow and they will continue to become tender. A yellow fluid (colostrum) may leak from your breasts. This is the first milk you are producing for your baby.  Your belly button may stick out.  You may notice more swelling in your hands,  face, or ankles.  You may have increased tingling or numbness in your hands, arms, and legs. The skin on your belly may also feel numb.  You may feel short of breath because of your expanding uterus.  You may have more problems sleeping. This can be caused by the size of your belly, increased need to urinate, and an increase in your body's metabolism.  You may notice the fetus "dropping," or moving lower in your abdomen (lightening).  You may have increased vaginal discharge.  You may notice your joints feel loose and you may have pain around your pelvic bone.  What to expect at prenatal visits You will have prenatal exams every 2 weeks until week 36. Then you will have weekly prenatal exams. During a routine prenatal visit:  You will be weighed to make sure you and the baby are growing normally.  Your blood pressure will be taken.  Your abdomen will be measured to track your baby's growth.  The fetal heartbeat will be listened to.  Any test results from the previous visit will be discussed.  You may have a cervical check near your due date to see if your cervix has softened or thinned (effaced).  You will be tested for Group B streptococcus. This happens between 35 and 37 weeks.  Your health care provider may ask you:  What your birth plan is.  How you are feeling.  If you are feeling the baby move.  If you have had   any abnormal symptoms, such as leaking fluid, bleeding, severe headaches, or abdominal cramping.  If you are using any tobacco products, including cigarettes, chewing tobacco, and electronic cigarettes.  If you have any questions.  Other tests or screenings that may be performed during your third trimester include:  Blood tests that check for low iron levels (anemia).  Fetal testing to check the health, activity level, and growth of the fetus. Testing is done if you have certain medical conditions or if there are problems during the  pregnancy.  Nonstress test (NST). This test checks the health of your baby to make sure there are no signs of problems, such as the baby not getting enough oxygen. During this test, a belt is placed around your belly. The baby is made to move, and its heart rate is monitored during movement.  What is false labor? False labor is a condition in which you feel small, irregular tightenings of the muscles in the womb (contractions) that usually go away with rest, changing position, or drinking water. These are called Braxton Hicks contractions. Contractions may last for hours, days, or even weeks before true labor sets in. If contractions come at regular intervals, become more frequent, increase in intensity, or become painful, you should see your health care provider. What are the signs of labor?  Abdominal cramps.  Regular contractions that start at 10 minutes apart and become stronger and more frequent with time.  Contractions that start on the top of the uterus and spread down to the lower abdomen and back.  Increased pelvic pressure and dull back pain.  A watery or bloody mucus discharge that comes from the vagina.  Leaking of amniotic fluid. This is also known as your "water breaking." It could be a slow trickle or a gush. Let your health care provider know if it has a color or strange odor. If you have any of these signs, call your health care provider right away, even if it is before your due date. Follow these instructions at home: Medicines  Follow your health care provider's instructions regarding medicine use. Specific medicines may be either safe or unsafe to take during pregnancy.  Take a prenatal vitamin that contains at least 600 micrograms (mcg) of folic acid.  If you develop constipation, try taking a stool softener if your health care provider approves. Eating and drinking  Eat a balanced diet that includes fresh fruits and vegetables, whole grains, good sources of protein  such as meat, eggs, or tofu, and low-fat dairy. Your health care provider will help you determine the amount of weight gain that is right for you.  Avoid raw meat and uncooked cheese. These carry germs that can cause birth defects in the baby.  If you have low calcium intake from food, talk to your health care provider about whether you should take a daily calcium supplement.  Eat four or five small meals rather than three large meals a day.  Limit foods that are high in fat and processed sugars, such as fried and sweet foods.  To prevent constipation: ? Drink enough fluid to keep your urine clear or pale yellow. ? Eat foods that are high in fiber, such as fresh fruits and vegetables, whole grains, and beans. Activity  Exercise only as directed by your health care provider. Most women can continue their usual exercise routine during pregnancy. Try to exercise for 30 minutes at least 5 days a week. Stop exercising if you experience uterine contractions.  Avoid heavy   lifting.  Do not exercise in extreme heat or humidity, or at high altitudes.  Wear low-heel, comfortable shoes.  Practice good posture.  You may continue to have sex unless your health care provider tells you otherwise. Relieving pain and discomfort  Take frequent breaks and rest with your legs elevated if you have leg cramps or low back pain.  Take warm sitz baths to soothe any pain or discomfort caused by hemorrhoids. Use hemorrhoid cream if your health care provider approves.  Wear a good support bra to prevent discomfort from breast tenderness.  If you develop varicose veins: ? Wear support pantyhose or compression stockings as told by your healthcare provider. ? Elevate your feet for 15 minutes, 3-4 times a day. Prenatal care  Write down your questions. Take them to your prenatal visits.  Keep all your prenatal visits as told by your health care provider. This is important. Safety  Wear your seat belt at  all times when driving.  Make a list of emergency phone numbers, including numbers for family, friends, the hospital, and police and fire departments. General instructions  Avoid cat litter boxes and soil used by cats. These carry germs that can cause birth defects in the baby. If you have a cat, ask someone to clean the litter box for you.  Do not travel far distances unless it is absolutely necessary and only with the approval of your health care provider.  Do not use hot tubs, steam rooms, or saunas.  Do not drink alcohol.  Do not use any products that contain nicotine or tobacco, such as cigarettes and e-cigarettes. If you need help quitting, ask your health care provider.  Do not use any medicinal herbs or unprescribed drugs. These chemicals affect the formation and growth of the baby.  Do not douche or use tampons or scented sanitary pads.  Do not cross your legs for long periods of time.  To prepare for the arrival of your baby: ? Take prenatal classes to understand, practice, and ask questions about labor and delivery. ? Make a trial run to the hospital. ? Visit the hospital and tour the maternity area. ? Arrange for maternity or paternity leave through employers. ? Arrange for family and friends to take care of pets while you are in the hospital. ? Purchase a rear-facing car seat and make sure you know how to install it in your car. ? Pack your hospital bag. ? Prepare the baby's nursery. Make sure to remove all pillows and stuffed animals from the baby's crib to prevent suffocation.  Visit your dentist if you have not gone during your pregnancy. Use a soft toothbrush to brush your teeth and be gentle when you floss. Contact a health care provider if:  You are unsure if you are in labor or if your water has broken.  You become dizzy.  You have mild pelvic cramps, pelvic pressure, or nagging pain in your abdominal area.  You have lower back pain.  You have persistent  nausea, vomiting, or diarrhea.  You have an unusual or bad smelling vaginal discharge.  You have pain when you urinate. Get help right away if:  Your water breaks before 37 weeks.  You have regular contractions less than 5 minutes apart before 37 weeks.  You have a fever.  You are leaking fluid from your vagina.  You have spotting or bleeding from your vagina.  You have severe abdominal pain or cramping.  You have rapid weight loss or weight gain.    You have shortness of breath with chest pain.  You notice sudden or extreme swelling of your face, hands, ankles, feet, or legs.  Your baby makes fewer than 10 movements in 2 hours.  You have severe headaches that do not go away when you take medicine.  You have vision changes. Summary  The third trimester is from week 28 through week 40, months 7 through 9. The third trimester is a time when the unborn baby (fetus) is growing rapidly.  During the third trimester, your discomfort may increase as you and your baby continue to gain weight. You may have abdominal, leg, and back pain, sleeping problems, and an increased need to urinate.  During the third trimester your breasts will keep growing and they will continue to become tender. A yellow fluid (colostrum) may leak from your breasts. This is the first milk you are producing for your baby.  False labor is a condition in which you feel small, irregular tightenings of the muscles in the womb (contractions) that eventually go away. These are called Braxton Hicks contractions. Contractions may last for hours, days, or even weeks before true labor sets in.  Signs of labor can include: abdominal cramps; regular contractions that start at 10 minutes apart and become stronger and more frequent with time; watery or bloody mucus discharge that comes from the vagina; increased pelvic pressure and dull back pain; and leaking of amniotic fluid. This information is not intended to replace advice  given to you by your health care provider. Make sure you discuss any questions you have with your health care provider. Document Released: 11/21/2001 Document Revised: 05/04/2016 Document Reviewed: 01/28/2013 Elsevier Interactive Patient Education  2017 Elsevier Inc.  

## 2018-08-19 NOTE — Addendum Note (Signed)
Addended by: Quintella Baton D on: 08/19/2018 08:57 AM   Modules accepted: Orders

## 2018-08-19 NOTE — Progress Notes (Signed)
  Subjective  Fetal Movement? yes Contractions? no Leaking Fluid? no Vaginal Bleeding? no  Objective  BP 128/70   Wt 186 lb (84.4 kg)   LMP 01/12/2018   BMI 35.14 kg/m  General: NAD Pumonary: no increased work of breathing Abdomen: gravid, non-tender Extremities: no edema Psychiatric: mood appropriate, affect full  Assessment  42 y.o. [id number] at [redacted]w[redacted]d by  10/26/2018, by Est. Date of Conception presenting for routine prenatal visit  Plan   Problem List Items Addressed This Visit      Other   Supervision of high risk pregnancy, antepartum   Obesity affecting pregnancy   History of preterm delivery, currently pregnant   AMA (advanced maternal age) multigravida 35+, third trimester    Other Visit Diagnoses    [redacted] weeks gestation of pregnancy    -  Primary    Makena weekly, shots at home on off week    Monitor for s/sx PTL TDaP todat, Flu Shot nv [facility], [facility]  Barnett Applebaum, MD, [name], [facility] 08/19/2018  8:49 AM

## 2018-09-02 ENCOUNTER — Ambulatory Visit (INDEPENDENT_AMBULATORY_CARE_PROVIDER_SITE_OTHER): Payer: BLUE CROSS/BLUE SHIELD | Admitting: Obstetrics and Gynecology

## 2018-09-02 ENCOUNTER — Encounter: Payer: Self-pay | Admitting: Obstetrics and Gynecology

## 2018-09-02 VITALS — BP 120/70 | Wt 187.0 lb

## 2018-09-02 DIAGNOSIS — Z6835 Body mass index (BMI) 35.0-35.9, adult: Secondary | ICD-10-CM

## 2018-09-02 DIAGNOSIS — O98413 Viral hepatitis complicating pregnancy, third trimester: Secondary | ICD-10-CM

## 2018-09-02 DIAGNOSIS — Z3A32 32 weeks gestation of pregnancy: Secondary | ICD-10-CM | POA: Diagnosis not present

## 2018-09-02 DIAGNOSIS — O0990 Supervision of high risk pregnancy, unspecified, unspecified trimester: Secondary | ICD-10-CM

## 2018-09-02 DIAGNOSIS — Z23 Encounter for immunization: Secondary | ICD-10-CM

## 2018-09-02 DIAGNOSIS — O26843 Uterine size-date discrepancy, third trimester: Secondary | ICD-10-CM

## 2018-09-02 DIAGNOSIS — G5603 Carpal tunnel syndrome, bilateral upper limbs: Secondary | ICD-10-CM

## 2018-09-02 DIAGNOSIS — B181 Chronic viral hepatitis B without delta-agent: Secondary | ICD-10-CM

## 2018-09-02 DIAGNOSIS — O09819 Supervision of pregnancy resulting from assisted reproductive technology, unspecified trimester: Secondary | ICD-10-CM

## 2018-09-02 DIAGNOSIS — O09523 Supervision of elderly multigravida, third trimester: Secondary | ICD-10-CM

## 2018-09-02 DIAGNOSIS — M25511 Pain in right shoulder: Secondary | ICD-10-CM

## 2018-09-02 DIAGNOSIS — O09813 Supervision of pregnancy resulting from assisted reproductive technology, third trimester: Secondary | ICD-10-CM

## 2018-09-02 DIAGNOSIS — O09213 Supervision of pregnancy with history of pre-term labor, third trimester: Secondary | ICD-10-CM | POA: Diagnosis not present

## 2018-09-02 DIAGNOSIS — O99213 Obesity complicating pregnancy, third trimester: Secondary | ICD-10-CM

## 2018-09-02 DIAGNOSIS — O09219 Supervision of pregnancy with history of pre-term labor, unspecified trimester: Secondary | ICD-10-CM

## 2018-09-02 DIAGNOSIS — O09899 Supervision of other high risk pregnancies, unspecified trimester: Secondary | ICD-10-CM

## 2018-09-02 LAB — POCT URINALYSIS DIPSTICK OB

## 2018-09-02 MED ORDER — HYDROXYPROGESTERONE CAPROATE 250 MG/ML IM OIL
250.0000 mg | TOPICAL_OIL | Freq: Once | INTRAMUSCULAR | Status: AC
  Administered 2018-09-02: 250 mg via INTRAMUSCULAR

## 2018-09-02 NOTE — Progress Notes (Signed)
Routine Prenatal Care Visit  Subjective  Laura Espinoza is a 42 y.o. [phone] at [redacted]w[redacted]d being seen today for ongoing prenatal care.  She is currently monitored for the following issues for this high-risk pregnancy and has Allergic rhinitis; Anxiety; Gastritis; Right knee pain; Thoracic back pain; Preventative health care; HBV (hepatitis B virus) infection; Perforated tympanic membrane; Need for hepatitis A vaccination; Asthma; Vaccine for streptococcus pneumoniae and influenza; Visit for screening mammogram; Breast mass, right; Supervision of high risk pregnancy, antepartum; Supervision of pregnancy resulting from assisted reproductive technology; Twin gestation in first trimester; Obesity affecting pregnancy; BMI 35.0-35.9,adult; History of preterm delivery, currently pregnant; Nausea and vomiting during pregnancy; and AMA (advanced maternal age) multigravida 35+, third trimester on their problem list.  ----------------------------------------------------------------------------------- Patient reports no complaints.   Contractions: Not present. Vag. Bleeding: None.  Movement: Present. Denies leaking of fluid.  ----------------------------------------------------------------------------------- The following portions of the patient's history were reviewed and updated as appropriate: allergies, current medications, past family history, past medical history, past social history, past surgical history and problem list. Problem list updated.   Objective  Blood pressure 120/70, weight 187 lb (84.8 kg), last menstrual period 01/12/2018. Pregravid weight 181 lb (82.1 kg) Total Weight Gain 6 lb (2.722 kg) Urinalysis:      Fetal Status: Fetal Heart Rate (bpm): 144 Fundal Height: 35 cm Movement: Present     General:  Alert, oriented and cooperative. Patient is in no acute distress.  Skin: Skin is warm and dry. No rash noted.   Cardiovascular: Normal heart rate noted  Respiratory: Normal respiratory  effort, no problems with respiration noted  Abdomen: Soft, gravid, appropriate for gestational age. Pain/Pressure: Present     Pelvic:  Cervical exam deferred        Extremities: Normal range of motion.     ental Status: Normal mood and affect. Normal behavior. Normal judgment and thought content.     Assessment   42 y.o. [id number] at [redacted]w[redacted]d by  10/26/2018, by Est. Date of Conception presenting for routine prenatal visit  Plan   Pregnancy Problems (from 03/19/18 to present)    Problem Noted Resolved   Supervision of high risk pregnancy, antepartum 03/19/2018 by Will Bonnet, MD No   Overview Addendum 09/02/2018  8:37 AM by Homero Fellers, [place] Prenatal Labs  Dating U/s confirmed ART, embryo transfer Blood type: B/Positive/-- (04/22 1036)   Genetic Screen First trimester screen: negative Antibody:Negative (04/22 1036)  Anatomic [place] complete Rubella: 29.60 (04/22 1036) Varicella: Immune  GTT Early: 54             Third trimester: 127 RPR: Non Reactive (04/22 1036)   Rhogam Not applicable HBsAg: Confirm. indicated (04/22 1036)   TDaP vaccine   08/19/2018                     Flu Shot: 09/02/18 HIV: Non Reactive (04/22 1036)   Baby Food   Breast                             GBS:   Contraception  considering nexplanon Pap: 2018 NIL, HPV negative  CBB     CS/VBAC  not applicable   Support Person           Supervision of pregnancy resulting from assisted reproductive technology 03/19/2018 by Will Bonnet, MD No   Overview Addendum 05/27/2018  4:28 PM by Gilman Schmidt,  Dayle Mcnerney R, MD    Two blastocyst transfer on 2/28 (day of conception of 02/02/18). Twin gestational verified by ultrasound 3/29 ([redacted]w[redacted]d GA) [ ]  fetal ECHO       Twin gestation in first trimester 03/19/2018 by Will Bonnet, MD No   Overview Addendum 03/19/2018  1:08 PM by Will Bonnet, MD    - Verify di-di twin gestation. Patient states she has ultrasound report.  - likely di-di due to two  embryo transfers to obtain pregnancy  - no Pre-implantation Genetic Screening done prior to pregnancy. - embryos collected in 2014 and frozen used for this pregnancy. [ ]  start bASA after 12 weeks  Start low dose ASA at >[redacted] weeks gestation as per USPTF recommendation "Low-Dose Aspirin Use for the Prevention of Morbidity and Mortality From Preeclampsia: Preventive Medicine"  furthermore endorsed by ACOG, WHO, and NIH based on evidence level B for the prevention of preeclampsia  In women deemed high risk  (diabetes, renal disease, chronic hypertension, history of preeclampsia in prior gestation, autoimmune diseases, or multifetal gestations).  ACOG Committee Opinion 665 "Low-Dose Asprin Use During Pregnancy" June 25th 2018  High Risk (Start if 1 or more present) History of preeclampsia Multifetal Gestation Chronic HTN Type I or II DM Renal Disease Autoimmune Disease (SLE, Antiphospholipid antibody syndrome)  Moderate Risk (consider starting if more than one present) Nulliparity Obesity (BMI >30) Family history of Preeclampsia (Mother or sister) Socioeconomic characteristics (African American, low socieeconomic status) Age 59 years or older Personal history factors (low birthweight of SGA, previous adverse pregnancy outcome, more than 10 year pregnancy interval)      Obesity affecting pregnancy 03/19/2018 by Will Bonnet, MD No   Overview Signed 03/19/2018  1:03 PM by Will Bonnet, MD    [ ]  early 1h gtt [x]  Offer genetic screening      BMI 35.0-35.9,adult 03/19/2018 by Will Bonnet, MD No   History of preterm delivery, currently pregnant 03/19/2018 by Will Bonnet, MD No   Overview Signed 03/19/2018  1:02 PM by Will Bonnet, MD    - 17OHP not indicated for multiple gestations per ACOG PB130      Nausea and vomiting during pregnancy 03/19/2018 by Will Bonnet, MD No       Gestational age appropriate obstetric precautions including but not limited to  vaginal bleeding, contractions, leaking of fluid and fetal movement were reviewed in detail with the patient.    Given information on volunteer doula program at the hospital.  Discussed breast feeding. Patient is planning on breast feeding.  Will refer to orthopedics, feeling nerve impingement in shoulder, neck and symptoms of carpal tunnel bilaterally.    Return in about 2 weeks (around 09/16/2018) for Power and [place].  Adrian Prows MD [facility], [facility] 09/02/18 8:40 AM

## 2018-09-16 ENCOUNTER — Encounter: Payer: BLUE CROSS/BLUE SHIELD | Admitting: Obstetrics & Gynecology

## 2018-09-16 ENCOUNTER — Ambulatory Visit (INDEPENDENT_AMBULATORY_CARE_PROVIDER_SITE_OTHER): Payer: BLUE CROSS/BLUE SHIELD | Admitting: Obstetrics & Gynecology

## 2018-09-16 ENCOUNTER — Ambulatory Visit (INDEPENDENT_AMBULATORY_CARE_PROVIDER_SITE_OTHER): Payer: BLUE CROSS/BLUE SHIELD

## 2018-09-16 VITALS — BP 130/80 | Wt 188.0 lb

## 2018-09-16 DIAGNOSIS — O26843 Uterine size-date discrepancy, third trimester: Secondary | ICD-10-CM | POA: Diagnosis not present

## 2018-09-16 DIAGNOSIS — O09819 Supervision of pregnancy resulting from assisted reproductive technology, unspecified trimester: Secondary | ICD-10-CM

## 2018-09-16 DIAGNOSIS — Z3A34 34 weeks gestation of pregnancy: Secondary | ICD-10-CM

## 2018-09-16 DIAGNOSIS — O09899 Supervision of other high risk pregnancies, unspecified trimester: Secondary | ICD-10-CM

## 2018-09-16 DIAGNOSIS — O99213 Obesity complicating pregnancy, third trimester: Secondary | ICD-10-CM

## 2018-09-16 DIAGNOSIS — O0990 Supervision of high risk pregnancy, unspecified, unspecified trimester: Secondary | ICD-10-CM

## 2018-09-16 DIAGNOSIS — O09213 Supervision of pregnancy with history of pre-term labor, third trimester: Secondary | ICD-10-CM | POA: Diagnosis not present

## 2018-09-16 DIAGNOSIS — O09219 Supervision of pregnancy with history of pre-term labor, unspecified trimester: Secondary | ICD-10-CM

## 2018-09-16 MED ORDER — HYDROXYPROGESTERONE CAPROATE 250 MG/ML IM OIL
250.0000 mg | TOPICAL_OIL | Freq: Once | INTRAMUSCULAR | Status: AC
  Administered 2018-09-16: 250 mg via INTRAMUSCULAR

## 2018-09-16 NOTE — Patient Instructions (Signed)
Braxton Hicks Contractions °Contractions of the uterus can occur throughout pregnancy, but they are not always a sign that you are in labor. You may have practice contractions called Braxton Hicks contractions. These false labor contractions are sometimes confused with true labor. °What are Braxton Hicks contractions? °Braxton Hicks contractions are tightening movements that occur in the muscles of the uterus before labor. Unlike true labor contractions, these contractions do not result in opening (dilation) and thinning of the cervix. Toward the end of pregnancy (32-34 weeks), Braxton Hicks contractions can happen more often and may become stronger. These contractions are sometimes difficult to tell apart from true labor because they can be very uncomfortable. You should not feel embarrassed if you go to the hospital with false labor. °Sometimes, the only way to tell if you are in true labor is for your health care provider to look for changes in the cervix. The health care provider will do a physical exam and may monitor your contractions. If you are not in true labor, the exam should show that your cervix is not dilating and your water has not broken. °If there are other health problems associated with your pregnancy, it is completely safe for you to be sent home with false labor. You may continue to have Braxton Hicks contractions until you go into true labor. °How to tell the difference between true labor and false labor °True labor °· Contractions last 30-70 seconds. °· Contractions become very regular. °· Discomfort is usually felt in the top of the uterus, and it spreads to the lower abdomen and low back. °· Contractions do not go away with walking. °· Contractions usually become more intense and increase in frequency. °· The cervix dilates and gets thinner. °False labor °· Contractions are usually shorter and not as strong as true labor contractions. °· Contractions are usually irregular. °· Contractions  are often felt in the front of the lower abdomen and in the groin. °· Contractions may go away when you walk around or change positions while lying down. °· Contractions get weaker and are shorter-lasting as time goes on. °· The cervix usually does not dilate or become thin. °Follow these instructions at home: °· Take over-the-counter and prescription medicines only as told by your health care provider. °· Keep up with your usual exercises and follow other instructions from your health care provider. °· Eat and drink lightly if you think you are going into labor. °· If Braxton Hicks contractions are making you uncomfortable: °? Change your position from lying down or resting to walking, or change from walking to resting. °? Sit and rest in a tub of warm water. °? Drink enough fluid to keep your urine pale yellow. Dehydration may cause these contractions. °? Do slow and deep breathing several times an hour. °· Keep all follow-up prenatal visits as told by your health care provider. This is important. °Contact a health care provider if: °· You have a fever. °· You have continuous pain in your abdomen. °Get help right away if: °· Your contractions become stronger, more regular, and closer together. °· You have fluid leaking or gushing from your vagina. °· You pass blood-tinged mucus (bloody show). °· You have bleeding from your vagina. °· You have low back pain that you never had before. °· You feel your baby’s head pushing down and causing pelvic pressure. °· Your baby is not moving inside you as much as it used to. °Summary °· Contractions that occur before labor are called Braxton   Hicks contractions, false labor, or practice contractions. °· Braxton Hicks contractions are usually shorter, weaker, farther apart, and less regular than true labor contractions. True labor contractions usually become progressively stronger and regular and they become more frequent. °· Manage discomfort from Braxton Hicks contractions by  changing position, resting in a warm bath, drinking plenty of water, or practicing deep breathing. °This information is not intended to replace advice given to you by your health care provider. Make sure you discuss any questions you have with your health care provider. °Document Released: 04/12/2017 Document Revised: 04/12/2017 Document Reviewed: 04/12/2017 °Elsevier Interactive Patient Education © 2018 [employer] Inc. ° °

## 2018-09-16 NOTE — Progress Notes (Signed)
  Subjective  Fetal Movement? yes Contractions? no Leaking Fluid? no Vaginal Bleeding? no Makena bi weekly at home, no concerns Objective  BP 130/80   Wt 188 lb (85.3 kg)   LMP 01/12/2018   BMI 35.52 kg/m  General: NAD Pumonary: no increased work of breathing Abdomen: gravid, non-tender Extremities: no edema Psychiatric: mood appropriate, affect full  Assessment  42 y.o. [id number] at [redacted]w[redacted]d by  10/26/2018, by Est. Date of Conception presenting for routine prenatal visit  Plan   Problem List Items Addressed This Visit      Other   Supervision of high risk pregnancy, antepartum   Obesity affecting pregnancy   History of preterm delivery, currently pregnant    Other Visit Diagnoses    [redacted] weeks gestation of pregnancy    -  Primary    Makena here today, weekly til 36 weeks PTL precautions discussed PNV, [place] Breast feeding plans Monitor edema  Laura Applebaum, MD, [name], [facility] 09/16/2018  10:01 AM

## 2018-09-17 ENCOUNTER — Encounter (INDEPENDENT_AMBULATORY_CARE_PROVIDER_SITE_OTHER): Payer: Self-pay | Admitting: Orthopaedic Surgery

## 2018-09-17 ENCOUNTER — Ambulatory Visit (INDEPENDENT_AMBULATORY_CARE_PROVIDER_SITE_OTHER): Payer: BLUE CROSS/BLUE SHIELD | Admitting: Orthopaedic Surgery

## 2018-09-17 ENCOUNTER — Other Ambulatory Visit (INDEPENDENT_AMBULATORY_CARE_PROVIDER_SITE_OTHER): Payer: Self-pay | Admitting: Radiology

## 2018-09-17 VITALS — BP 121/83 | HR 107 | Ht 61.0 in | Wt 188.0 lb

## 2018-09-17 DIAGNOSIS — M25532 Pain in left wrist: Secondary | ICD-10-CM

## 2018-09-17 DIAGNOSIS — G5601 Carpal tunnel syndrome, right upper limb: Secondary | ICD-10-CM | POA: Diagnosis not present

## 2018-09-17 DIAGNOSIS — G5602 Carpal tunnel syndrome, left upper limb: Secondary | ICD-10-CM

## 2018-09-17 DIAGNOSIS — M25531 Pain in right wrist: Secondary | ICD-10-CM

## 2018-09-17 NOTE — Progress Notes (Signed)
Office Visit Note   Patient: Laura Espinoza           Date of Birth: 01-Aug-1976           MRN: [id number] Visit Date: 09/17/2018              Requested by: Homero Fellers, [address] [place], [place] [address] PCP: Arnetha Courser, MD   Assessment & Plan: Visit Diagnoses:  1. Carpal tunnel syndrome, left upper limb   2. Carpal tunnel syndrome, right upper limb     Plan: Bilateral carpal tunnel syndrome related to her advanced pregnancy.  Having some shoulder hand symptoms particularly in the right upper extremity.  We will try a course of physical therapy.  Already is taking Tylenol and wearing a volar wrist splints.  Baby is due next month which will help with her symptoms to delivery  Follow-Up Instructions: Return if symptoms worsen or fail to improve.   Orders:  No orders of the defined types were placed in this encounter.  No orders of the defined types were placed in this encounter.     Procedures: No procedures performed   Clinical Data: No additional findings.   Subjective: Chief Complaint  Patient presents with  . New Patient (Initial Visit)    DX WITH BI LAT CARPAL TUNNEL R SIDE IS WORSE JUST GETTING WORSE, R SHOULDER PAIN REF FROM OBGYN  42 year old female is 8 months pregnant and visited the office for evaluation of bilateral wrist and hand pain.  She has been diagnosed with carpal tunnel syndrome.  She is been taking Tylenol wearing bilateral volar wrist splints.  Still having considerable trouble sleeping and doing repetitive activities around the house.  She has developed some discomfort in her right upper extremity as well.  Denies any neck pain.  HPI  Review of Systems  Constitutional: Negative for fatigue and fever.  HENT: Negative for ear pain.   Eyes: Negative for pain.  Respiratory: Negative for cough and shortness of breath.   Cardiovascular: Negative for leg swelling.  Gastrointestinal: Negative for constipation and diarrhea.   Genitourinary: Negative for difficulty urinating.  Musculoskeletal: Negative for back pain and neck pain.  Skin: Negative for rash.  Allergic/Immunologic: Negative for food allergies.  Neurological: Positive for weakness and numbness.  Hematological: Does not bruise/bleed easily.  Psychiatric/Behavioral: Positive for sleep disturbance.     Objective: Vital Signs: BP 121/83 (BP Location: Left Arm, Patient Position: Sitting, Cuff Size: Normal)   Pulse (!) 107   Ht 5\' 1"  (1.549 m)   Wt 188 lb (85.3 kg)   LMP 01/12/2018   BMI 35.52 kg/m   Physical Exam  Constitutional: She is oriented to person, place, and time. She appears well-developed and well-nourished.  HENT:  Mouth/Throat: Oropharynx is clear and moist.  Eyes: Pupils are equal, round, and reactive to light. EOM are normal.  Pulmonary/Chest: Effort normal.  Neurological: She is alert and oriented to person, place, and time.  Skin: Skin is warm and dry.  Psychiatric: She has a normal mood and affect. Her behavior is normal.    Ortho Exam awake alert and oriented x3.  Comfortable sitting.  8 months pregnant but still comfortable sitting position.  Walks without a limp.  Wearing a volar wrist splint on the right.  Mild Tinel's over the median nerve.  Fingers not swollen.  Good opposition of thumb to little finger.  Neurologically intact.  Does have some mild tenderness over the ulnar nerve at  the left elbow but no Tinel's into the hand.  Able to place both arms fully overhead.  Negative impingement.  Skin intact  Specialty Comments:  No specialty comments available.  Imaging: No results found.   PMFS History: Patient Active Problem List   Diagnosis Date Noted  . Carpal tunnel syndrome, left upper limb 09/17/2018  . Carpal tunnel syndrome, right upper limb 09/17/2018  . AMA (advanced maternal age) multigravida 58+, third trimester 08/19/2018  . Supervision of high risk pregnancy, antepartum 03/19/2018  . Supervision of  pregnancy resulting from assisted reproductive technology 03/19/2018  . Twin gestation in first trimester 03/19/2018  . Obesity affecting pregnancy 03/19/2018  . BMI 35.0-35.9,adult 03/19/2018  . History of preterm delivery, currently pregnant 03/19/2018  . Nausea and vomiting during pregnancy 03/19/2018  . Breast mass, right 11/11/2017  . Vaccine for streptococcus pneumoniae and influenza 09/17/2017  . Visit for screening mammogram 09/17/2017  . Need for hepatitis A vaccination 09/15/2016  . Asthma 09/15/2016  . Perforated tympanic membrane 04/09/2016  . Right knee pain 11/17/2015  . Thoracic back pain 11/17/2015  . Preventative health care 11/17/2015  . Allergic rhinitis   . Anxiety   . Gastritis   . HBV (hepatitis B virus) infection 06/04/2014   Past Medical History:  Diagnosis Date  . Allergic rhinitis   . Anxiety   . Breast mass, right 11/11/2017   Nov 09, 2017, add'l views ordered  . Gastritis   . Hepatitis B   . OM (otitis media), recurrent    as a child; ruptured TM  . Pneumonia 2007   hospitalized for possible pneumonia in [place] for 2 weeks  . Recurrent sinusitis     Family History  Problem Relation Age of Onset  . Hypertension Mother   . Heart disease Mother   . CAD Mother   . Heart attack Mother   . Arthritis Mother   . CAD Father   . Heart disease Father   . Glaucoma Father   . Cancer Father        kidney  . Hypertension Brother   . Breast cancer Paternal Aunt        late 35's- early 53's  . Cancer Maternal Grandfather        unknown  . Stroke Paternal Grandmother   . Cancer Paternal Grandfather        lung?  Mar[place] Kitchen COPD Neg Hx   . Diabetes Neg Hx     Past Surgical History:  Procedure Laterality Date  . fallopian tube removal Left March 2014  . INNER EAR SURGERY    . LAPAROSCOPIC OVARIAN CYSTECTOMY  2008   in [place]  . REFRACTIVE SURGERY Bilateral 1998  . TYMPANOPLASTY WITH GRAFT Left 2017   [place], Dr. Ned Grace   Social History    Occupational History  . Not on file  Tobacco Use  . Smoking status: Never Smoker  . Smokeless tobacco: Never Used  Substance and Sexual Activity  . Alcohol use: Not Currently  . Drug use: No  . Sexual activity: Yes

## 2018-09-20 ENCOUNTER — Ambulatory Visit: Payer: BLUE CROSS/BLUE SHIELD | Admitting: Family Medicine

## 2018-09-24 ENCOUNTER — Telehealth: Payer: Self-pay | Admitting: Obstetrics & Gynecology

## 2018-09-26 ENCOUNTER — Other Ambulatory Visit: Payer: Self-pay | Admitting: Obstetrics & Gynecology

## 2018-09-26 MED ORDER — HYDROXYPROGESTERONE CAPROATE 250 MG/ML IM OIL: TOPICAL_OIL | INTRAMUSCULAR | 0 refills | Status: DC

## 2018-09-26 NOTE — Telephone Encounter (Signed)
done

## 2018-09-26 NOTE — Telephone Encounter (Signed)
Patient is calling to follow up on her refill request. Please advise. Patient aware [place] is at the [facility]

## 2018-09-30 ENCOUNTER — Ambulatory Visit (INDEPENDENT_AMBULATORY_CARE_PROVIDER_SITE_OTHER): Payer: BLUE CROSS/BLUE SHIELD | Admitting: Obstetrics and Gynecology

## 2018-09-30 ENCOUNTER — Ambulatory Visit: Payer: BLUE CROSS/BLUE SHIELD | Attending: Orthopaedic Surgery | Admitting: Occupational Therapy

## 2018-09-30 ENCOUNTER — Other Ambulatory Visit: Payer: Self-pay

## 2018-09-30 ENCOUNTER — Other Ambulatory Visit (HOSPITAL_COMMUNITY)
Admission: RE | Admit: 2018-09-30 | Discharge: 2018-09-30 | Disposition: A | Discharge status: Home / Self Care | Payer: BLUE CROSS/BLUE SHIELD | Source: Ambulatory Visit | Attending: Obstetrics and Gynecology | Admitting: Obstetrics and Gynecology

## 2018-09-30 ENCOUNTER — Encounter: Payer: Self-pay | Admitting: Obstetrics and Gynecology

## 2018-09-30 ENCOUNTER — Encounter: Payer: Self-pay | Admitting: Occupational Therapy

## 2018-09-30 VITALS — BP 140/90 | Wt 192.5 lb

## 2018-09-30 DIAGNOSIS — O26893 Other specified pregnancy related conditions, third trimester: Secondary | ICD-10-CM

## 2018-09-30 DIAGNOSIS — R0602 Shortness of breath: Secondary | ICD-10-CM

## 2018-09-30 DIAGNOSIS — Z3A36 36 weeks gestation of pregnancy: Secondary | ICD-10-CM | POA: Insufficient documentation

## 2018-09-30 DIAGNOSIS — O0990 Supervision of high risk pregnancy, unspecified, unspecified trimester: Secondary | ICD-10-CM | POA: Diagnosis not present

## 2018-09-30 DIAGNOSIS — G5603 Carpal tunnel syndrome, bilateral upper limbs: Secondary | ICD-10-CM | POA: Diagnosis not present

## 2018-09-30 DIAGNOSIS — M7711 Lateral epicondylitis, right elbow: Secondary | ICD-10-CM | POA: Diagnosis not present

## 2018-09-30 DIAGNOSIS — O09813 Supervision of pregnancy resulting from assisted reproductive technology, third trimester: Secondary | ICD-10-CM

## 2018-09-30 DIAGNOSIS — O09523 Supervision of elderly multigravida, third trimester: Secondary | ICD-10-CM

## 2018-09-30 DIAGNOSIS — R000 Tachycardia, unspecified: Secondary | ICD-10-CM

## 2018-09-30 DIAGNOSIS — O0993 Supervision of high risk pregnancy, unspecified, third trimester: Secondary | ICD-10-CM | POA: Diagnosis not present

## 2018-09-30 DIAGNOSIS — M25511 Pain in right shoulder: Secondary | ICD-10-CM | POA: Insufficient documentation

## 2018-09-30 DIAGNOSIS — O09819 Supervision of pregnancy resulting from assisted reproductive technology, unspecified trimester: Secondary | ICD-10-CM

## 2018-09-30 DIAGNOSIS — O9989 Other specified diseases and conditions complicating pregnancy, childbirth and the puerperium: Secondary | ICD-10-CM

## 2018-09-30 DIAGNOSIS — O09213 Supervision of pregnancy with history of pre-term labor, third trimester: Secondary | ICD-10-CM

## 2018-09-30 DIAGNOSIS — O09219 Supervision of pregnancy with history of pre-term labor, unspecified trimester: Secondary | ICD-10-CM

## 2018-09-30 DIAGNOSIS — O09899 Supervision of other high risk pregnancies, unspecified trimester: Secondary | ICD-10-CM

## 2018-09-30 LAB — FETAL NONSTRESS TEST

## 2018-09-30 MED ORDER — HYDROXYPROGESTERONE CAPROATE 250 MG/ML IM OIL: 250.0000 mg | TOPICAL_OIL | Freq: Once | INTRAMUSCULAR | Status: DC

## 2018-09-30 NOTE — Patient Instructions (Signed)
Warm shower - cervical stretches - lat flexion , scalens stretches  Rotation all bilateral - hold 3-5 sec  10 x  Scapula squeezes - 10 reps  5 xday   Elbow on the R  2-3 x day  Heat, cross friction massage Ice massage   Hands - contrast 3 x day  Tendon glides   10 reps  Med N glide -5 reps   Wear wrist splints and also fitted with isotoner gloves as needed or to tolerance

## 2018-09-30 NOTE — Progress Notes (Addendum)
Routine Prenatal Care Visit  Subjective  Laura Espinoza is a 42 y.o. [phone] at [redacted]w[redacted]d being seen today for ongoing prenatal care.  She is currently monitored for the following issues for this high-risk pregnancy and has Allergic rhinitis; Anxiety; Gastritis; Right knee pain; Thoracic back pain; Preventative health care; HBV (hepatitis B virus) infection; Perforated tympanic membrane; Need for hepatitis A vaccination; Asthma; Vaccine for streptococcus pneumoniae and influenza; Visit for screening mammogram; Breast mass, right; Supervision of high risk pregnancy, antepartum; Supervision of pregnancy resulting from assisted reproductive technology; Twin gestation in first trimester; Obesity affecting pregnancy; BMI 35.0-35.9,adult; History of preterm delivery, currently pregnant; Nausea and vomiting during pregnancy; AMA (advanced maternal age) multigravida 35+, third trimester; Carpal tunnel syndrome, left upper limb; and Carpal tunnel syndrome, right upper limb on their problem list.  ----------------------------------------------------------------------------------- Patient reports shortness of breath with ambulating short disctances. Has had coughing up of fluid/mucus at night. Can not walk up a flight of steps without being significantly winded. .   Contractions: Not present. Vag. Bleeding: None.  Movement: Present. Denies leaking of fluid.  ----------------------------------------------------------------------------------- The following portions of the patient's history were reviewed and updated as appropriate: allergies, current medications, past family history, past medical history, past social history, past surgical history and problem list. Problem list updated.   Objective  Blood pressure 140/90, weight 192 lb 8 oz (87.3 kg), last menstrual period 01/12/2018. Pregravid weight 181 lb (82.1 kg) Total Weight Gain 11 lb 8 oz (5.216 kg)  HR: 111bpm. SpO2 98% on room air. Urinalysis:       Fetal Status: Fetal Heart Rate (bpm): 154 Fundal Height: 36 cm Movement: Present  Presentation: Vertex  General:  Alert, oriented and cooperative. Patient is in no acute distress.  Skin: Skin is warm and dry. No rash noted.   Cardiovascular: Normal heart rate noted  Respiratory: Normal respiratory effort, no problems with respiration noted  Abdomen: Soft, gravid, appropriate for gestational age. Pain/Pressure: Present     Pelvic:  Cervical exam performed Dilation: 1 Effacement (%): 50 Station: -3  Extremities: Normal range of motion.     ental Status: Normal mood and affect. Normal behavior. Normal judgment and thought content.   NST: 135 bpm baseline, moderate variability, 15x15 accelerations, no decelerations. Tocometer: quiet   Assessment   42 y.o. [id number] at [redacted]w[redacted]d by  10/26/2018, by Est. Date of Conception presenting for routine prenatal visit  Plan   Pregnancy Problems (from 03/19/18 to present)    Problem Noted Resolved   Supervision of high risk pregnancy, antepartum 03/19/2018 by Will Bonnet, MD No   Overview Addendum 09/02/2018  8:37 AM by Homero Fellers, [place] Prenatal Labs  Dating U/s confirmed ART, embryo transfer Blood type: B/Positive/-- (04/22 1036)   Genetic Screen First trimester screen: negative Antibody:Negative (04/22 1036)  Anatomic [place] complete Rubella: 29.60 (04/22 1036) Varicella: Immune  GTT Early: 80             Third trimester: 127 RPR: Non Reactive (04/22 1036)   Rhogam Not applicable HBsAg: Confirm. indicated (04/22 1036)   TDaP vaccine   08/19/2018                     Flu Shot: 09/02/18 HIV: Non Reactive (04/22 1036)   Baby Food   Breast  GBS:   Contraception  considering nexplanon Pap: 2018 NIL, HPV negative  CBB     CS/VBAC  not applicable   Support Person           Supervision of pregnancy resulting from assisted reproductive technology 03/19/2018 by Will Bonnet, MD No   Overview  Addendum 05/27/2018  4:28 PM by Homero Fellers, MD    Two blastocyst transfer on 2/28 (day of conception of 02/02/18). Twin gestational verified by ultrasound 3/29 ([redacted]w[redacted]d GA) [ ]  fetal ECHO       Twin gestation in first trimester 03/19/2018 by Will Bonnet, MD No   Overview Addendum 03/19/2018  1:08 PM by Will Bonnet, MD    - Verify di-di twin gestation. Patient states she has ultrasound report.  - likely di-di due to two embryo transfers to obtain pregnancy  - no Pre-implantation Genetic Screening done prior to pregnancy. - embryos collected in 2014 and frozen used for this pregnancy. [ ]  start bASA after 12 weeks  Start low dose ASA at >[redacted] weeks gestation as per USPTF recommendation "Low-Dose Aspirin Use for the Prevention of Morbidity and Mortality From Preeclampsia: Preventive Medicine"  furthermore endorsed by ACOG, WHO, and NIH based on evidence level B for the prevention of preeclampsia  In women deemed high risk  (diabetes, renal disease, chronic hypertension, history of preeclampsia in prior gestation, autoimmune diseases, or multifetal gestations).  ACOG Committee Opinion 381 "Low-Dose Asprin Use During Pregnancy" June 25th 2018  High Risk (Start if 1 or more present) History of preeclampsia Multifetal Gestation Chronic HTN Type I or II DM Renal Disease Autoimmune Disease (SLE, Antiphospholipid antibody syndrome)  Moderate Risk (consider starting if more than one present) Nulliparity Obesity (BMI >30) Family history of Preeclampsia (Mother or sister) Socioeconomic characteristics (African American, low socieeconomic status) Age 48 years or older Personal history factors (low birthweight of SGA, previous adverse pregnancy outcome, more than 10 year pregnancy interval)      Obesity affecting pregnancy 03/19/2018 by Will Bonnet, MD No   Overview Signed 03/19/2018  1:03 PM by Will Bonnet, MD    [ ]  early 1h gtt [x]  Offer genetic screening      BMI  35.0-35.9,adult 03/19/2018 by Will Bonnet, MD No   History of preterm delivery, currently pregnant 03/19/2018 by Will Bonnet, MD No   Overview Signed 03/19/2018  1:02 PM by Will Bonnet, MD    - 17OHP not indicated for multiple gestations per ACOG PB130      Nausea and vomiting during pregnancy 03/19/2018 by Will Bonnet, MD No       Gestational age appropriate obstetric precautions including but not limited to vaginal bleeding, contractions, leaking of fluid and fetal movement were reviewed in detail with the patient.    Shortness of breath with ambulating and walking up stairs. Has had significant swelling which is now improved with working from home. Has been persistently tachycardic.  Will refer to cardiology who can obtain ECHO to evaluate ejection fraction.   GBS and GC/CT today.   Return in about 1 week (around 10/07/2018) for ROB and NST.  Adrian Prows MD [facility], [facility] 09/30/18 1:20 PM

## 2018-09-30 NOTE — Therapy (Signed)
[facility] [address] [place], [address] Phone: [phone]   Fax:  [phone]  Occupational Therapy Evaluation  Patient Details  Name: Laura Espinoza MRN: [id number] Date of Birth: [birth year] Referring Provider (OT): Durward Fortes   Encounter Date: 09/30/2018  OT End of Session - 09/30/18 1724    Visit Number  1    Number of Visits  1    Date for OT Re-Evaluation  09/30/18    OT Start Time  1346    OT Stop Time  1437    OT Time Calculation (min)  51 min    Activity Tolerance  Patient tolerated treatment well    Behavior During Therapy  [facility] for tasks assessed/performed       Past Medical History:  Diagnosis Date  . Allergic rhinitis   . Anxiety   . Breast mass, right 11/11/2017   Nov 09, 2017, add'l views ordered  . Gastritis   . Hepatitis B   . OM (otitis media), recurrent    as a child; ruptured TM  . Pneumonia 2007   hospitalized for possible pneumonia in [place] for 2 weeks  . Recurrent sinusitis     Past Surgical History:  Procedure Laterality Date  . fallopian tube removal Left March 2014  . INNER EAR SURGERY    . LAPAROSCOPIC OVARIAN CYSTECTOMY  2008   in [place]  . REFRACTIVE SURGERY Bilateral 1998  . TYMPANOPLASTY WITH GRAFT Left 2017   [place], Dr. Ned Grace    There were no vitals filed for this visit.  Subjective Assessment - 09/30/18 1715    Subjective   My carpal tunnel started about June to August but just got worse  gradually - R worse than L and then my R elbow  and shoulder stareted hurting - with my R shoulder worse - at night time I cannot get to comfortable and I can only sleep on my side     Patient Stated Goals  Want my shoulder pain and carpal tunnel symptoms better so I can sleep     Currently in Pain?  Yes    Pain Score  6     Pain Location  Shoulder    Pain Orientation  Right    Pain Descriptors / Indicators  Aching    Pain Type  Acute pain    Pain Onset  More  than a month ago    Pain Frequency  --   night time the worse    Aggravating Factors   Sleeping on my side , reaching out to side         [facility] OT Assessment - 09/30/18 0001      Assessment   Medical Diagnosis  Bilateral CTS, R elbow and shoulder pain     Referring Provider (OT)  [place]    Onset Date/Surgical Date  05/11/18    Hand Dominance  Right      Precautions   Required Braces or Orthoses  --   bilateral wrist splints      Home  Environment   Lives With  Family      Prior Function   Vocation  Full time employment    Leisure  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       Strength   Right Hand Grip (lbs)  25   30 extended arm    Left Hand Grip (lbs)  39  35 extended arm      Right Hand AROM   R Index  MCP 0-90  60 Degrees    R Index PIP 0-100  90 Degrees    R Long  MCP 0-90  60 Degrees    R Long PIP 0-100  90 Degrees    R Ring  MCP 0-90  60 Degrees    R Ring PIP 0-100  95 Degrees    R Little  MCP 0-90  60 Degrees    R Little PIP 0-100  95 Degrees      Left Hand AROM   L Index  MCP 0-90  60 Degrees    L Index PIP 0-100  90 Degrees    L Long  MCP 0-90  60 Degrees    L Long PIP 0-100  90 Degrees    L Ring  MCP 0-90  60 Degrees    L Ring PIP 0-100  90 Degrees    L Little  MCP 0-90  60 Degrees    L Little PIP 0-100  90 Degrees           Warm shower - cervical stretches - lat flexion , scalens stretches  Rotation all bilateral - hold 3-5 sec  10 x  Scapula squeezes - 10 reps  5 xday   Elbow on the R  2-3 x day  Heat, cross friction massage Ice massage   Hands - contrast 3 x day  Tendon glides   10 reps  Med N glide -5 reps   Wear wrist splints and also fitted with isotoner gloves as needed or to tolerance              OT Education - 09/30/18 1724    Education Details  findings and HEP     Person(s) Educated  Patient    Methods  Explanation;Demonstration;Handout    Comprehension  Verbalized  understanding;Returned demonstration          OT Long Term Goals - 09/30/18 1726      OT LONG TERM GOAL #1   Title  Pt to demo and verbalize understanding for HEP to decrease CTS , pain and R elbow and shoulder     Baseline  hand out provided and review - and pt demo HEP    Time  1    Period  Days    Status  Achieved    Target Date  09/30/18            Plan - 09/30/18 1727    Clinical Impression Statement  Pt present at OT eval this date 36 wks pregancy - pt refer for bilateral CTS with R hand worse than L , symptoms started about June and got worse gradually - pt numbness and pins and needles more constant  increase edema - unable to make composite fist , also show some R lateral epicondyle - tender and pain  - and with wrist extention - pt complain of R anterior shoulder pain -negative for  impingement - but tender, trigger points on upper traps , and rounded shoulders - pt did and still do at times cradle hand on chest , and work as Materials engineer - review with pt  home program  for CTS and lateral epicondyle - splint and compression glove wearing - doing contrast with some HEP  and for R shoudler discuss some modifications to work station and during sleeping - pt report she is working from home now -  pt to do scapula squeeze and avoid working and bring R arm into [employer] ADD - pt verbalize understanding     Occupational performance deficits (Please refer to evaluation for details):  ADL's;Rest and Sleep;Work;Play    Rehab Potential  Fair    Current Impairments/barriers affecting progress:  pregnant - 36 wks     OT Frequency  One time visit    OT Treatment/Interventions  Cryotherapy;Moist Heat;Contrast Bath;Therapeutic exercise;Patient/family education    Plan  Pt to cont with Homeprogram     OT Home Exercise Plan  see pt instruction     Consulted and Agree with Plan of Care  Patient       Patient will benefit from skilled therapeutic intervention in order to  improve the following deficits and impairments:  Pain, Impaired flexibility, Impaired sensation, Impaired UE functional use, Increased edema  Visit Diagnosis: Carpal tunnel syndrome, bilateral upper limbs - Plan: Ot plan of care cert/re-cert  Lateral epicondylitis of right elbow - Plan: Ot plan of care cert/re-cert  Acute pain of right shoulder - Plan: Ot plan of care cert/re-cert    Problem List Patient Active Problem List   Diagnosis Date Noted  . Carpal tunnel syndrome, left upper limb 09/17/2018  . Carpal tunnel syndrome, right upper limb 09/17/2018  . AMA (advanced maternal age) multigravida 78+, third trimester 08/19/2018  . Supervision of high risk pregnancy, antepartum 03/19/2018  . Supervision of pregnancy resulting from assisted reproductive technology 03/19/2018  . Twin gestation in first trimester 03/19/2018  . Obesity affecting pregnancy 03/19/2018  . BMI 35.0-35.9,adult 03/19/2018  . History of preterm delivery, currently pregnant 03/19/2018  . Nausea and vomiting during pregnancy 03/19/2018  . Breast mass, right 11/11/2017  . Vaccine for streptococcus pneumoniae and influenza 09/17/2017  . Visit for screening mammogram 09/17/2017  . Need for hepatitis A vaccination 09/15/2016  . Asthma 09/15/2016  . Perforated tympanic membrane 04/09/2016  . Right knee pain 11/17/2015  . Thoracic back pain 11/17/2015  . Preventative health care 11/17/2015  . Allergic rhinitis   . Anxiety   . Gastritis   . HBV (hepatitis B virus) infection 06/04/2014    Rosalyn Gess OTR/L,CLT 09/30/2018, 6:50 PM  [facility] [address], [place], [address] Phone: [phone]   Fax:  [phone]  Name: Laura Espinoza MRN: [id number] Date of Birth: July 01, 1976

## 2018-09-30 NOTE — Progress Notes (Signed)
ROB GBS/Aptima  C/o pain in joints, more so in hips

## 2018-10-01 ENCOUNTER — Encounter: Payer: Self-pay | Admitting: Nurse Practitioner

## 2018-10-01 ENCOUNTER — Other Ambulatory Visit: Payer: Self-pay

## 2018-10-01 ENCOUNTER — Ambulatory Visit: Payer: BLUE CROSS/BLUE SHIELD | Admitting: Cardiovascular Disease

## 2018-10-01 ENCOUNTER — Ambulatory Visit (INDEPENDENT_AMBULATORY_CARE_PROVIDER_SITE_OTHER): Payer: BLUE CROSS/BLUE SHIELD | Admitting: Nurse Practitioner

## 2018-10-01 ENCOUNTER — Ambulatory Visit (INDEPENDENT_AMBULATORY_CARE_PROVIDER_SITE_OTHER): Payer: BLUE CROSS/BLUE SHIELD

## 2018-10-01 VITALS — BP 134/78 | HR 97 | Ht 61.0 in | Wt 194.2 lb

## 2018-10-01 DIAGNOSIS — R0609 Other forms of dyspnea: Secondary | ICD-10-CM

## 2018-10-01 DIAGNOSIS — Z3A36 36 weeks gestation of pregnancy: Secondary | ICD-10-CM | POA: Diagnosis not present

## 2018-10-01 LAB — CERVICOVAGINAL ANCILLARY ONLY
Chlamydia: NEGATIVE
Neisseria Gonorrhea: NEGATIVE

## 2018-10-01 NOTE — Patient Instructions (Signed)
Medication Instructions:  Your physician recommends that you continue on your current medications as directed. Please refer to the Current Medication list given to you today.   If you need a refill on your cardiac medications before your next appointment, please call your pharmacy.   Lab work: NONE If you have labs (blood work) drawn today and your tests are completely normal, you will receive your results only by: Marland Kitchen MyChart Message (if you have MyChart) OR . A paper copy in the mail If you have any lab test that is abnormal or we need to change your treatment, we will call you to review the results.  Testing/Procedures: Your physician has requested that you have an echocardiogram. Echocardiography is a painless test that uses sound waves to create images of your heart. It provides your doctor with information about the size and shape of your heart and how well your heart's chambers and valves are working. This procedure takes approximately one hour. There are no restrictions for this procedure. You may get an IV, if needed, to receive an ultrasound enhancing agent through to better visualize your heart.     Follow-Up: At [facility], you and your health needs are our priority.  As part of our continuing mission to provide you with exceptional heart care, we have created designated Provider Care Teams.  These Care Teams include your primary Cardiologist (physician) and Advanced Practice Providers (APPs -  Physician Assistants and Nurse Practitioners) who all work together to provide you with the care you need, when you need it. You will need a follow up appointment in AS NEEDED PENDING ECHO RESULTS.     Echocardiogram An echocardiogram, or echocardiography, uses sound waves (ultrasound) to produce an image of your heart. The echocardiogram is simple, painless, obtained within a short period of time, and offers valuable information to your health care provider. The images from an  echocardiogram can provide information such as:  Evidence of coronary artery disease (CAD).  Heart size.  Heart muscle function.  Heart valve function.  Aneurysm detection.  Evidence of a past heart attack.  Fluid buildup around the heart.  Heart muscle thickening.  Assess heart valve function.  Tell a health care provider about:  Any allergies you have.  All medicines you are taking, including vitamins, herbs, eye drops, creams, and over-the-counter medicines.  Any problems you or family members have had with anesthetic medicines.  Any blood disorders you have.  Any surgeries you have had.  Any medical conditions you have.  Whether you are pregnant or may be pregnant. What happens before the procedure? No special preparation is needed. Eat and drink normally. What happens during the procedure?  In order to produce an image of your heart, gel will be applied to your chest and a wand-like tool (transducer) will be moved over your chest. The gel will help transmit the sound waves from the transducer. The sound waves will harmlessly bounce off your heart to allow the heart images to be captured in real-time motion. These images will then be recorded.  You may need an IV to receive a medicine that improves the quality of the pictures. What happens after the procedure? You may return to your normal schedule including diet, activities, and medicines, unless your health care provider tells you otherwise. This information is not intended to replace advice given to you by your health care provider. Make sure you discuss any questions you have with your health care provider. Document Released: 11/24/2000 Document Revised:  07/15/2016 Document Reviewed: 08/04/2013 Elsevier Interactive Patient Education  2017 [employer].

## 2018-10-01 NOTE — Progress Notes (Signed)
Negative, Released to mychart 

## 2018-10-01 NOTE — Progress Notes (Signed)
Office Visit    Patient Name: DEONDRA LABRADOR Date of Encounter: 10/01/2018  Primary Care Provider:  Arnetha Courser, MD Primary Cardiologist:  Kathlyn Sacramento, MD  Chief Complaint    42 year old female with a prior history of chest pain and dyspnea with normal testing in early 2018, hepatitis B, gastritis, anxiety, and pregnancy at 36 weeks, who presents for evaluation secondary to dyspnea on exertion.  Past Medical History    Past Medical History:  Diagnosis Date  . Allergic rhinitis   . Anxiety   . Breast mass, right 11/11/2017   Nov 09, 2017, add'l views ordered  . Chest pain    a. 12/2015 ETT: Ex time: 8:00, Max HR 169 (92%), BP 201/71, METS 10.1, No ecg changes.  Marland Kitchen Dyspnea on exertion    a. 12/2015 Echo: EF 60-65%, no rwma.  . Gastritis   . Hepatitis B   . OM (otitis media), recurrent    as a child; ruptured TM  . Pneumonia 2007   hospitalized for possible pneumonia in [place] for 2 weeks  . Recurrent sinusitis    Past Surgical History:  Procedure Laterality Date  . fallopian tube removal Left March 2014  . INNER EAR SURGERY    . LAPAROSCOPIC OVARIAN CYSTECTOMY  2008   in [place]  . REFRACTIVE SURGERY Bilateral 1998  . TYMPANOPLASTY WITH GRAFT Left 2017   [place], Dr. Ned Grace    Allergies  Allergies  Allergen Reactions  . Procaine Anaphylaxis and Shortness Of Breath    History of Present Illness    42 year old female with the above past medical history including hepatitis B, gastritis, and anxiety.  She is currently [redacted] weeks pregnant.  In December 2016, she was seen in cardiology clinic for chest pain and dyspnea.  Symptoms were felt to be atypical and were followed by stress testing in early 2017 which was normal.  Echocardiogram also showed normal LV function without any significant valvular abnormalities.  She has done well over the past 2 years however, in the setting of pregnancy, about 2-1/2 months ago, she started to notice increasing dyspnea on  exertion.  She recently mentioned this to her obstetrician and was referred back to cardiology for evaluation.  She says she will typically noticed dyspnea if walking up stairs or long distances in any sort of faster pace.  She has not had any PND, orthopnea, dizziness, syncope, chest pain, or early satiety.  She does note occasional mild lower extremity swelling.  Home Medications    Prior to Admission medications   Medication Sig Start Date End Date Taking? Authorizing Provider  aspirin EC 81 MG tablet Take 1 tablet (81 mg total) by mouth daily. Take after 12 weeks for prevention of preeclampssia later in pregnancy 04/01/18  Yes Schuman, Christanna R, MD  Doxylamine-Pyridoxine (DICLEGIS) 10-10 MG TBEC 2 tabs PO QHS, 1 tab PO in AM, 1 tab PO in afternoon PRN nausea 03/19/18  Yes Will Bonnet, MD  famotidine (PEPCID) 20 MG tablet Take by mouth.   Yes [provider]  Ferrous Sulfate Dried 45 MG TBCR Take by mouth.   Yes [provider]  hydroxyprogesterone caproate (MAKENA) 250 mg/mL OIL injection INJECT 1 ML(250 MG TOTAL) INTO MUSCLE ONCE WEEKLY. WILL USE IN OFFICE FROM 16 WEEKS TO 36 WEEKS OF PREGNANCY 09/26/18  Yes Gae Dry, MD  loratadine (CLARITIN) 10 MG tablet Take 10 mg by mouth daily.   Yes [provider]  Prenatal Vit-Fe Fumarate-FA (PRENATAL  VITAMIN PO) Take by mouth.   Yes [provider]    Review of Systems    Dyspnea on exertion as outlined above with occasional ankle edema.  She denies chest pain, palpitations, PND, orthopnea, dizziness, syncope, or early satiety.  All other systems reviewed and are otherwise negative except as noted above.  Physical Exam    VS:  BP 134/78 (BP Location: Left Arm, Patient Position: Sitting, Cuff Size: Normal)   Pulse 97   Ht 5\' 1"  (1.549 m)   Wt 194 lb 4 oz (88.1 kg)   LMP 01/12/2018   SpO2 98%   BMI 36.70 kg/m  , BMI Body mass index is 36.7 kg/m. GEN: Well nourished, well developed, in no  acute distress. HEENT: normal. Neck: Supple, no JVD, carotid bruits, or masses. Cardiac: RRR, 1/6 systolic ejection murmur at the upper sternal borders, no rubs, or gallops. No clubbing, cyanosis, edema.  Radials/DP/PT 2+ and equal bilaterally.  Respiratory:  Respirations regular and unlabored, clear to auscultation bilaterally. GI: Protuberant in setting of pregnancy.  Nontender, nondistended, BS + x 4. MS: no deformity or atrophy. Skin: warm and dry, no rash. Neuro:  Strength and sensation are intact. Psych: Normal affect.  Accessory Clinical Findings    ECG personally reviewed by me today -sinus arrhythmia, 97, with biphasic P waves noted in inferolateral leads (lead placement checked and ECG repeated).  Assessment & Plan    1.  Dyspnea on exertion: Patient is [redacted] weeks pregnant and over the past 2-1/2 months as noted progressive dyspnea on exertion.  She has also had intermittent mild lower extremity swelling.  She has not had any chest pain, PND, or orthopnea.  She is euvolemic on examination.  ECG without acute ST or T changes.  I will arrange for an echocardiogram to evaluate LV function.  Provided that this is normal, would not pursue any additional evaluation.  2.  Disposition: Follow-up echocardiogram.  Provided that this is normal, follow-up in clinic as needed.   Murray Hodgkins, NP 10/01/2018, 12:24 PM

## 2018-10-02 LAB — ECHOCARDIOGRAM COMPLETE
Height: 61 in
Weight: 3108 oz

## 2018-10-03 ENCOUNTER — Observation Stay
Admission: EM | Admit: 2018-10-03 | Discharge: 2018-10-03 | Disposition: A | Discharge status: Home / Self Care | Payer: BLUE CROSS/BLUE SHIELD | Attending: Obstetrics & Gynecology | Admitting: Obstetrics & Gynecology

## 2018-10-03 ENCOUNTER — Other Ambulatory Visit: Payer: Self-pay

## 2018-10-03 ENCOUNTER — Encounter: Payer: Self-pay | Admitting: Obstetrics and Gynecology

## 2018-10-03 DIAGNOSIS — Z7982 Long term (current) use of aspirin: Secondary | ICD-10-CM | POA: Diagnosis not present

## 2018-10-03 DIAGNOSIS — Z79899 Other long term (current) drug therapy: Secondary | ICD-10-CM | POA: Diagnosis not present

## 2018-10-03 DIAGNOSIS — O99513 Diseases of the respiratory system complicating pregnancy, third trimester: Secondary | ICD-10-CM | POA: Diagnosis not present

## 2018-10-03 DIAGNOSIS — O26853 Spotting complicating pregnancy, third trimester: Secondary | ICD-10-CM | POA: Diagnosis not present

## 2018-10-03 DIAGNOSIS — J309 Allergic rhinitis, unspecified: Secondary | ICD-10-CM | POA: Insufficient documentation

## 2018-10-03 DIAGNOSIS — O9989 Other specified diseases and conditions complicating pregnancy, childbirth and the puerperium: Secondary | ICD-10-CM | POA: Insufficient documentation

## 2018-10-03 DIAGNOSIS — R1030 Lower abdominal pain, unspecified: Secondary | ICD-10-CM | POA: Insufficient documentation

## 2018-10-03 DIAGNOSIS — Z3A36 36 weeks gestation of pregnancy: Secondary | ICD-10-CM | POA: Insufficient documentation

## 2018-10-03 DIAGNOSIS — Z9079 Acquired absence of other genital organ(s): Secondary | ICD-10-CM | POA: Insufficient documentation

## 2018-10-03 DIAGNOSIS — O09523 Supervision of elderly multigravida, third trimester: Secondary | ICD-10-CM | POA: Diagnosis not present

## 2018-10-03 LAB — URINALYSIS, COMPLETE (UACMP) WITH MICROSCOPIC
BACTERIA UA: NONE SEEN
BILIRUBIN URINE: NEGATIVE
Glucose, UA: NEGATIVE mg/dL
Hgb urine dipstick: NEGATIVE
Ketones, ur: NEGATIVE mg/dL
LEUKOCYTES UA: NEGATIVE
Nitrite: NEGATIVE
PH: 6.5 (ref 5.0–8.0)
Protein, ur: NEGATIVE mg/dL
SPECIFIC GRAVITY, URINE: 1.015 (ref 1.005–1.030)

## 2018-10-03 NOTE — OB Triage Note (Signed)
Pt presents with c/o vaginal bleeding, began around 0200 when pt voided, when pt wiped she noted "small, bright red blood." Pt now states she only has "pink" when she wipes with voiding. Pt reports + fetal movement. Denies leaking of fluid. Pt states she "thinks" she has been having "some contractions" which she reports as "tightening." Pt also states she has been having 2/10 pain in lower abdomen that intermittently intensifies. Toco and EFM applied and explained. All questions answered. Will monitor closely.

## 2018-10-03 NOTE — Discharge Summary (Signed)
See Final Progress Note 10/03/2018.

## 2018-10-03 NOTE — Discharge Instructions (Signed)
Please call or return to the Birthplace at [facility] with any of the following concerns: Vaginal Bleeding Decreased Fetal Movement Leaking of Fluid Contractions every 3-5 mins for an hour

## 2018-10-03 NOTE — Final Progress Note (Signed)
Physician Final Progress Note  Patient ID: Laura Espinoza MRN: [id number] DOB/AGE: 41-Feb-1977 41 y.o.  Admit date: 10/03/2018 Admitting provider: Gae Dry, MD Discharge date: 10/03/2018   Admission Diagnoses:    Indication for care in labor and delivery, antepartum  Discharge Diagnoses:  Active Problems: Supervision of high risk pregnancy  History of Present Illness: The patient is a 42 y.o. female [phone] at [redacted]w[redacted]d who presents for a small amount of bright red blood when she wiped after voiding around 0200 today. She has since voided again here in triage and saw pink on the paper when wiping.She has been having some intermittent mild lower abdominal pain that she rates as 2/10. No leaking of fluid. No abnormal discharge or odor. Good fetal movement reported.   Review of Systems: Review of systems negative unless otherwise noted in HPI.   Past Medical History:  Diagnosis Date  . Allergic rhinitis   . Anxiety   . Breast mass, right 11/11/2017   Nov 09, 2017, add'l views ordered  . Chest pain    a. 12/2015 ETT: Ex time: 8:00, Max HR 169 (92%), BP 201/71, METS 10.1, No ecg changes.  Marland Kitchen Dyspnea on exertion    a. 12/2015 Echo: EF 60-65%, no rwma.  . Gastritis   . Hepatitis B   . OM (otitis media), recurrent    as a child; ruptured TM  . Pneumonia 2007   hospitalized for possible pneumonia in [place] for 2 weeks  . Recurrent sinusitis     Past Surgical History:  Procedure Laterality Date  . fallopian tube removal Left March 2014  . INNER EAR SURGERY    . LAPAROSCOPIC OVARIAN CYSTECTOMY  2008   in [place]  . REFRACTIVE SURGERY Bilateral 1998  . TYMPANOPLASTY WITH GRAFT Left 2017   [place], Dr. Ned Grace    No current facility-administered medications on file prior to encounter.    Current Outpatient Medications on File Prior to Encounter  Medication Sig Dispense Refill  . aspirin EC 81 MG tablet Take 1 tablet (81 mg total) by mouth daily. Take after 12 weeks for  prevention of preeclampssia later in pregnancy 300 tablet 2  . Doxylamine-Pyridoxine (DICLEGIS) 10-10 MG TBEC 2 tabs PO QHS, 1 tab PO in AM, 1 tab PO in afternoon PRN nausea 120 tablet 3  . Prenatal Vit-Fe Fumarate-FA (PRENATAL VITAMIN PO) Take by mouth.    . famotidine (PEPCID) 20 MG tablet Take by mouth.    . Ferrous Sulfate Dried 45 MG TBCR Take by mouth.    . hydroxyprogesterone caproate (MAKENA) 250 mg/mL OIL injection INJECT 1 ML(250 MG TOTAL) INTO MUSCLE ONCE WEEKLY. WILL USE IN OFFICE FROM 16 WEEKS TO 36 WEEKS OF PREGNANCY (Patient not taking: Reported on 10/03/2018) 5 mL 0  . loratadine (CLARITIN) 10 MG tablet Take 10 mg by mouth daily.      Allergies  Allergen Reactions  . Procaine Anaphylaxis and Shortness Of Breath    Social History   Socioeconomic History  . Marital status: Married    Spouse name: Not on file  . Number of children: Not on file  . Years of education: Not on file  . Highest education level: Not on file  Occupational History  . Not on file  Social Needs  . Financial resource strain: Not on file  . Food insecurity:    Worry: Not on file    Inability: Not on file  . Transportation needs:    Medical: Not on file  Non-medical: Not on file  Tobacco Use  . Smoking status: Never Smoker  . Smokeless tobacco: Never Used  Substance and Sexual Activity  . Alcohol use: Not Currently  . Drug use: No  . Sexual activity: Yes  Lifestyle  . Physical activity:    Days per week: Not on file    Minutes per session: Not on file  . Stress: Not on file  Relationships  . Social connections:    Talks on phone: Not on file    Gets together: Not on file    Attends religious service: Not on file    Active member of club or organization: Not on file    Attends meetings of clubs or organizations: Not on file    Relationship status: Not on file  . Intimate partner violence:    Fear of current or ex partner: Not on file    Emotionally abused: Not on file     Physically abused: Not on file    Forced sexual activity: Not on file  Other Topics Concern  . Not on file  Social History Narrative  . Not on file    Family history: Negative/unremarkable except as detailed in HPI. No family history of birth defects.   Physical Exam: BP 129/75   Pulse 96   Temp 98.8 F (37.1 C) (Oral)   Resp 16   Ht 5\' 1"  (1.549 m)   Wt 88.1 kg   LMP 01/12/2018   BMI 36.70 kg/m   Gen: NAD Pulm: No increased work of breathing Pelvic: 0.5/50/-3, unchanged from reported findings on last clinic visit, no blood present on exam Ext: No signs of DVT  NST: Baseline: 130 Variability: moderate Accelerations: present Decelerations: absent Tocometry: occasional uterine irritability The patient was monitored for 30+ minutes, fetal heart rate tracing was deemed reactive.  Consults: None  Significant Findings/ Diagnostic Studies: labs: UA negative  Procedures: NST  Discharge Condition: good  Disposition: Discharge disposition: 01-Home or Self Care     home  Diet: Regular diet  Discharge Activity: Activity as tolerated  Discharge Instructions    Discharge activity:  No Restrictions   Complete by:  As directed    Discharge diet:  No restrictions   Complete by:  As directed    Fetal Kick Count:  Lie on our left side for one hour after a meal, and count the number of times your baby kicks.  If it is less than 5 times, get up, move around and drink some juice.  Repeat the test 30 minutes later.  If it is still less than 5 kicks in an hour, notify your doctor.   Complete by:  As directed    LABOR:  When conractions begin, you should start to time them from the beginning of one contraction to the beginning  of the next.  When contractions are 5 - 10 minutes apart or less and have been regular for at least an hour, you should call your health care provider.   Complete by:  As directed    No sexual activity restrictions   Complete by:  As directed    Notify  physician for bleeding from the vagina   Complete by:  As directed    Notify physician for blurring of vision or spots before the eyes   Complete by:  As directed    Notify physician for chills or fever   Complete by:  As directed    Notify physician for fainting spells, "black outs" or  loss of consciousness   Complete by:  As directed    Notify physician for increase in vaginal discharge   Complete by:  As directed    Notify physician for leaking of fluid   Complete by:  As directed    Notify physician for pain or burning when urinating   Complete by:  As directed    Notify physician for pelvic pressure (sudden increase)   Complete by:  As directed    Notify physician for severe or continued nausea or vomiting   Complete by:  As directed    Notify physician for sudden gushing of fluid from the vagina (with or without continued leaking)   Complete by:  As directed    Notify physician for sudden, constant, or occasional abdominal pain   Complete by:  As directed    Notify physician if baby moving less than usual   Complete by:  As directed      Allergies as of 10/03/2018      Reactions   Procaine Anaphylaxis, Shortness Of Breath      Medication List    STOP taking these medications   hydroxyprogesterone caproate 250 mg/mL Oil injection Commonly known as:  MAKENA     TAKE these medications   aspirin EC 81 MG tablet Take 1 tablet (81 mg total) by mouth daily. Take after 12 weeks for prevention of preeclampssia later in pregnancy   Doxylamine-Pyridoxine 10-10 MG Tbec 2 tabs PO QHS, 1 tab PO in AM, 1 tab PO in afternoon PRN nausea   famotidine 20 MG tablet Commonly known as:  PEPCID Take by mouth.   Ferrous Sulfate Dried 45 MG Tbcr Take by mouth.   loratadine 10 MG tablet Commonly known as:  CLARITIN Take 10 mg by mouth daily.   PRENATAL VITAMIN PO Take by mouth.      No appreciable bleeding or signs of UTI, reactive NST with some uterine irritability. Keep  scheduled OB visit.  Signed: Rexene Agent, CNM  10/03/2018, 4:54 AM

## 2018-10-04 LAB — URINE CULTURE: Culture: NO GROWTH

## 2018-10-04 LAB — CULTURE, BETA STREP (GROUP B ONLY): STREP GP B CULTURE: NEGATIVE

## 2018-10-04 NOTE — Progress Notes (Signed)
Negative, Released to mychart 

## 2018-10-07 ENCOUNTER — Ambulatory Visit (INDEPENDENT_AMBULATORY_CARE_PROVIDER_SITE_OTHER): Payer: BLUE CROSS/BLUE SHIELD | Admitting: Obstetrics & Gynecology

## 2018-10-07 VITALS — BP 130/80 | Wt 196.0 lb

## 2018-10-07 DIAGNOSIS — O09813 Supervision of pregnancy resulting from assisted reproductive technology, third trimester: Secondary | ICD-10-CM | POA: Diagnosis not present

## 2018-10-07 DIAGNOSIS — O09523 Supervision of elderly multigravida, third trimester: Secondary | ICD-10-CM

## 2018-10-07 DIAGNOSIS — O0990 Supervision of high risk pregnancy, unspecified, unspecified trimester: Secondary | ICD-10-CM

## 2018-10-07 DIAGNOSIS — O09819 Supervision of pregnancy resulting from assisted reproductive technology, unspecified trimester: Secondary | ICD-10-CM

## 2018-10-07 DIAGNOSIS — Z3A37 37 weeks gestation of pregnancy: Secondary | ICD-10-CM

## 2018-10-07 DIAGNOSIS — O99213 Obesity complicating pregnancy, third trimester: Secondary | ICD-10-CM | POA: Diagnosis not present

## 2018-10-07 DIAGNOSIS — O09219 Supervision of pregnancy with history of pre-term labor, unspecified trimester: Secondary | ICD-10-CM

## 2018-10-07 DIAGNOSIS — O09899 Supervision of other high risk pregnancies, unspecified trimester: Secondary | ICD-10-CM

## 2018-10-07 DIAGNOSIS — O09213 Supervision of pregnancy with history of pre-term labor, third trimester: Secondary | ICD-10-CM

## 2018-10-07 LAB — FETAL NONSTRESS TEST

## 2018-10-07 NOTE — Patient Instructions (Signed)
Braxton Hicks Contractions °Contractions of the uterus can occur throughout pregnancy, but they are not always a sign that you are in labor. You may have practice contractions called Braxton Hicks contractions. These false labor contractions are sometimes confused with true labor. °What are Braxton Hicks contractions? °Braxton Hicks contractions are tightening movements that occur in the muscles of the uterus before labor. Unlike true labor contractions, these contractions do not result in opening (dilation) and thinning of the cervix. Toward the end of pregnancy (32-34 weeks), Braxton Hicks contractions can happen more often and may become stronger. These contractions are sometimes difficult to tell apart from true labor because they can be very uncomfortable. You should not feel embarrassed if you go to the hospital with false labor. °Sometimes, the only way to tell if you are in true labor is for your health care provider to look for changes in the cervix. The health care provider will do a physical exam and may monitor your contractions. If you are not in true labor, the exam should show that your cervix is not dilating and your water has not broken. °If there are other health problems associated with your pregnancy, it is completely safe for you to be sent home with false labor. You may continue to have Braxton Hicks contractions until you go into true labor. °How to tell the difference between true labor and false labor °True labor °· Contractions last 30-70 seconds. °· Contractions become very regular. °· Discomfort is usually felt in the top of the uterus, and it spreads to the lower abdomen and low back. °· Contractions do not go away with walking. °· Contractions usually become more intense and increase in frequency. °· The cervix dilates and gets thinner. °False labor °· Contractions are usually shorter and not as strong as true labor contractions. °· Contractions are usually irregular. °· Contractions  are often felt in the front of the lower abdomen and in the groin. °· Contractions may go away when you walk around or change positions while lying down. °· Contractions get weaker and are shorter-lasting as time goes on. °· The cervix usually does not dilate or become thin. °Follow these instructions at home: °· Take over-the-counter and prescription medicines only as told by your health care provider. °· Keep up with your usual exercises and follow other instructions from your health care provider. °· Eat and drink lightly if you think you are going into labor. °· If Braxton Hicks contractions are making you uncomfortable: °? Change your position from lying down or resting to walking, or change from walking to resting. °? Sit and rest in a tub of warm water. °? Drink enough fluid to keep your urine pale yellow. Dehydration may cause these contractions. °? Do slow and deep breathing several times an hour. °· Keep all follow-up prenatal visits as told by your health care provider. This is important. °Contact a health care provider if: °· You have a fever. °· You have continuous pain in your abdomen. °Get help right away if: °· Your contractions become stronger, more regular, and closer together. °· You have fluid leaking or gushing from your vagina. °· You pass blood-tinged mucus (bloody show). °· You have bleeding from your vagina. °· You have low back pain that you never had before. °· You feel your baby’s head pushing down and causing pelvic pressure. °· Your baby is not moving inside you as much as it used to. °Summary °· Contractions that occur before labor are called Braxton   Hicks contractions, false labor, or practice contractions. °· Braxton Hicks contractions are usually shorter, weaker, farther apart, and less regular than true labor contractions. True labor contractions usually become progressively stronger and regular and they become more frequent. °· Manage discomfort from Braxton Hicks contractions by  changing position, resting in a warm bath, drinking plenty of water, or practicing deep breathing. °This information is not intended to replace advice given to you by your health care provider. Make sure you discuss any questions you have with your health care provider. °Document Released: 04/12/2017 Document Revised: 04/12/2017 Document Reviewed: 04/12/2017 °Elsevier Interactive Patient Education © 2018 [employer] Inc. ° °

## 2018-10-07 NOTE — Progress Notes (Signed)
  Subjective  Fetal Movement? yes Contractions? no Leaking Fluid? no Vaginal Bleeding? no Mucus plug last week Objective  BP 130/80   Wt 196 lb (88.9 kg)   LMP 01/12/2018   BMI 37.03 kg/m  General: NAD Pumonary: no increased work of breathing Abdomen: gravid, non-tender Extremities: no edema Psychiatric: mood appropriate, affect full  Assessment  42 y.o. [id number] at [redacted]w[redacted]d by  10/26/2018, by Est. Date of Conception presenting for routine prenatal visit  Plan   Problem List Items Addressed This Visit      Other   Supervision of high risk pregnancy, antepartum   Supervision of pregnancy resulting from assisted reproductive technology   Obesity affecting pregnancy   History of preterm delivery, currently pregnant   AMA (advanced maternal age) multigravida 35+, third trimester    Other Visit Diagnoses    [redacted] weeks gestation of pregnancy    -  Primary    A NST procedure was performed with FHR monitoring and a normal baseline established, appropriate time of 20-40 minutes of evaluation, and accels >2 seen w 15x15 characteristics.  Results show a REACTIVE NST.   GBS neg discussed Labor precautions  Barnett Applebaum, MD, [name], [facility] 10/07/2018  9:12 AM

## 2018-10-13 IMAGING — US US ABDOMEN COMPLETE W/ ELASTOGRAPHY
1 series · 13 of 25 positions shown · non-contrast
Comparison: 12/01/2016

CLINICAL DATA: Chronic active hepatitis B



[Series 1: us abdomen complete w/ elastography · 0.22mm/px · 13 of 107 slices shown]
[im 1/107]
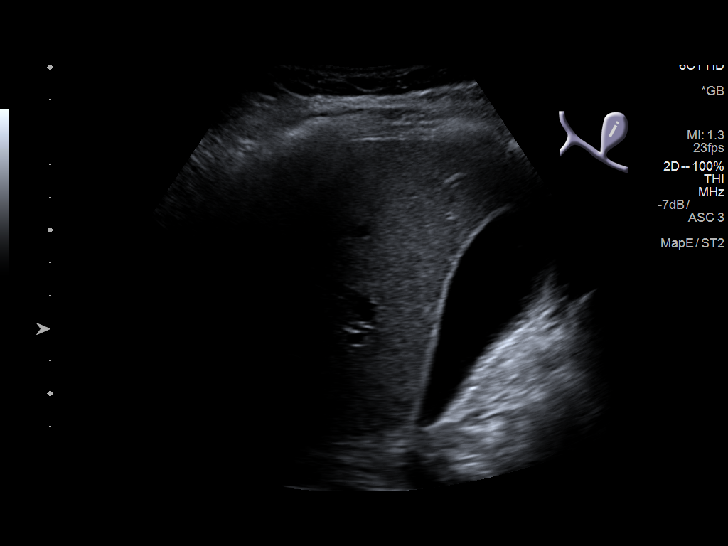
[im 9/107]
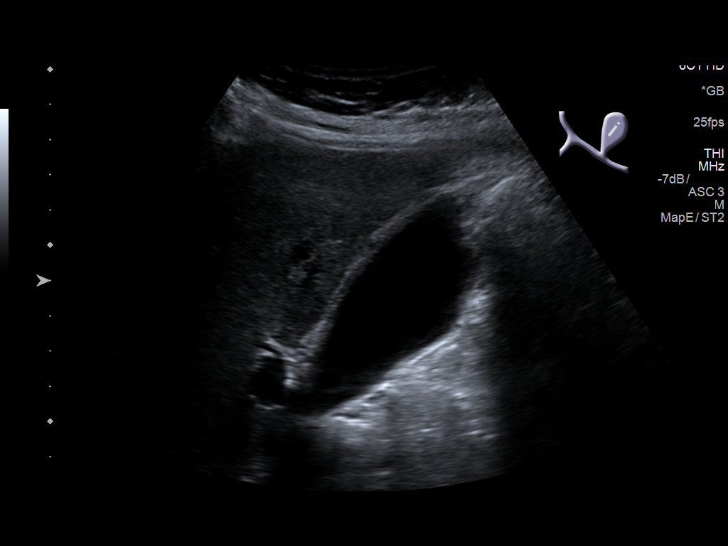
[im 18/107]
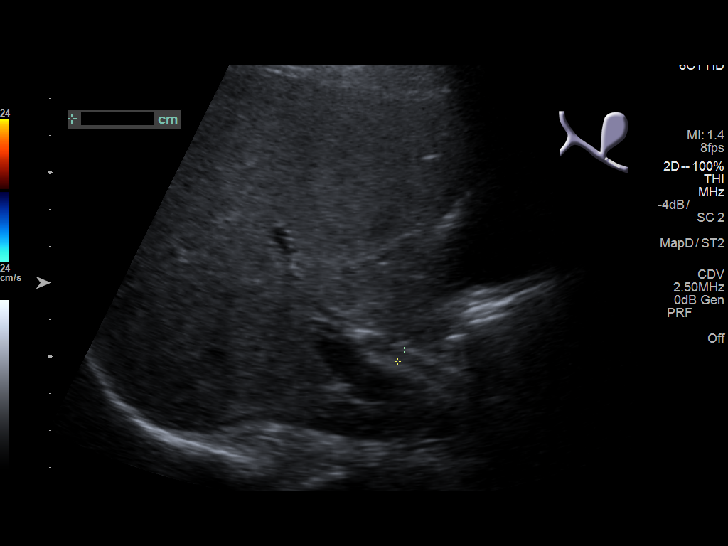
[im 27/107]
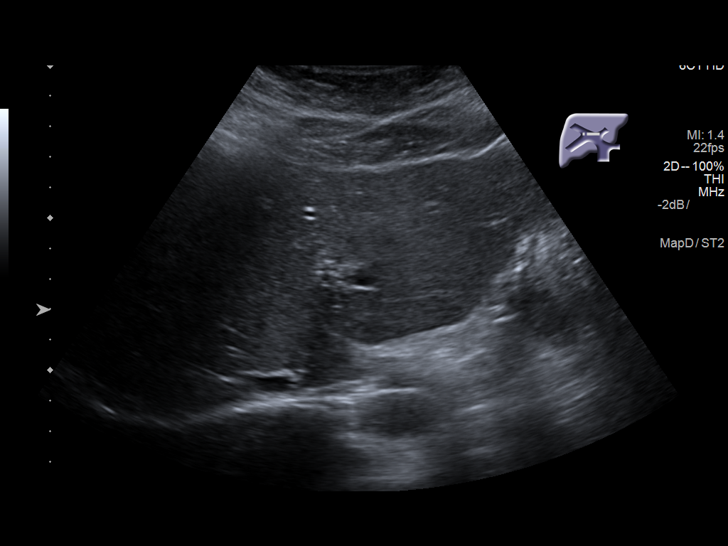
[im 36/107]
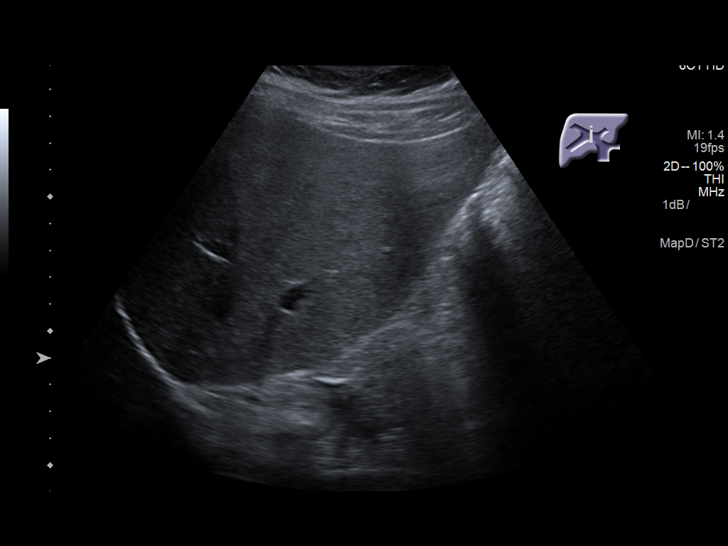
[im 45/107]
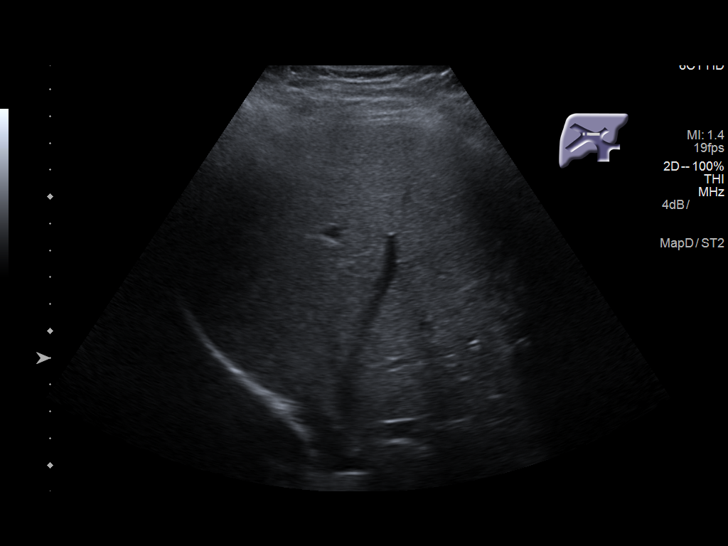
[im 54/107]
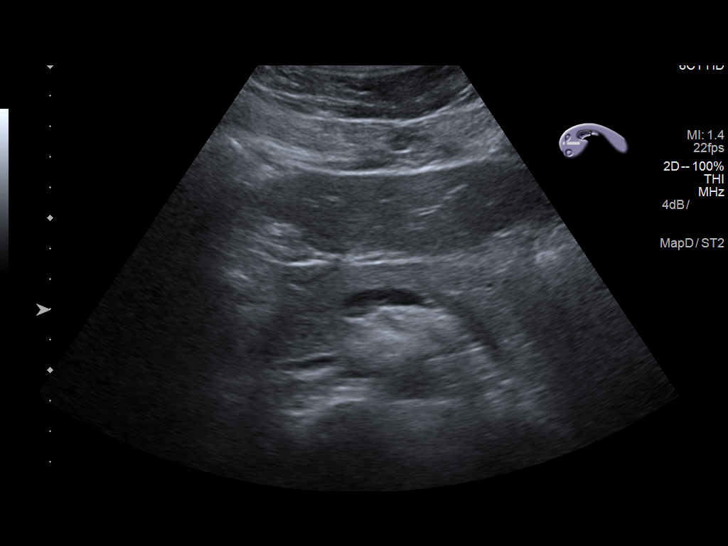
[im 62/107]
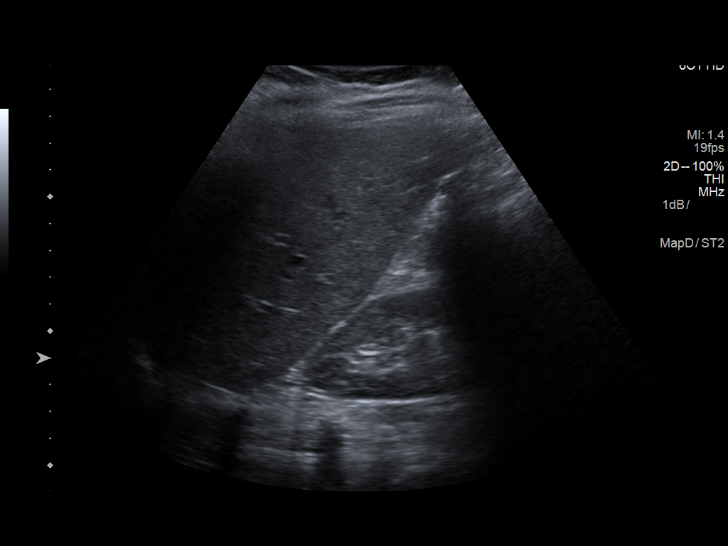
[im 71/107]
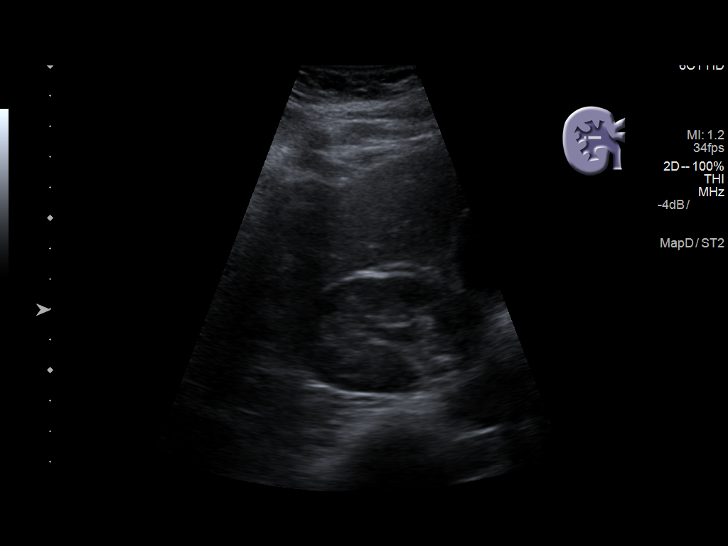
[im 80/107]
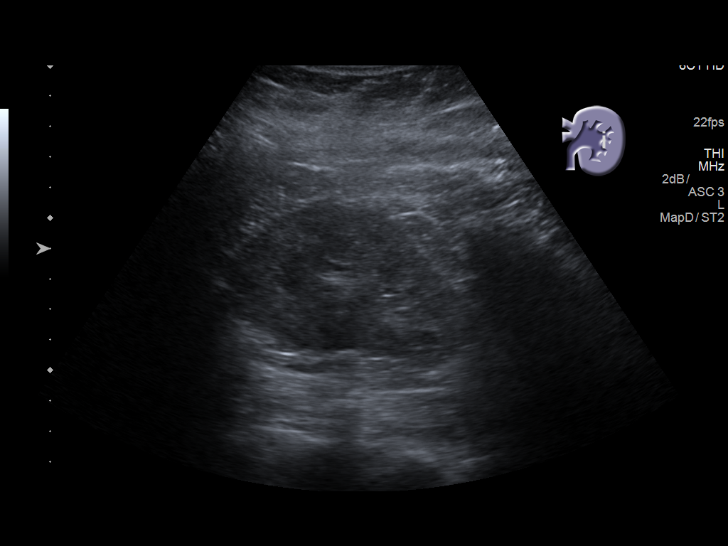
[im 89/107]
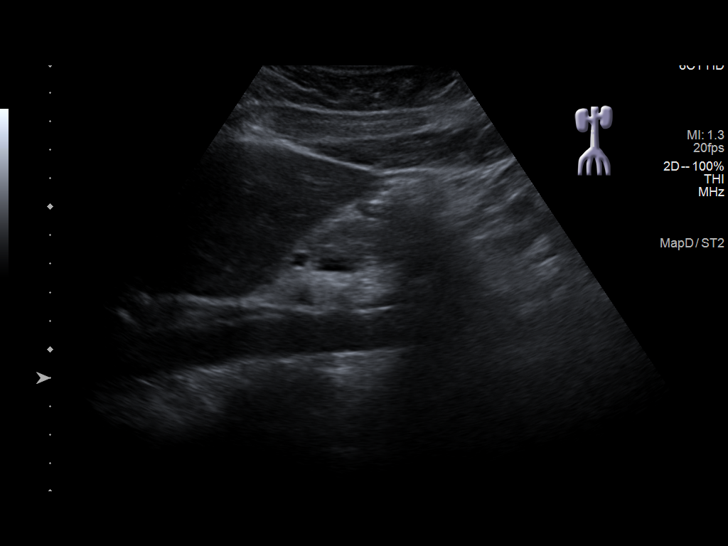
[im 98/107]
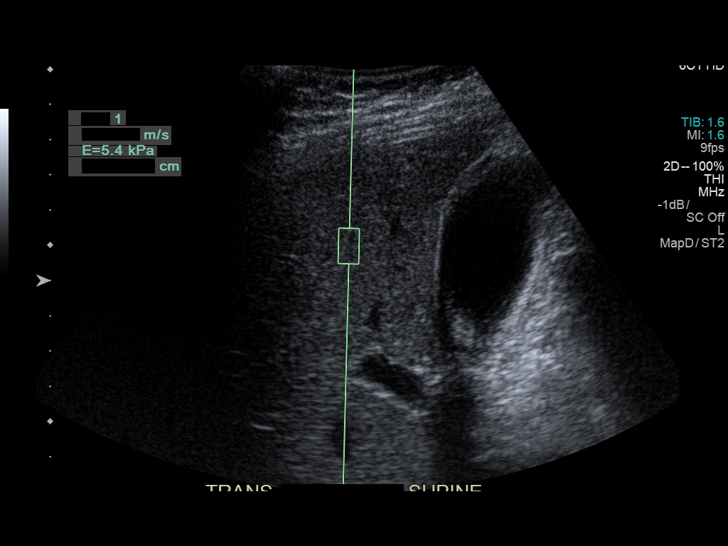
[im 107/107]
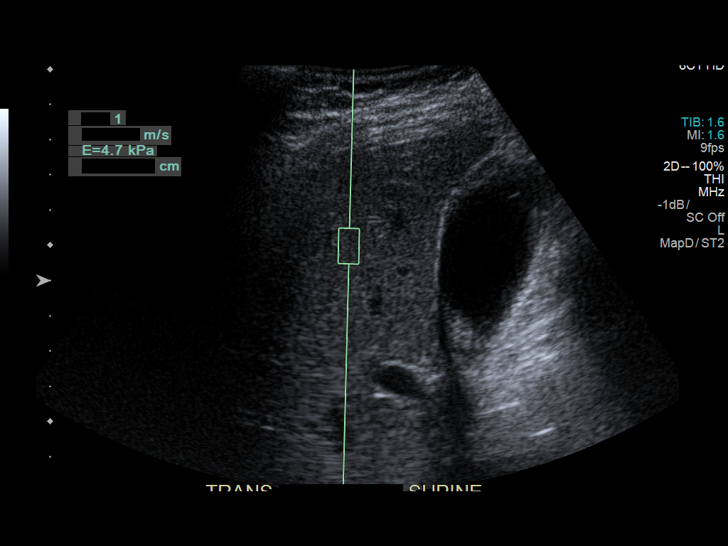

[13 of 25 positions shown; findings below may reference images not displayed]

FINDINGS: ULTRASOUND ABDOMEN

Gallbladder: No gallstones or wall thickening visualized. No
sonographic Murphy sign noted by sonographer.

Common bile duct: Diameter: Normal caliber, 4 mm

Liver: No focal lesion identified. Within normal limits in
parenchymal echogenicity. Portal vein is patent on color Doppler
imaging with normal direction of blood flow towards the liver.

IVC: No abnormality visualized.

Pancreas: Visualized portion unremarkable.

Spleen: Size and appearance within normal limits.

Right Kidney: Length: 9.8 cm. Echogenicity within normal limits. No
mass or hydronephrosis visualized.

Left Kidney: Length: 9.2 cm. Echogenicity within normal limits. No
mass or hydronephrosis visualized.

Abdominal aorta: No aneurysm visualized.

Other findings: None.

ULTRASOUND HEPATIC ELASTOGRAPHY

Device: Siemens Helix VTQ

Patient position: Supine

Transducer 6C1

Number of measurements: 10

Hepatic segment:  8

Median velocity:   1.25  m/sec

IQR:

IQR/Median velocity ratio:

Corresponding Metavir fibrosis score:  F2 + some F3

Risk of fibrosis: Moderate

Limitations of exam: None

Pertinent findings noted on other imaging exams:  None

Please note that abnormal shear wave velocities may also be
identified in clinical settings other than with hepatic fibrosis,
such as: acute hepatitis, elevated right heart and central venous
pressures including use of beta blockers, [place] disease
(Natividad), infiltrative processes such as
mastocytosis/amyloidosis/infiltrative tumor, extrahepatic
cholestasis, in the post-prandial state, and liver transplantation.
Correlation with patient history, laboratory data, and clinical
condition recommended.
IMPRESSION: ULTRASOUND ABDOMEN:
No acute findings.

ULTRASOUND HEPATIC ELASTOGRAPHY:

Median hepatic shear wave velocity is calculated at 1.25 m/sec.

Corresponding Metavir fibrosis score is  F2 + some F3.

Risk of fibrosis is Moderate.

Follow-up: Additional testing appropriate

## 2018-10-14 ENCOUNTER — Ambulatory Visit (INDEPENDENT_AMBULATORY_CARE_PROVIDER_SITE_OTHER): Payer: BLUE CROSS/BLUE SHIELD | Admitting: Obstetrics and Gynecology

## 2018-10-14 ENCOUNTER — Encounter: Payer: Self-pay | Admitting: Obstetrics and Gynecology

## 2018-10-14 VITALS — BP 132/70 | Wt 195.5 lb

## 2018-10-14 DIAGNOSIS — Z3A38 38 weeks gestation of pregnancy: Secondary | ICD-10-CM

## 2018-10-14 DIAGNOSIS — O09523 Supervision of elderly multigravida, third trimester: Secondary | ICD-10-CM

## 2018-10-14 DIAGNOSIS — O0990 Supervision of high risk pregnancy, unspecified, unspecified trimester: Secondary | ICD-10-CM

## 2018-10-14 LAB — FETAL NONSTRESS TEST

## 2018-10-14 LAB — POCT URINALYSIS DIPSTICK OB: Glucose, UA: NEGATIVE

## 2018-10-14 NOTE — Progress Notes (Signed)
ROB No concerns 

## 2018-10-14 NOTE — Progress Notes (Signed)
Routine Prenatal Care Visit  Subjective  Laura Espinoza is a 42 y.o. [phone] at [redacted]w[redacted]d being seen today for ongoing prenatal care.  She is currently monitored for the following issues for this high-risk pregnancy and has Allergic rhinitis; Anxiety; Gastritis; Right knee pain; Thoracic back pain; Preventative health care; HBV (hepatitis B virus) infection; Perforated tympanic membrane; Need for hepatitis A vaccination; Asthma; Vaccine for streptococcus pneumoniae and influenza; Visit for screening mammogram; Breast mass, right; Supervision of high risk pregnancy, antepartum; Supervision of pregnancy resulting from assisted reproductive technology; Twin gestation in first trimester; Obesity affecting pregnancy; BMI 35.0-35.9,adult; History of preterm delivery, currently pregnant; Nausea and vomiting during pregnancy; AMA (advanced maternal age) multigravida 35+, third trimester; Carpal tunnel syndrome, left upper limb; Carpal tunnel syndrome, right upper limb; and Indication for care in labor and delivery, antepartum on their problem list.  ----------------------------------------------------------------------------------- Patient reports no complaints.   Contractions: Not present. Vag. Bleeding: None.  Movement: Present. Denies leaking of fluid.  ----------------------------------------------------------------------------------- The following portions of the patient's history were reviewed and updated as appropriate: allergies, current medications, past family history, past medical history, past social history, past surgical history and problem list. Problem list updated.   Objective  Blood pressure 132/70, weight 195 lb 8 oz (88.7 kg), last menstrual period 01/12/2018. Pregravid weight 181 lb (82.1 kg) Total Weight Gain 14 lb 8 oz (6.577 kg) Urinalysis:      Fetal Status: Fetal Heart Rate (bpm): 130   Movement: Present  Presentation: Vertex  General:  Alert, oriented and cooperative. Patient is  in no acute distress.  Skin: Skin is warm and dry. No rash noted.   Cardiovascular: Normal heart rate noted  Respiratory: Normal respiratory effort, no problems with respiration noted  Abdomen: Soft, gravid, appropriate for gestational age. Pain/Pressure: Absent     Pelvic:  Cervical exam performed Dilation: 1 Effacement (%): 80 Station: -3 membranes swept  Extremities: Normal range of motion.     Mental Status: Normal mood and affect. Normal behavior. Normal judgment and thought content.     Assessment   42 y.o. [id number] at [redacted]w[redacted]d by  10/26/2018, by Est. Date of Conception presenting for routine prenatal visit  Plan   Pregnancy Problems (from 03/19/18 to present)    Problem Noted Resolved   Supervision of high risk pregnancy, antepartum 03/19/2018 by Will Bonnet, MD No   Overview Addendum 09/02/2018  8:37 AM by Homero Fellers, MD    [facility] Prenatal Labs  Dating U/s confirmed ART, embryo transfer Blood type: B/Positive/-- (04/22 1036)   Genetic Screen First trimester screen: negative Antibody:Negative (04/22 1036)  Anatomic [place] complete Rubella: 29.60 (04/22 1036) Varicella: Immune  GTT Early: 71             Third trimester: 127 RPR: Non Reactive (04/22 1036)   Rhogam Not applicable HBsAg: Confirm. indicated (04/22 1036)   TDaP vaccine   08/19/2018                     Flu Shot: 09/02/18 HIV: Non Reactive (04/22 1036)   Baby Food   Breast                             GBS:   Contraception  considering nexplanon Pap: 2018 NIL, HPV negative  CBB     CS/VBAC  not applicable   Support Person  Supervision of pregnancy resulting from assisted reproductive technology 03/19/2018 by Will Bonnet, MD No   Overview Addendum 05/27/2018  4:28 PM by Homero Fellers, MD    Two blastocyst transfer on 2/28 (day of conception of 02/02/18). Twin gestational verified by ultrasound 3/29 ([redacted]w[redacted]d GA) [ ]  fetal ECHO       Twin gestation in first trimester 03/19/2018 by  Will Bonnet, MD No   Overview Addendum 03/19/2018  1:08 PM by Will Bonnet, MD    - Verify di-di twin gestation. Patient states she has ultrasound report.  - likely di-di due to two embryo transfers to obtain pregnancy  - no Pre-implantation Genetic Screening done prior to pregnancy. - embryos collected in 2014 and frozen used for this pregnancy. [ ]  start bASA after 12 weeks  Start low dose ASA at >[redacted] weeks gestation as per USPTF recommendation "Low-Dose Aspirin Use for the Prevention of Morbidity and Mortality From Preeclampsia: Preventive Medicine"  furthermore endorsed by ACOG, WHO, and NIH based on evidence level B for the prevention of preeclampsia  In women deemed high risk  (diabetes, renal disease, chronic hypertension, history of preeclampsia in prior gestation, autoimmune diseases, or multifetal gestations).  ACOG Committee Opinion 629 "Low-Dose Asprin Use During Pregnancy" June 25th 2018  High Risk (Start if 1 or more present) History of preeclampsia Multifetal Gestation Chronic HTN Type I or II DM Renal Disease Autoimmune Disease (SLE, Antiphospholipid antibody syndrome)  Moderate Risk (consider starting if more than one present) Nulliparity Obesity (BMI >30) Family history of Preeclampsia (Mother or sister) Socioeconomic characteristics (African American, low socieeconomic status) Age 79 years or older Personal history factors (low birthweight of SGA, previous adverse pregnancy outcome, more than 10 year pregnancy interval)      Obesity affecting pregnancy 03/19/2018 by Will Bonnet, MD No   Overview Signed 03/19/2018  1:03 PM by Will Bonnet, MD    [ ]  early 1h gtt [x]  Offer genetic screening      BMI 35.0-35.9,adult 03/19/2018 by Will Bonnet, MD No   History of preterm delivery, currently pregnant 03/19/2018 by Will Bonnet, MD No   Overview Signed 03/19/2018  1:02 PM by Will Bonnet, MD    - 17OHP not indicated for multiple  gestations per ACOG PB130      Nausea and vomiting during pregnancy 03/19/2018 by Will Bonnet, MD No       Gestational age appropriate obstetric precautions including but not limited to vaginal bleeding, contractions, leaking of fluid and fetal movement were reviewed in detail with the patient.    IOL scheduled for Saturday  (10/19/18)  at 9am.   No follow-ups on file.  Homero Fellers MD [facility], [facility] 10/14/2018, 10:58 AM

## 2018-10-17 ENCOUNTER — Other Ambulatory Visit: Payer: Self-pay

## 2018-10-17 ENCOUNTER — Inpatient Hospital Stay: Payer: BLUE CROSS/BLUE SHIELD | Admitting: Anesthesiology

## 2018-10-17 ENCOUNTER — Inpatient Hospital Stay
Admission: EM | Admit: 2018-10-17 | Discharge: 2018-10-19 | DRG: 806 | Disposition: A | Discharge status: Home / Self Care | Payer: BLUE CROSS/BLUE SHIELD | Attending: Obstetrics & Gynecology | Admitting: Obstetrics & Gynecology

## 2018-10-17 ENCOUNTER — Encounter: Payer: Self-pay | Admitting: *Deleted

## 2018-10-17 DIAGNOSIS — O0990 Supervision of high risk pregnancy, unspecified, unspecified trimester: Secondary | ICD-10-CM

## 2018-10-17 DIAGNOSIS — R32 Unspecified urinary incontinence: Secondary | ICD-10-CM | POA: Insufficient documentation

## 2018-10-17 DIAGNOSIS — E669 Obesity, unspecified: Secondary | ICD-10-CM | POA: Diagnosis present

## 2018-10-17 DIAGNOSIS — O09523 Supervision of elderly multigravida, third trimester: Secondary | ICD-10-CM | POA: Diagnosis present

## 2018-10-17 DIAGNOSIS — Z3483 Encounter for supervision of other normal pregnancy, third trimester: Secondary | ICD-10-CM | POA: Diagnosis not present

## 2018-10-17 DIAGNOSIS — O99214 Obesity complicating childbirth: Secondary | ICD-10-CM | POA: Diagnosis not present

## 2018-10-17 DIAGNOSIS — Z7982 Long term (current) use of aspirin: Secondary | ICD-10-CM | POA: Diagnosis not present

## 2018-10-17 DIAGNOSIS — O99213 Obesity complicating pregnancy, third trimester: Secondary | ICD-10-CM

## 2018-10-17 DIAGNOSIS — B181 Chronic viral hepatitis B without delta-agent: Secondary | ICD-10-CM | POA: Diagnosis not present

## 2018-10-17 DIAGNOSIS — O09819 Supervision of pregnancy resulting from assisted reproductive technology, unspecified trimester: Secondary | ICD-10-CM

## 2018-10-17 DIAGNOSIS — O219 Vomiting of pregnancy, unspecified: Secondary | ICD-10-CM

## 2018-10-17 DIAGNOSIS — O09899 Supervision of other high risk pregnancies, unspecified trimester: Secondary | ICD-10-CM

## 2018-10-17 DIAGNOSIS — Z3A38 38 weeks gestation of pregnancy: Secondary | ICD-10-CM

## 2018-10-17 DIAGNOSIS — O9842 Viral hepatitis complicating childbirth: Secondary | ICD-10-CM | POA: Diagnosis not present

## 2018-10-17 DIAGNOSIS — O09219 Supervision of pregnancy with history of pre-term labor, unspecified trimester: Secondary | ICD-10-CM

## 2018-10-17 DIAGNOSIS — Z6835 Body mass index (BMI) 35.0-35.9, adult: Secondary | ICD-10-CM

## 2018-10-17 DIAGNOSIS — O30041 Twin pregnancy, dichorionic/diamniotic, first trimester: Secondary | ICD-10-CM

## 2018-10-17 LAB — CBC
HCT: 41.3 % (ref 36.0–46.0)
Hemoglobin: 14.4 g/dL (ref 12.0–15.0)
MCH: 31.7 pg (ref 26.0–34.0)
MCHC: 34.9 g/dL (ref 30.0–36.0)
MCV: 91 fL (ref 80.0–100.0)
NRBC: 0 % (ref 0.0–0.2)
PLATELETS: 244 10*3/uL (ref 150–400)
RBC: 4.54 MIL/uL (ref 3.87–5.11)
RDW: 13.7 % (ref 11.5–15.5)
WBC: 11.8 10*3/uL — AB (ref 4.0–10.5)

## 2018-10-17 LAB — TYPE AND SCREEN
ABO/RH(D): B POS
Antibody Screen: NEGATIVE

## 2018-10-17 MED ORDER — BUTORPHANOL TARTRATE 2 MG/ML IJ SOLN: 1.0000 mg | INTRAMUSCULAR | Status: DC | PRN

## 2018-10-17 MED ORDER — ONDANSETRON HCL 4 MG/2ML IJ SOLN: 4.0000 mg | Freq: Four times a day (QID) | INTRAMUSCULAR | Status: DC | PRN

## 2018-10-17 MED ORDER — LACTATED RINGERS IV SOLN: 500.0000 mL | INTRAVENOUS | Status: DC | PRN

## 2018-10-17 MED ORDER — OXYTOCIN 40 UNITS IN LACTATED RINGERS INFUSION - SIMPLE MED
2.5000 [IU]/h | INTRAVENOUS | Status: DC
  Filled 2018-10-17: qty 1000

## 2018-10-17 MED ORDER — OXYTOCIN BOLUS FROM INFUSION
500.0000 mL | Freq: Once | INTRAVENOUS | Status: AC
  Administered 2018-10-18: 500 mL via INTRAVENOUS

## 2018-10-17 MED ORDER — PHENYLEPHRINE 40 MCG/ML (10ML) SYRINGE FOR IV PUSH (FOR BLOOD PRESSURE SUPPORT): 80.0000 ug | PREFILLED_SYRINGE | INTRAVENOUS | Status: DC | PRN

## 2018-10-17 MED ORDER — DIPHENHYDRAMINE HCL 50 MG/ML IJ SOLN: 12.5000 mg | INTRAMUSCULAR | Status: DC | PRN

## 2018-10-17 MED ORDER — EPHEDRINE 5 MG/ML INJ: 10.0000 mg | INTRAVENOUS | Status: DC | PRN

## 2018-10-17 MED ORDER — LACTATED RINGERS IV SOLN
INTRAVENOUS | Status: DC
  Administered 2018-10-17: 20:00:00 via INTRAVENOUS

## 2018-10-17 MED ORDER — LIDOCAINE-EPINEPHRINE (PF) 1.5 %-1:200000 IJ SOLN
INTRAMUSCULAR | Status: DC | PRN
  Administered 2018-10-17: 3 mL via PERINEURAL

## 2018-10-17 MED ORDER — LIDOCAINE HCL (PF) 1 % IJ SOLN
INTRAMUSCULAR | Status: DC | PRN
  Administered 2018-10-17: 3 mL

## 2018-10-17 MED ORDER — OXYTOCIN 40 UNITS IN LACTATED RINGERS INFUSION - SIMPLE MED
2.5000 [IU]/h | INTRAVENOUS | Status: DC
  Administered 2018-10-18: 2.5 [IU]/h via INTRAVENOUS

## 2018-10-17 MED ORDER — LACTATED RINGERS IV SOLN
500.0000 mL | Freq: Once | INTRAVENOUS | Status: AC
  Administered 2018-10-17: 200 mL via INTRAVENOUS

## 2018-10-17 MED ORDER — FENTANYL 2.5 MCG/ML W/ROPIVACAINE 0.15% IN NS 100 ML EPIDURAL (ARMC)
EPIDURAL | Status: AC
  Filled 2018-10-17: qty 100

## 2018-10-17 MED ORDER — FENTANYL 2.5 MCG/ML W/ROPIVACAINE 0.15% IN NS 100 ML EPIDURAL (ARMC)
12.0000 mL/h | EPIDURAL | Status: DC
  Administered 2018-10-17: 12 mL/h via EPIDURAL

## 2018-10-17 MED ORDER — LACTATED RINGERS IV SOLN: INTRAVENOUS | Status: DC

## 2018-10-17 MED ORDER — ACETAMINOPHEN 325 MG PO TABS: 650.0000 mg | ORAL_TABLET | ORAL | Status: DC | PRN

## 2018-10-17 MED ORDER — BUPIVACAINE HCL (PF) 0.25 % IJ SOLN
INTRAMUSCULAR | Status: DC | PRN
  Administered 2018-10-17: 10 mL via EPIDURAL

## 2018-10-17 NOTE — Anesthesia Procedure Notes (Signed)
Epidural Patient location during procedure: OB Start time: 10/17/2018 8:54 PM End time: 10/17/2018 9:09 PM  Staffing Anesthesiologist: Alvin Critchley, MD Performed: anesthesiologist   Preanesthetic Checklist Completed: patient identified, site marked, surgical consent, pre-op evaluation, timeout performed, IV checked, risks and benefits discussed and monitors and equipment checked  Epidural Patient position: sitting Prep: Betadine Patient monitoring: heart rate, continuous pulse ox and blood pressure Approach: midline Location: L3-L4 Injection technique: LOR air  Needle:  Needle type: Tuohy  Needle gauge: 17 G Needle length: 9 cm and 9 Catheter type: closed end flexible Catheter size: 19 Gauge Test dose: negative and 1.5% lidocaine with Epi 1:200 K  Assessment Sensory level: T8 Events: blood not aspirated, injection not painful, no injection resistance, negative IV test and no paresthesia  Additional Notes Patient placed in sitting position.  Back prepped and draped in sterile fashion.  Patient cautioned to not be so mobile.  A skin wheal was made in the L3-L4 interspace with 1% Lidocaine plain.  A 17G Tuohy needle was advanced to the epidural space by a loss of resistance technique.  The epidural catheter was threaded 3 cm into the epidural space  And The TD was negative.  No blood, fluid or paresthesias.  Patient tolerated the procedure well.  The catheter was affixed to the back in sterile fashion.Reason for block:procedure for pain

## 2018-10-17 NOTE — Discharge Instructions (Signed)
Vaginal Delivery, Care After °Refer to this sheet in the next few weeks. These instructions provide you with information about caring for yourself after vaginal delivery. Your health care provider may also give you more specific instructions. Your treatment has been planned according to current medical practices, but problems sometimes occur. Call your health care provider if you have any problems or questions. °What can I expect after the procedure? °After vaginal delivery, it is common to have: °· Some bleeding from your vagina. °· Soreness in your abdomen, your vagina, and the area of skin between your vaginal opening and your anus (perineum). °· Pelvic cramps. °· Fatigue. ° °Follow these instructions at home: °Medicines °· Take over-the-counter and prescription medicines only as told by your health care provider. °· If you were prescribed an antibiotic medicine, take it as told by your health care provider. Do not stop taking the antibiotic until it is finished. °Driving ° °· Do not drive or operate heavy machinery while taking prescription pain medicine. °· Do not drive for 24 hours if you received a sedative. °Lifestyle °· Do not drink alcohol. This is especially important if you are breastfeeding or taking medicine to relieve pain. °· Do not use tobacco products, including cigarettes, chewing tobacco, or e-cigarettes. If you need help quitting, ask your health care provider. °Eating and drinking °· Drink at least 8 eight-ounce glasses of water every day unless you are told not to by your health care provider. If you choose to breastfeed your baby, you may need to drink more water than this. °· Eat high-fiber foods every day. These foods may help prevent or relieve constipation. High-fiber foods include: °? Whole grain cereals and breads. °? Brown rice. °? Beans. °? Fresh fruits and vegetables. °Activity °· Return to your normal activities as told by your health care provider. Ask your health care provider  what activities are safe for you. °· Rest as much as possible. Try to rest or take a nap when your baby is sleeping. °· Do not lift anything that is heavier than your baby or 10 lb (4.5 kg) until your health care provider says that it is safe. °· Talk with your health care provider about when you can engage in sexual activity. This may depend on your: °? Risk of infection. °? Rate of healing. °? Comfort and desire to engage in sexual activity. °Vaginal Care °· If you have an episiotomy or a vaginal tear, check the area every day for signs of infection. Check for: °? More redness, swelling, or pain. °? More fluid or blood. °? Warmth. °? Pus or a bad smell. °· Do not use tampons or douches until your health care provider says this is safe. °· Watch for any blood clots that may pass from your vagina. These may look like clumps of dark red, brown, or black discharge. °General instructions °· Keep your perineum clean and dry as told by your health care provider. °· Wear loose, comfortable clothing. °· Wipe from front to back when you use the toilet. °· Ask your health care provider if you can shower or take a bath. If you had an episiotomy or a perineal tear during labor and delivery, your health care provider may tell you not to take baths for a certain length of time. °· Wear a bra that supports your breasts and fits you well. °· If possible, have someone help you with household activities and help care for your baby for at least a few days after   you leave the hospital. °· Keep all follow-up visits for you and your baby as told by your health care provider. This is important. °Contact a health care provider if: °· You have: °? Vaginal discharge that has a bad smell. °? Difficulty urinating. °? Pain when urinating. °? A sudden increase or decrease in the frequency of your bowel movements. °? More redness, swelling, or pain around your episiotomy or vaginal tear. °? More fluid or blood coming from your episiotomy or  vaginal tear. °? Pus or a bad smell coming from your episiotomy or vaginal tear. °? A fever. °? A rash. °? Little or no interest in activities you used to enjoy. °? Questions about caring for yourself or your baby. °· Your episiotomy or vaginal tear feels warm to the touch. °· Your episiotomy or vaginal tear is separating or does not appear to be healing. °· Your breasts are painful, hard, or turn red. °· You feel unusually sad or worried. °· You feel nauseous or you vomit. °· You pass large blood clots from your vagina. If you pass a blood clot from your vagina, save it to show to your health care provider. Do not flush blood clots down the toilet without having your health care provider look at them. °· You urinate more than usual. °· You are dizzy or light-headed. °· You have not breastfed at all and you have not had a menstrual period for 12 weeks after delivery. °· You have stopped breastfeeding and you have not had a menstrual period for 12 weeks after you stopped breastfeeding. °Get help right away if: °· You have: °? Pain that does not go away or does not get better with medicine. °? Chest pain. °? Difficulty breathing. °? Blurred vision or spots in your vision. °? Thoughts about hurting yourself or your baby. °· You develop pain in your abdomen or in one of your legs. °· You develop a severe headache. °· You faint. °· You bleed from your vagina so much that you fill two sanitary pads in one hour. °This information is not intended to replace advice given to you by your health care provider. Make sure you discuss any questions you have with your health care provider. °Document Released: 11/24/2000 Document Revised: 05/10/2016 Document Reviewed: 12/12/2015 °Elsevier Interactive Patient Education © 2018 [employer] Inc[employer] ° °

## 2018-10-17 NOTE — H&P (Signed)
Obstetrics Admission History & Physical     HPI:  42 y.o. [id number] @ [redacted]w[redacted]d (10/26/2018, by Est. Date of Conception). Admitted on 10/17/2018:    Presents for LOF at 1620 and irreg painful ctxs ever since.  Bloody fluid since arriving here.     [facility] at: at [facility]. Pregnancy complicated by HBV carrier status, ART pregnancy, obesity affecting pregnancy, history of PTL/PTD, and vanishing twin this pregnancy..  ROS: A review of systems was performed and negative, except as stated in the above HPI. PMHx:  Past Medical History:  Diagnosis Date  . Allergic rhinitis   . Anxiety   . Breast mass, right 11/11/2017   Nov 09, 2017, add'l views ordered  . Chest pain    a. 12/2015 ETT: Ex time: 8:00, Max HR 169 (92%), BP 201/71, METS 10.1, No ecg changes.  Marland Kitchen Dyspnea on exertion    a. 12/2015 Echo: EF 60-65%, no rwma.  . Gastritis   . Hepatitis B   . OM (otitis media), recurrent    as a child; ruptured TM  . Pneumonia 2007   hospitalized for possible pneumonia in [place] for 2 weeks  . Recurrent sinusitis    PSHx:  Past Surgical History:  Procedure Laterality Date  . fallopian tube removal Left March 2014  . INNER EAR SURGERY    . LAPAROSCOPIC OVARIAN CYSTECTOMY  2008   in [place]  . REFRACTIVE SURGERY Bilateral 1998  . TYMPANOPLASTY WITH GRAFT Left 2017   [place], Dr. Ned Grace   Medications:  Facility-Administered Medications Prior to Admission  Medication Dose Route Frequency Provider Last Rate Last Dose  . hydroxyprogesterone caproate (MAKENA) 250 mg/mL injection 250 mg  250 mg Intramuscular Once Schuman, Christanna R, MD       Medications Prior to Admission  Medication Sig Dispense Refill Last Dose  . aspirin EC 81 MG tablet Take 1 tablet (81 mg total) by mouth daily. Take after 12 weeks for prevention of preeclampssia later in pregnancy 300 tablet 2 10/16/2018  . Doxylamine-Pyridoxine (DICLEGIS) 10-10 MG TBEC 2 tabs PO QHS, 1 tab PO in AM, 1 tab PO in afternoon PRN nausea 120  tablet 3 10/16/2018  . Ferrous Sulfate Dried 45 MG TBCR Take by mouth.   10/16/2018  . Prenatal Vit-Fe Fumarate-FA (PRENATAL VITAMIN PO) Take by mouth.   10/16/2018  . famotidine (PEPCID) 20 MG tablet Take by mouth.   Unknown at Unknown time  . loratadine (CLARITIN) 10 MG tablet Take 10 mg by mouth daily.   Unknown at Unknown time   Allergies: is allergic to procaine. OBHx:  OB History  Gravida Para Term Preterm AB Living  3 1 0 1 1 1   SAB TAB Ectopic Multiple Live Births  1       1    # Outcome Date GA Lbr Len/2nd Weight Sex Delivery Anes PTL Lv  3 Current           2 Preterm 2015 [redacted]w[redacted]d      Y LIV  1 SAB            MGQ:QPYPPJKD/TOIZTIWPYKDX except as detailed in HPI.Marland Kitchen  No family history of birth defects. Soc Hx: Alcohol: none and Recreational drug use: none  Objective:   Vitals:   10/17/18 1812 10/17/18 1827  BP:  (!) 141/90  Pulse:  92  Temp: 98.2 F (36.8 C)    Constitutional: Well nourished, well developed female in no acute distress.  HEENT: normal Skin: Warm and dry.  Cardiovascular:Regular rate  and rhythm.   Extremity: trace to 1+ bilateral pedal edema Respiratory: Clear to auscultation bilateral. Normal respiratory effort Abdomen: gravid ND NT Back: no CVAT Neuro: DTRs 2+, Cranial nerves grossly intact Psych: Alert and Oriented x3. No memory deficits. Normal mood and affect.  MS: normal gait, normal bilateral lower extremity ROM/strength/stability.  Pelvic exam: is not limited by body habitus EGBUS: within normal limits Vagina: within normal limits and with copious amounts of clear to bloody fluid blood in the vault Cervix: 5/90/-3 Uterus: Spontaneous uterine activity  Adnexa: not evaluated  EFM:FHR:   140 bpm, variability: moderate,  accelerations:  Present,  decelerations:  Absent Toco: Frequency: Every 10 minutes   Perinatal info:  Blood type: B positive Rubella- Immune Varicella -Immune TDaP Given during third trimester of this pregnancy RPR NR /  HIV Neg/ HBsAg Neg   Assessment & Plan:   42 y.o. [id number] @ [redacted]w[redacted]d, Admitted on 10/17/2018:Active labor    Admit for labor, Fetal Wellbeing Reassuring and Epidural when ready [facility] Prenatal Labs  Dating U/s confirmed ART, embryo transfer Blood type: B/Positive/-- (04/22 1036)   Genetic Screen First trimester screen: negative Antibody:Negative (04/22 1036)  Anatomic [place] complete Rubella: 29.60 (04/22 1036) Varicella: Immune  GTT Early: 27             Third trimester: 127 RPR: Non Reactive (04/22 1036)   Rhogam Not applicable HBsAg: Confirm. indicated (04/22 1036)   TDaP vaccine   08/19/2018                     Flu Shot: 09/02/18 HIV: Non Reactive (04/22 1036)   Baby Food   Breast                             GBS: NEG  Contraception  considering nexplanon Pap: 2018 NIL, HPV negative  CBB  no   CS/VBAC  not applicable    Barnett Applebaum, MD, [place], [facility] 10/17/2018  7:30 PM

## 2018-10-17 NOTE — Anesthesia Preprocedure Evaluation (Signed)
Anesthesia Evaluation  Patient identified by MRN, date of birth, ID band Patient awake    Reviewed: Allergy & Precautions, NPO status , Patient's Chart, lab work & pertinent test results  Airway Mallampati: II  TM Distance: >3 FB     Dental   Pulmonary asthma , pneumonia, resolved,    Pulmonary exam normal        Cardiovascular negative cardio ROS Normal cardiovascular exam     Neuro/Psych Anxiety  Neuromuscular disease    GI/Hepatic negative GI ROS, (+) Hepatitis -, B  Endo/Other  negative endocrine ROS  Renal/GU negative Renal ROS  negative genitourinary   Musculoskeletal   Abdominal Normal abdominal exam  (+)   Peds negative pediatric ROS (+)  Hematology negative hematology ROS (+)   Anesthesia Other Findings Past Medical History: No date: Allergic rhinitis No date: Anxiety 11/11/2017: Breast mass, right     Comment:  Nov 09, 2017, add'l views ordered No date: Chest pain     Comment:  a. 12/2015 ETT: Ex time: 8:00, Max HR 169 (92%), BP               201/71, METS 10.1, No ecg changes. No date: Dyspnea on exertion     Comment:  a. 12/2015 Echo: EF 60-65%, no rwma. No date: Gastritis No date: Hepatitis B No date: OM (otitis media), recurrent     Comment:  as a child; ruptured TM 2007: Pneumonia     Comment:  hospitalized for possible pneumonia in [place] for 2               weeks No date: Recurrent sinusitis  Reproductive/Obstetrics (+) Pregnancy                             Anesthesia Physical Anesthesia Plan  ASA: II  Anesthesia Plan: Epidural   Post-op Pain Management:    Induction:   PONV Risk Score and Plan:   Airway Management Planned: Natural Airway  Additional Equipment:   Intra-op Plan:   Post-operative Plan:   Informed Consent: I have reviewed the patients History and Physical, chart, labs and discussed the procedure including the risks, benefits and  alternatives for the proposed anesthesia with the patient or authorized representative who has indicated his/her understanding and acceptance.   Dental advisory given  Plan Discussed with: CRNA and Surgeon  Anesthesia Plan Comments:         Anesthesia Quick Evaluation

## 2018-10-18 DIAGNOSIS — Z3A38 38 weeks gestation of pregnancy: Secondary | ICD-10-CM

## 2018-10-18 LAB — CBC
HCT: 33.8 % — ABNORMAL LOW (ref 36.0–46.0)
Hemoglobin: 11.8 g/dL — ABNORMAL LOW (ref 12.0–15.0)
MCH: 32.2 pg (ref 26.0–34.0)
MCHC: 34.9 g/dL (ref 30.0–36.0)
MCV: 92.1 fL (ref 80.0–100.0)
Platelets: 220 10*3/uL (ref 150–400)
RBC: 3.67 MIL/uL — ABNORMAL LOW (ref 3.87–5.11)
RDW: 14 % (ref 11.5–15.5)
WBC: 16.7 10*3/uL — ABNORMAL HIGH (ref 4.0–10.5)
nRBC: 0 % (ref 0.0–0.2)

## 2018-10-18 MED ORDER — IBUPROFEN 600 MG PO TABS
600.0000 mg | ORAL_TABLET | Freq: Four times a day (QID) | ORAL | Status: DC
  Administered 2018-10-18 – 2018-10-19 (×6): 600 mg via ORAL
  Filled 2018-10-18 (×6): qty 1

## 2018-10-18 MED ORDER — ACETAMINOPHEN 325 MG PO TABS: 650.0000 mg | ORAL_TABLET | ORAL | Status: DC | PRN

## 2018-10-18 MED ORDER — DIPHENHYDRAMINE HCL 25 MG PO CAPS: 25.0000 mg | ORAL_CAPSULE | Freq: Four times a day (QID) | ORAL | Status: DC | PRN

## 2018-10-18 MED ORDER — DIBUCAINE 1 % RE OINT
1.0000 "application " | TOPICAL_OINTMENT | RECTAL | Status: DC | PRN
  Filled 2018-10-18: qty 28

## 2018-10-18 MED ORDER — COCONUT OIL OIL
1.0000 "application " | TOPICAL_OIL | Status: DC | PRN
  Administered 2018-10-18: 1 via TOPICAL
  Filled 2018-10-18: qty 120

## 2018-10-18 MED ORDER — SODIUM CHLORIDE 0.9% FLUSH: 3.0000 mL | Freq: Two times a day (BID) | INTRAVENOUS | Status: DC

## 2018-10-18 MED ORDER — SENNOSIDES-DOCUSATE SODIUM 8.6-50 MG PO TABS
2.0000 | ORAL_TABLET | ORAL | Status: DC
  Filled 2018-10-18: qty 2

## 2018-10-18 MED ORDER — SODIUM CHLORIDE 0.9% FLUSH: 3.0000 mL | INTRAVENOUS | Status: DC | PRN

## 2018-10-18 MED ORDER — ONDANSETRON HCL 4 MG PO TABS: 4.0000 mg | ORAL_TABLET | ORAL | Status: DC | PRN

## 2018-10-18 MED ORDER — WITCH HAZEL-GLYCERIN EX PADS: 1.0000 "application " | MEDICATED_PAD | CUTANEOUS | Status: DC | PRN

## 2018-10-18 MED ORDER — OXYCODONE-ACETAMINOPHEN 5-325 MG PO TABS: 1.0000 | ORAL_TABLET | ORAL | Status: DC | PRN

## 2018-10-18 MED ORDER — BENZOCAINE-MENTHOL 20-0.5 % EX AERO
1.0000 "application " | INHALATION_SPRAY | CUTANEOUS | Status: DC | PRN
  Filled 2018-10-18: qty 56

## 2018-10-18 MED ORDER — SODIUM CHLORIDE 0.9 % IV SOLN: 250.0000 mL | INTRAVENOUS | Status: DC | PRN

## 2018-10-18 MED ORDER — ZOLPIDEM TARTRATE 5 MG PO TABS: 5.0000 mg | ORAL_TABLET | Freq: Every evening | ORAL | Status: DC | PRN

## 2018-10-18 MED ORDER — ONDANSETRON HCL 4 MG/2ML IJ SOLN: 4.0000 mg | INTRAMUSCULAR | Status: DC | PRN

## 2018-10-18 MED ORDER — SIMETHICONE 80 MG PO CHEW: 80.0000 mg | CHEWABLE_TABLET | ORAL | Status: DC | PRN

## 2018-10-18 MED ORDER — OXYCODONE-ACETAMINOPHEN 5-325 MG PO TABS: 2.0000 | ORAL_TABLET | ORAL | Status: DC | PRN

## 2018-10-18 NOTE — Progress Notes (Signed)
Post Partum Day 0-8 hours postpartum Subjective: up ad lib, voiding, tolerating PO and breast feeding. She had some concerns regarding amount of bleeding she was having (saturated the pad in 4 hours) but bleeding seems to be slowing.   Objective: Blood pressure 133/82, pulse (!) 101, temperature 97.9 F (36.6 C), temperature source Oral, resp. rate 18, height 5\' 1"  (1.549 m), weight 88.9 kg, last menstrual period 01/12/2018, SpO2 98 %, unknown if currently breastfeeding.  Physical Exam:  General: alert, cooperative and no distress Lochia: appropriate Uterine Fundus: firm/ U-1/ML/NT  DVT Evaluation: No evidence of DVT seen on physical exam.  Recent Labs    10/17/18 1938 10/18/18 0459  HGB 14.4 11.8*  HCT 41.3 33.8*  WBC 11.8* 16.7*  PLT 244 220    Assessment/Plan: Stable-day of delivery Continue postpartum care B pos/ RI/ VI Breast Nexplanon TDAP and flu vaccine-given AP Possible discharge tomorrow  LOS: 1 day   Dalia Heading 10/18/2018, 9:34 AM

## 2018-10-18 NOTE — Discharge Summary (Signed)
OB Discharge Summary     Patient Name: Laura Espinoza DOB: Oct 19, 1976 MRN: [id number]  Date of admission: 10/17/2018 Delivering MD: Hoyt Koch, MD  Date of Delivery: 10/18/2018  Date of discharge: 10/19/2018  Admitting diagnosis: [redacted] wks pregnant contractions Intrauterine pregnancy: [redacted]w[redacted]d     Secondary diagnosis: AMA, ART pregnancy, Obesity, Hep B Carrier     Discharge diagnosis: Term Pregnancy Delivered, as above.                          Hospital course:  Onset of Labor With Vaginal Delivery     42 y.o. yo [id number] at [redacted]w[redacted]d was admitted in Active Labor on 10/17/2018. Patient had an uncomplicated labor course as follows:  Membrane Rupture Time/Date: 4:20 PM ,10/17/2018   Intrapartum Procedures: Episiotomy: None [1]                                         Lacerations:    Second degree Patient had a delivery of a Viable infant. 10/18/2018  Information for the patient's newborn:  Dezyrae, Kensinger [[id number]]  Delivery Method: Vag-Vacuum    Outlet extraction due to maternal exhaustion, >3 hours second stage    Kiwi Vacuum used, delivery on second ctx/pull Pateint had an uncomplicated postpartum course.  She is ambulating, tolerating a regular diet, passing flatus, and urinating well. Patient is discharged home in stable condition on 10/19/18.                                                                  Post partum procedures:none  Complications: [id number]  Physical exam on 10/19/2018: Vitals:   10/18/18 1132 10/18/18 1551 10/18/18 2342 10/19/18 0741  BP: 128/78 125/64 127/67 122/81  Pulse: (!) 118 (!) 110 100 98  Resp: 18 18 18 18   Temp: (!) 97.3 F (36.3 C) 98 F (36.7 C) 98.1 F (36.7 C) 98.8 F (37.1 C)  TempSrc: Oral Oral Oral Oral  SpO2: 99% 99% 98% 99%  Weight:      Height:       General: alert, cooperative and no distress Lochia: appropriate Uterine Fundus: firm Incision: Healing well with no significant drainage DVT Evaluation: No evidence of DVT  seen on physical exam.  Labs: Lab Results  Component Value Date   WBC 16.7 (H) 10/18/2018   HGB 11.8 (L) 10/18/2018   HCT 33.8 (L) 10/18/2018   MCV 92.1 10/18/2018   PLT 220 10/18/2018   CMP Latest Ref Rng & Units 03/16/2016  Glucose 65 - 99 mg/dL -  BUN 6 - 20 mg/dL -  Creatinine 0.57 - 1.00 mg/dL -  Sodium 136 - 144 mmol/L -  Potassium 3.5 - 5.2 mmol/L -  Chloride 97 - 106 mmol/L -  CO2 18 - 29 mmol/L -  Calcium 8.7 - 10.2 mg/dL -  Total Protein 6.0 - 8.5 g/dL 7.5  Total Bilirubin 0.0 - 1.2 mg/dL 0.5  Alkaline Phos 39 - 117 IU/L 71  AST 0 - 40 IU/L 20  ALT 0 - 32 IU/L 19    Discharge instruction: per After Visit Summary.  Medications:  Allergies as of  10/19/2018      Reactions   Procaine Anaphylaxis, Shortness Of Breath      Medication List    STOP taking these medications   Doxylamine-Pyridoxine 10-10 MG Tbec     TAKE these medications   aspirin EC 81 MG tablet Take 1 tablet (81 mg total) by mouth daily. Take after 12 weeks for prevention of preeclampssia later in pregnancy   famotidine 20 MG tablet Commonly known as:  PEPCID Take by mouth.   Ferrous Sulfate Dried 45 MG Tbcr Take by mouth.   ibuprofen 600 MG tablet Commonly known as:  ADVIL,MOTRIN Take 1 tablet (600 mg total) by mouth every 6 (six) hours.   loratadine 10 MG tablet Commonly known as:  CLARITIN Take 10 mg by mouth daily.   PRENATAL VITAMIN PO Take by mouth.            Discharge Care Instructions  (From admission, onward)         Start     Ordered   10/19/18 0000  Discharge wound care:    Comments:  SHOWER DAILY Wash incision gently with soap and water.  Call office with any drainage, redness, or firmness of the incision.   10/19/18 0907          Diet: routine diet  Activity: Advance as tolerated. Pelvic rest for 6 weeks.   Outpatient follow up: Follow-up Information    Gae Dry, MD. Schedule an appointment as soon as possible for a visit in 6 week(s).    Specialty:  Obstetrics and Gynecology Why:  For Post Partum Check Up Contact information: [address] [place] [address] [phone]             Postpartum contraception: Nexplanon Rhogam Given postpartum: no Rubella vaccine given postpartum: no Varicella vaccine given postpartum: no TDaP given antepartum or postpartum: Yes  Newborn Data: Live born female  Birth Weight:   APGAR: 7, 9  Newborn Delivery   Birth date/time:  10/18/2018 01:25:00 Delivery type:  Vaginal, Vacuum (Extractor)     Baby Feeding: Breast  Disposition:home with mother  SIGNED: Homero Fellers, MD 10/19/2018 9:09 AM

## 2018-10-18 NOTE — Lactation Note (Signed)
This note was copied from a baby's chart. Lactation Consultation Note  Patient Name: Laura Espinoza [id number] Date: 10/18/2018 Reason for consult: Initial assessment   Maternal Data Formula Feeding for Exclusion: No Does the patient have breastfeeding experience prior to this delivery?: Yes  Feeding Feeding Type: Breast Fed  LATCH Score Latch: Grasps breast easily, tongue down, lips flanged, rhythmical sucking.  Audible Swallowing: A few with stimulation  Type of Nipple: Everted at rest and after stimulation  Comfort (Breast/Nipple): Soft / non-tender  Hold (Positioning): No assistance needed to correctly position infant at breast.  LATCH Score: 9  Interventions Interventions: Breast feeding basics reviewed;Support pillows;Position options  Lactation Tools Discussed/Used WIC Program: No   Consult Status Consult Status: PRN Baby had breastfed 1 hr earlier but wanted to observe a feeding, and baby was showing cues   Ferol Luz 10/18/2018, 2:13 PM

## 2018-10-19 LAB — RPR: RPR: NONREACTIVE

## 2018-10-19 MED ORDER — IBUPROFEN 600 MG PO TABS: 600.0000 mg | ORAL_TABLET | Freq: Four times a day (QID) | ORAL | 1 refills | Status: DC

## 2018-10-19 NOTE — Progress Notes (Signed)
Admit Date: 10/17/2018 Today's Date: 10/19/2018  Post Partum Day 1  Subjective:  no complaints, up ad lib, voiding, tolerating PO, + flatus and bowel movement  Objective: Temp:  [97.3 F (36.3 C)-98.8 F (37.1 C)] 98.8 F (37.1 C) (11/09 0741) Pulse Rate:  [98-118] 98 (11/09 0741) Resp:  [18] 18 (11/09 0741) BP: (122-128)/(64-81) 122/81 (11/09 0741) SpO2:  [98 %-99 %] 99 % (11/09 0741)  Physical Exam:  General: alert, cooperative and appears stated age Lochia: appropriate Uterine Fundus: firm Incision: none DVT Evaluation: No evidence of DVT seen on physical exam. Foot and ankle edema 2+, no erythema  Recent Labs    10/17/18 1938 10/18/18 0459  HGB 14.4 11.8*  HCT 41.3 33.8*    Assessment/Plan: Discharge home, Breastfeeding and Infant doing well  B+, rubella and varicella immune.  Planning for nexplanon postpartum.  Tdap and Flu up to date.  Will give belly binder and TED hoses.    LOS: 2 days   [place] 10/19/2018, 8:50 AM

## 2018-10-19 NOTE — Progress Notes (Signed)
Pt discharged with infant.  Discharge instructions, prescriptions and follow up appointment given to and reviewed with pt. Pt verbalized understanding. Escorted out by staff. 

## 2018-10-22 NOTE — Anesthesia Postprocedure Evaluation (Signed)
Anesthesia Post Note  Patient: Laura Espinoza  Procedure(s) Performed: AN AD HOC LABOR EPIDURAL  Patient location during evaluation: Mother Baby Anesthesia Type: Epidural Level of consciousness: awake and alert and oriented Pain management: pain level controlled Vital Signs Assessment: post-procedure vital signs reviewed and stable Respiratory status: spontaneous breathing Cardiovascular status: blood pressure returned to baseline Anesthetic complications: no Comments: Patient discharged prior to visit.  No apparent anesthetic issues according to staff     Last Vitals: There were no vitals filed for this visit.  Last Pain: There were no vitals filed for this visit.               Netha Dafoe

## 2018-10-24 ENCOUNTER — Encounter: Payer: Self-pay | Admitting: *Deleted

## 2018-10-24 ENCOUNTER — Emergency Department: Payer: BLUE CROSS/BLUE SHIELD

## 2018-10-24 ENCOUNTER — Other Ambulatory Visit: Payer: Self-pay

## 2018-10-24 ENCOUNTER — Emergency Department
Admission: EM | Admit: 2018-10-24 | Discharge: 2018-10-25 | Disposition: A | Discharge status: Home / Self Care | Payer: BLUE CROSS/BLUE SHIELD | Attending: Emergency Medicine | Admitting: Emergency Medicine

## 2018-10-24 DIAGNOSIS — J45909 Unspecified asthma, uncomplicated: Secondary | ICD-10-CM | POA: Diagnosis not present

## 2018-10-24 DIAGNOSIS — O8622 Infection of bladder following delivery: Secondary | ICD-10-CM | POA: Diagnosis not present

## 2018-10-24 DIAGNOSIS — R102 Pelvic and perineal pain: Secondary | ICD-10-CM | POA: Insufficient documentation

## 2018-10-24 DIAGNOSIS — N3000 Acute cystitis without hematuria: Secondary | ICD-10-CM | POA: Diagnosis not present

## 2018-10-24 DIAGNOSIS — N939 Abnormal uterine and vaginal bleeding, unspecified: Secondary | ICD-10-CM

## 2018-10-24 DIAGNOSIS — Z79899 Other long term (current) drug therapy: Secondary | ICD-10-CM | POA: Insufficient documentation

## 2018-10-24 LAB — COMPREHENSIVE METABOLIC PANEL
ALK PHOS: 125 U/L (ref 38–126)
ALT: 35 U/L (ref 0–44)
ANION GAP: 8 (ref 5–15)
AST: 40 U/L (ref 15–41)
Albumin: 3 g/dL — ABNORMAL LOW (ref 3.5–5.0)
BUN: 17 mg/dL (ref 6–20)
CALCIUM: 8.7 mg/dL — AB (ref 8.9–10.3)
CO2: 21 mmol/L — ABNORMAL LOW (ref 22–32)
CREATININE: 0.51 mg/dL (ref 0.44–1.00)
Chloride: 112 mmol/L — ABNORMAL HIGH (ref 98–111)
GFR calc Af Amer: >60 mL/min (ref >60)
Glucose, Bld: 117 mg/dL — ABNORMAL HIGH (ref 70–99)
Potassium: 3.6 mmol/L (ref 3.5–5.1)
Sodium: 141 mmol/L (ref 135–145)
Total Bilirubin: 0.5 mg/dL (ref 0.3–1.2)
Total Protein: 5.8 g/dL — ABNORMAL LOW (ref 6.5–8.1)

## 2018-10-24 LAB — CBC
HEMATOCRIT: 30.8 % — AB (ref 36.0–46.0)
Hemoglobin: 10.4 g/dL — ABNORMAL LOW (ref 12.0–15.0)
MCH: 31.7 pg (ref 26.0–34.0)
MCHC: 33.8 g/dL (ref 30.0–36.0)
MCV: 93.9 fL (ref 80.0–100.0)
NRBC: 0 % (ref 0.0–0.2)
Platelets: 345 10*3/uL (ref 150–400)
RBC: 3.28 MIL/uL — ABNORMAL LOW (ref 3.87–5.11)
RDW: 13.5 % (ref 11.5–15.5)
WBC: 9 10*3/uL (ref 4.0–10.5)

## 2018-10-24 LAB — I-STAT BETA HCG BLOOD, ED (NOT ORDERABLE): HCG, QUANTITATIVE: 44.2 m[IU]/mL — AB (ref <5)

## 2018-10-24 NOTE — ED Notes (Signed)
Patient transported to Ultrasound at this time. 

## 2018-10-24 NOTE — ED Triage Notes (Signed)
Pt G3P2A1 post-partum 09/17/18. Pt had difficulty after giving birth w/ possibility of retained products of conception. Pt has been bleeding heavily since giving birth and has passed a lot of clot, tonight she passed a very large clot and was bleeding heavily. Bleeding has decreased since then and there was a scant amount of blood on her pad that was replaced after she passed the large clot tonight. Pt in no acute distress at this time. Pt c/o cramping in low back that is new and worse since passing the clot. Kenton Kingfisher, OB/GYN, [facility] delivered baby.

## 2018-10-24 NOTE — ED Provider Notes (Addendum)
[facility] Emergency Department Provider Note   First MD Initiated Contact with Patient 10/24/18 2316     (approximate)  I have reviewed the triage vital signs and the nursing notes.   HISTORY  Chief Complaint Vaginal Bleeding    HPI Laura Espinoza is a 42 y.o. female G3, P2 A1 postpartum [birth year] complicated by possible retained products of conception presents with below list of chronic medical conditions history of passing a "very large clot tonight with heavy bleeding following that is since slowed significantly.  Patient does admit to pelvic cramping.  Patient denies any fever.  Patient and husband admits to the fact that umbilical cord separated from the placenta during birth and as a result the placenta was passed and "pieces".   Past Medical History:  Diagnosis Date  . Allergic rhinitis   . Anxiety   . Breast mass, right 11/11/2017   Nov 09, 2017, add'l views ordered  . Chest pain    a. 12/2015 ETT: Ex time: 8:00, Max HR 169 (92%), BP 201/71, METS 10.1, No ecg changes.  Marland Kitchen Dyspnea on exertion    a. 12/2015 Echo: EF 60-65%, no rwma.  . Gastritis   . Hepatitis B   . OM (otitis media), recurrent    as a child; ruptured TM  . Pneumonia 2007   hospitalized for possible pneumonia in [place] for 2 weeks  . Recurrent sinusitis     Patient Active Problem List   Diagnosis Date Noted  . Urinary incontinence 10/17/2018  . Normal labor and delivery 10/17/2018  . Indication for care in labor and delivery, antepartum 10/03/2018  . Carpal tunnel syndrome, left upper limb 09/17/2018  . Carpal tunnel syndrome, right upper limb 09/17/2018  . AMA (advanced maternal age) multigravida 61+, third trimester 08/19/2018  . Supervision of high risk pregnancy, antepartum 03/19/2018  . Supervision of pregnancy resulting from assisted reproductive technology 03/19/2018  . Twin gestation in first trimester 03/19/2018  . Obesity affecting pregnancy 03/19/2018  . BMI  35.0-35.9,adult 03/19/2018  . History of preterm delivery, currently pregnant 03/19/2018  . Nausea and vomiting during pregnancy 03/19/2018  . Breast mass, right 11/11/2017  . Vaccine for streptococcus pneumoniae and influenza 09/17/2017  . Visit for screening mammogram 09/17/2017  . Need for hepatitis A vaccination 09/15/2016  . Asthma 09/15/2016  . Perforated tympanic membrane 04/09/2016  . Right knee pain 11/17/2015  . Thoracic back pain 11/17/2015  . Preventative health care 11/17/2015  . Allergic rhinitis   . Anxiety   . Gastritis   . HBV (hepatitis B virus) infection 06/04/2014    Past Surgical History:  Procedure Laterality Date  . fallopian tube removal Left March 2014  . INNER EAR SURGERY    . LAPAROSCOPIC OVARIAN CYSTECTOMY  2008   in [place]  . REFRACTIVE SURGERY Bilateral 1998  . TYMPANOPLASTY WITH GRAFT Left 2017   [place], Dr. Ned Grace    Prior to Admission medications   Medication Sig Start Date End Date Taking? Authorizing Provider  aspirin EC 81 MG tablet Take 1 tablet (81 mg total) by mouth daily. Take after 12 weeks for prevention of preeclampssia later in pregnancy 04/01/18   Adrian Prows R, MD  cephALEXin (KEFLEX) 500 MG capsule Take 1 capsule (500 mg total) by mouth 2 (two) times daily for 10 days. 10/25/18 11/04/18  Gregor Hams, MD  famotidine (PEPCID) 20 MG tablet Take by mouth.    [provider]  Ferrous Sulfate Dried 45 MG TBCR  Take by mouth.    [provider]  ibuprofen (ADVIL,MOTRIN) 600 MG tablet Take 1 tablet (600 mg total) by mouth every 6 (six) hours. 10/19/18   Schuman, Stefanie Libel, MD  loratadine (CLARITIN) 10 MG tablet Take 10 mg by mouth daily.    [provider]  Prenatal Vit-Fe Fumarate-FA (PRENATAL VITAMIN PO) Take by mouth.    [provider]    Allergies Procaine  Family History  Problem Relation Age of Onset  . Hypertension Mother   . Heart disease Mother   . CAD Mother   . Heart  attack Mother   . Arthritis Mother   . CAD Father   . Heart disease Father   . Glaucoma Father   . Cancer Father        kidney  . Hypertension Brother   . Breast cancer Paternal Aunt        late 93's- early 60's  . Cancer Maternal Grandfather        unknown  . Stroke Paternal Grandmother   . Cancer Paternal Grandfather        lung?  Marland Kitchen COPD Neg Hx   . Diabetes Neg Hx     Social History Social History   Tobacco Use  . Smoking status: Never Smoker  . Smokeless tobacco: Never Used  Substance Use Topics  . Alcohol use: Not Currently  . Drug use: No    Review of Systems Constitutional: No fever/chills Eyes: No visual changes. ENT: No sore throat. Cardiovascular: Denies chest pain. Respiratory: Denies shortness of breath. Gastrointestinal: No abdominal pain.  No nausea, no vomiting.  No diarrhea.  No constipation. Genitourinary: Negative for dysuria. Musculoskeletal: Negative for neck pain.  Negative for back pain. Integumentary: Negative for rash. Neurological: Negative for headaches, focal weakness or numbness.   ____________________________________________   PHYSICAL EXAM:  VITAL SIGNS: ED Triage Vitals  Enc Vitals Group     BP 10/24/18 2252 (!) 146/97     Pulse Rate 10/24/18 2252 87     Resp 10/24/18 2252 19     Temp 10/24/18 2252 98 F (36.7 C)     Temp Source 10/24/18 2252 Oral     SpO2 10/24/18 2246 96 %     Weight 10/24/18 2255 88.9 kg (195 lb 15.8 oz)     Height 10/24/18 2255 1.549 m (5\' 1" )     Head Circumference --      Peak Flow --      Pain Score 10/24/18 2253 2     Pain Loc --      Pain Edu? --      Excl. in [place]? --     Constitutional: Alert and oriented. Well appearing and in no acute distress. Eyes: Conjunctivae are normal.  Head: Atraumatic. Mouth/Throat: Mucous membranes are moist.  Oropharynx non-erythematous. Neck: No stridor.   Cardiovascular: Normal rate, regular rhythm. Good peripheral circulation. Grossly normal heart  sounds. Respiratory: Normal respiratory effort.  No retractions. Lungs CTAB. Gastrointestinal: Soft and nontender. No distention.   Genitourinary: Mild to moderate vaginal bleeding noted Musculoskeletal: No lower extremity tenderness nor edema. No gross deformities of extremities. Neurologic:  Normal speech and language. No gross focal neurologic deficits are appreciated.  Skin:  Skin is warm, dry and intact. No rash noted. Psychiatric: Mood and affect are normal. Speech and behavior are normal.  ____________________________________________   LABS (all labs ordered are listed, but only abnormal results are displayed)  Labs Reviewed  CBC - Abnormal; Notable for the  following components:      Result Value   RBC 3.28 (*)    Hemoglobin 10.4 (*)    HCT 30.8 (*)    All other components within normal limits  COMPREHENSIVE METABOLIC PANEL - Abnormal; Notable for the following components:   Chloride 112 (*)    CO2 21 (*)    Glucose, Bld 117 (*)    Calcium 8.7 (*)    Total Protein 5.8 (*)    Albumin 3.0 (*)    All other components within normal limits  URINALYSIS, COMPLETE (UACMP) WITH MICROSCOPIC - Abnormal; Notable for the following components:   Color, Urine AMBER (*)    APPearance CLOUDY (*)    Hgb urine dipstick LARGE (*)    Protein, ur 30 (*)    Leukocytes, UA LARGE (*)    WBC, UA >50 (*)    Bacteria, UA MANY (*)    All other components within normal limits  I-STAT BETA HCG BLOOD, ED (NOT ORDERABLE) - Abnormal; Notable for the following components:   I-stat hCG, quantitative 44.2 (*)    All other components within normal limits  I-STAT BETA HCG BLOOD, ED ([facility], [facility], [facility] ONLY)  TYPE AND SCREEN   ______________________  RADIOLOGY I, Hustler N Stark Aguinaga, personally viewed and evaluated these images (plain radiographs) as part of my medical decision making, as well as reviewing the written report by the radiologist.  ED MD interpretation: Thickened endometrium with possible  endometrial blood products without any internal flow noted on pelvic ultrasound  Official radiology report(s): US Pelvis (transabdominal Only)  Result Date: 10/25/2018 CLINICAL DATA:  Postpartum vaginal bleeding. EXAM: TRANSABDOMINAL ULTRASOUND OF PELVIS TECHNIQUE: Transabdominal ultrasound examination of the pelvis was performed including evaluation of the uterus, ovaries, adnexal regions, and pelvic cul-de-sac. COMPARISON:  None. FINDINGS: Uterus Measurements: 17 x 7.6 x 9.1 cm = volume: 610.1 mL. No fibroids or other mass visualized. Endometrium Thickness: 22.6 mm. Thickened and heterogeneous in appearance. No internal vascularity identified. Right ovary Not visualized. Left ovary Not visualized. Other findings:  No abnormal free fluid. IMPRESSION: 1. The endometrium is thickened to 22.6 mm and heterogeneous. No internal blood flow. No discrete mass. Findings are nonspecific and could indicate endometrial blood products although hypovascular retained products of conception cannot be excluded. Consider short-term follow-up pelvic ultrasound or further evaluation with pelvic MRI with and without IV contrast as clinically warranted. Electronically Signed   By: Dorise Bullion III M.D   On: 10/25/2018 00:38     Procedures   ____________________________________________   INITIAL IMPRESSION / ASSESSMENT AND PLAN / ED COURSE  As part of my medical decision making, I reviewed the following data within the electronic MEDICAL RECORD NUMBER 42 year old female presenting the emergency department with above-stated history and physical exam secondary to postpartum vaginal bleeding.  Concern for retained products and as such ultrasound was performed which revealed no evidence of retained products.  Vaginal exam showed mild vaginal bleeding.  Patient discussed with Dr. Glennon Mac OB/GYN on-call who agreed with plan for outpatient follow-up. ____________________________________________  FINAL CLINICAL IMPRESSION(S)  / ED DIAGNOSES  Final diagnoses:  Vaginal bleeding  Postpartum hemorrhage, unspecified type  Acute cystitis without hematuria     MEDICATIONS GIVEN DURING THIS VISIT:  Medications - No data to display   ED Discharge Orders         Ordered    cephALEXin (KEFLEX) 500 MG capsule  2 times daily     10/25/18 0141  Note:  This document was prepared using Dragon voice recognition software and may include unintentional dictation errors.    Gregor Hams, MD 10/25/18 4097    Gregor Hams, MD 10/25/18 [phone]

## 2018-10-25 ENCOUNTER — Telehealth: Payer: Self-pay

## 2018-10-25 ENCOUNTER — Ambulatory Visit (INDEPENDENT_AMBULATORY_CARE_PROVIDER_SITE_OTHER): Payer: BLUE CROSS/BLUE SHIELD | Admitting: Obstetrics & Gynecology

## 2018-10-25 ENCOUNTER — Encounter: Payer: Self-pay | Admitting: Obstetrics & Gynecology

## 2018-10-25 DIAGNOSIS — N939 Abnormal uterine and vaginal bleeding, unspecified: Secondary | ICD-10-CM | POA: Diagnosis not present

## 2018-10-25 LAB — URINALYSIS, COMPLETE (UACMP) WITH MICROSCOPIC
Bilirubin Urine: NEGATIVE
Glucose, UA: NEGATIVE mg/dL
KETONES UR: NEGATIVE mg/dL
NITRITE: NEGATIVE
PH: 6 (ref 5.0–8.0)
PROTEIN: 30 mg/dL — AB
SPECIFIC GRAVITY, URINE: 1.006 (ref 1.005–1.030)
WBC, UA: >50 WBC/hpf — ABNORMAL HIGH (ref 0–5)

## 2018-10-25 LAB — TYPE AND SCREEN
ABO/RH(D): B POS
Antibody Screen: NEGATIVE

## 2018-10-25 MED ORDER — METHYLERGONOVINE MALEATE 0.2 MG PO TABS: 0.2000 mg | ORAL_TABLET | ORAL | 0 refills | Status: AC

## 2018-10-25 MED ORDER — CEPHALEXIN 500 MG PO CAPS: 500.0000 mg | ORAL_CAPSULE | Freq: Two times a day (BID) | ORAL | 0 refills | Status: AC

## 2018-10-25 NOTE — Telephone Encounter (Signed)
Pt delivered 11/8 by [facility]; yesterday had heavy bleeding c a large clot.  Called after hour nurse to adv her to call EMS and be seen at [facility] ED which the pt did.  Pt was told to f/u c [place].  [phone]  Front desk to call pt and schedule c SDJ this am.

## 2018-10-25 NOTE — Telephone Encounter (Signed)
I can see her at 250 today

## 2018-10-25 NOTE — Telephone Encounter (Signed)
Pt states if she is lying down the bleeding is not too bad but when she stands and moves around the bleeding picks up.  States there may still be parts of placenta in her uterus as it came out in pieces. Should pt be seen today or is it okay for her to wait until Monday?  [phone]

## 2018-10-25 NOTE — ED Notes (Addendum)
Pt states there is one detail of incorrect information in the Triage note; pt states she delivered on 10/18/2018 (in September), and NOT on 09/17/2018 (in October) like is written the note.

## 2018-10-25 NOTE — Patient Instructions (Signed)
Methylergonovine tablets Every 4 hours for 6 doses What is this medicine? METHYLERGONOVINE (meth il er goe [employer]) is one of a group of medicines known as ergot alkaloids. It used to prevent or to treat excessive bleeding after child birth. This medicine may be used for other purposes; ask your health care provider or pharmacist if you have questions. COMMON BRAND NAME(S): Methergine What should I tell my health care provider before I take this medicine? They need to know if you have any of these conditions: -high blood pressure -infection -kidney or liver disease -an unusual or allergic reaction to methylergonovine, other medicines, foods, dyes, or preservatives -pregnant or trying to get pregnant -breast-feeding (this medicine may be used with care for up to 7 days without interfering with breast-feeding) How should I use this medicine? Take this medicine by mouth with a glass of water. Follow the directions on the prescription label. Take your doses at regular intervals. Do not take your medicine more often than directed. Do not stop taking except on the advice of your doctor or health care professional. Talk to your pediatrician regarding the use of this medicine in children. Special care may be needed. Overdosage: If you think you have taken too much of this medicine contact a poison control center or emergency room at once. NOTE: This medicine is only for you. Do not share this medicine with others. What if I miss a dose? Do not take the missed dose. Take only the next dose according to your normal schedule. Do not take double or extra doses. What may interact with this medicine? Do not take this medicine with any of the following medications: -certain antibiotics like clarithromycin, erythromycin, or troleandomycin -cocaine -grapefruit juice -imatinib -medicines for colds, flu, or breathing difficulties -medicines for fungal infections like itraconazole, ketoconazole, and  voriconazole -medicines used to induce labor -medicines used to treat migraines like almotriptan, eletriptan, frovatriptan, naratriptan, rizatriptan, sumatriptan, or zolmitriptan -midodrine -nefazodone -other ergot alkaloids like ergotamine, dihydroergotamine, ergonovine, or methysergide -some medicines for high blood pressure or chest pain -some medicines for the treatment of HIV infection or AIDS This medicine may also interact with the following medications: -clotrimazole -fluconazole -fluoxetine -fluvoxamine -zileuton This list may not describe all possible interactions. Give your health care provider a list of all the medicines, herbs, non-prescription drugs, or dietary supplements you use. Also tell them if you smoke, drink alcohol, or use illegal drugs. Some items may interact with your medicine. What should I watch for while using this medicine? Do not use tampons, have sex, or use douches until the bleeding has stopped and your doctor allows return to normal activities. Follow the instructions for your condition. What side effects may I notice from receiving this medicine? Side effects that you should report to your doctor or health care professional as soon as possible: -allergic reactions like skin rash, itching or hives, swelling of the face, lips, or tongue -breathing problems -chest pain or tightness -confusion -fast, slow, or pounding heartbeat -fever or chills -hallucinations -increased bleeding -leg or arm pain or cramps -passing tissue or large clots -seizures -swelling of hands, ankles, or feet -tingling, pain or numbness in feet or hands -unusually weak or tired -vomiting Side effects that usually do not require medical attention (report to your doctor or health care professional if they continue or are bothersome): -change in taste -diarrhea -headache -nausea -stomach cramps -temporary ringing of ears This list may not describe all possible side effects.  Call your doctor for medical  advice about side effects. You may report side effects to FDA at 1-800-FDA-1088. Where should I keep my medicine? Keep out of the reach of children. Store tablets at room temperature below 25 degrees C (77 degrees F). Protect from light. Keep container tightly closed. Throw away any unused medicine after the expiration date. NOTE: This sheet is a summary. It may not cover all possible information. If you have questions about this medicine, talk to your doctor, pharmacist, or health care provider.  2018 Elsevier/Gold Standard (2008-06-04 11:17:29)

## 2018-10-25 NOTE — Telephone Encounter (Signed)
Pt aware.

## 2018-10-25 NOTE — Progress Notes (Signed)
HPI: Patient is PP but has noticed bleeding w clots and even a mass like of tissue last night.  Seen in ER last night along w [place].  Today bleeding still present but less.  Mld pains.  PMHx: She  has a past medical history of Allergic rhinitis, Anxiety, Breast mass, right (11/11/2017), Chest pain, Dyspnea on exertion, Gastritis, Hepatitis B, OM (otitis media), recurrent, Pneumonia (2007), and Recurrent sinusitis. Also,  has a past surgical history that includes Refractive surgery (Bilateral, 1998); Laparoscopic ovarian cystectomy (2008); fallopian tube removal (Left, March 2014); Tympanoplasty with graft (Left, 2017); and Inner ear surgery., family history includes Arthritis in her mother; Breast cancer in her paternal aunt; CAD in her father and mother; Cancer in her father, maternal grandfather, and paternal grandfather; Glaucoma in her father; Heart attack in her mother; Heart disease in her father and mother; Hypertension in her brother and mother; Stroke in her paternal grandmother.,  reports that she has never smoked. She has never used smokeless tobacco. She reports that she drank alcohol. She reports that she does not use drugs.  She has a current medication list which includes the following prescription(s): aspirin ec, cephalexin, famotidine, ferrous sulfate dried, ibuprofen, loratadine, prenatal vit-fe fumarate-fa, and methylergonovine. Also, is allergic to procaine.  Review of Systems  All other systems reviewed and are negative.   Objective: BP 128/70   Pulse 84   Ht 5\' 1"  (1.549 m)   Wt 178 lb (80.7 kg)   Breastfeeding? Yes   BMI 33.63 kg/m   Physical examination Constitutional NAD, Conversant  Skin No rashes, lesions or ulceration.   Extremities: Moves all appropriately.  Normal ROM for age. No lymphadenopathy.  Neuro: Grossly intact  Psych: Oriented to PPT.  Normal mood. Normal affect.       Pelvic- no ext lesions                     Vagina, scant blood, no lacerations                 Cervix closed, no lacerations                     Uterus globular, firm                     No adnexal mass  US Pelvis (transabdominal Only)  Result Date: 10/25/2018 CLINICAL DATA:  Postpartum vaginal bleeding. EXAM: TRANSABDOMINAL ULTRASOUND OF PELVIS TECHNIQUE: Transabdominal ultrasound examination of the pelvis was performed including evaluation of the uterus, ovaries, adnexal regions, and pelvic cul-de-sac. COMPARISON:  None. FINDINGS: Uterus Measurements: 17 x 7.6 x 9.1 cm = volume: 610.1 mL. No fibroids or other mass visualized. Endometrium Thickness: 22.6 mm. Thickened and heterogeneous in appearance. No internal vascularity identified. Right ovary Not visualized. Left ovary Not visualized. Other findings:  No abnormal free fluid. IMPRESSION: 1. The endometrium is thickened to 22.6 mm and heterogeneous. No internal blood flow. No discrete mass. Findings are nonspecific and could indicate endometrial blood products although hypovascular retained products of conception cannot be excluded. Consider short-term follow-up pelvic ultrasound or further evaluation with pelvic MRI with and without IV contrast as clinically warranted. Electronically Signed   By: Dorise Bullion III M.D   On: 10/25/2018 00:38   Assessment:  Delayed postpartum hemorrhage  Discussed options for management of PPH with possibility of retained placenta (fragmented placenta on delivery)    D&C    Methergine and close f/u  Offered D&C today        But as she is bleeding less since passing large tissue yesterday and [place] after that occurrence did not show large mass or area w high degree of blood flow, it is reasonable to wait 48-72 hours and see how she does.  Methergine to help w clamping down on uterine placental site to help w bleeding.  Barnett Applebaum, MD, [name], [facility] 10/25/2018  3:38 PM

## 2018-10-26 ENCOUNTER — Inpatient Hospital Stay: Admit: 2018-10-26 | Payer: BLUE CROSS/BLUE SHIELD

## 2018-10-30 ENCOUNTER — Telehealth: Payer: Self-pay | Admitting: Obstetrics & Gynecology

## 2018-10-30 NOTE — Telephone Encounter (Signed)
Noted. Will order to arrive by apt date/time. 

## 2018-10-30 NOTE — Telephone Encounter (Signed)
12/02/2018 at 8:40 nexplanon insertion

## 2018-11-05 ENCOUNTER — Other Ambulatory Visit: Payer: Self-pay | Admitting: Gastroenterology

## 2018-11-05 DIAGNOSIS — B181 Chronic viral hepatitis B without delta-agent: Secondary | ICD-10-CM | POA: Diagnosis not present

## 2018-12-02 ENCOUNTER — Encounter: Payer: Self-pay | Admitting: Obstetrics & Gynecology

## 2018-12-02 ENCOUNTER — Ambulatory Visit (INDEPENDENT_AMBULATORY_CARE_PROVIDER_SITE_OTHER): Payer: BLUE CROSS/BLUE SHIELD | Admitting: Obstetrics & Gynecology

## 2018-12-02 ENCOUNTER — Other Ambulatory Visit (HOSPITAL_COMMUNITY)
Admission: RE | Admit: 2018-12-02 | Discharge: 2018-12-02 | Disposition: A | Discharge status: Home / Self Care | Payer: BLUE CROSS/BLUE SHIELD | Source: Ambulatory Visit | Attending: Obstetrics & Gynecology | Admitting: Obstetrics & Gynecology

## 2018-12-02 DIAGNOSIS — Z3046 Encounter for surveillance of implantable subdermal contraceptive: Secondary | ICD-10-CM | POA: Diagnosis not present

## 2018-12-02 DIAGNOSIS — Z124 Encounter for screening for malignant neoplasm of cervix: Secondary | ICD-10-CM

## 2018-12-02 DIAGNOSIS — Z30017 Encounter for initial prescription of implantable subdermal contraceptive: Secondary | ICD-10-CM

## 2018-12-02 NOTE — Progress Notes (Signed)
  OBSTETRICS POSTPARTUM CLINIC PROGRESS NOTE  Subjective:     Laura Espinoza is a 42 y.o. [phone] female who presents for a postpartum visit. She is 6 weeks postpartum following a Term pregnancy and delivery by Vaginal problems after delivery including VAVD, PPH.  I have fully reviewed the prenatal and intrapartum course. Anesthesia: epidural.  Postpartum course has been complicated by uncomplicated.  Baby is feeding by Breast.  Bleeding: patient has not  resumed menses.  Bowel function is normal. Bladder function is normal.  Patient is not sexually active. Contraception method desired is Nexplanon.  Postpartum depression screening: negative. Edinburgh 7.  The following portions of the patient's history were reviewed and updated as appropriate: allergies, current medications, past family history, past medical history, past social history, past surgical history and problem list.  Review of Systems Pertinent items are noted in HPI.  Objective:    BP 120/80   Ht 5\' 1"  (1.549 m)   Wt 164 lb (74.4 kg)   LMP 11/17/2018   BMI 30.99 kg/m   General:  alert and no distress   Breasts:  inspection negative, no nipple discharge or bleeding, no masses or nodularity palpable  Lungs: clear to auscultation bilaterally  Heart:  regular rate and rhythm, S1, S2 normal, no murmur, click, rub or gallop  Abdomen: soft, non-tender; bowel sounds normal; no masses,  no organomegaly.     Vulva:  normal  Vagina: normal vagina, no discharge, exudate, lesion, or erythema  Cervix:  no cervical motion tenderness and no lesions  Corpus: normal size, contour, position, consistency, mobility, non-tender  Adnexa:  normal adnexa and no mass, fullness, tenderness  Rectal Exam: Not performed.          Assessment:  Post Partum Care visit 1. Encounter for postpartum care and examination after delivery  2. Nexplanon insertion  3. Screening for cervical cancer  Plan:  See orders and Patient  Instructions Contraceptive counseling for implant pros and cons dicusesed Follow up in: 4 weeks or as needed.    Nexplanon Insertion  Patient given informed consent, signed copy in the chart, time out was performed. Appropriate time out taken.  Patient's left arm was prepped and draped in the usual sterile fashion.. The ruler used to measure and mark insertion area.  Pt was prepped with betadine swab and then injected with 3 cc of 2% lidocaine with epinephrine. Nexplanon removed form packaging,  Device confirmed in needle, then inserted full length of needle and withdrawn per handbook instructions.  Pt insertion site covered with steri-strip and a bandage.   Minimal blood loss.  Pt tolerated the procedure well.   Laura Applebaum, MD, [facility], [facility] 12/02/2018  8:50 AM

## 2018-12-02 NOTE — Patient Instructions (Signed)
Nexplanon Instructions After Insertion  Keep bandage clean and dry for 24 hours  May use ice/Tylenol/Ibuprofen for soreness or pain  If you develop fever, drainage or increased warmth from incision site-contact office immediately   

## 2018-12-06 ENCOUNTER — Ambulatory Visit
Admission: RE | Admit: 2018-12-06 | Discharge: 2018-12-06 | Disposition: A | Discharge status: Home / Self Care | Payer: BLUE CROSS/BLUE SHIELD | Source: Ambulatory Visit | Attending: Gastroenterology | Admitting: Gastroenterology

## 2018-12-06 DIAGNOSIS — B181 Chronic viral hepatitis B without delta-agent: Secondary | ICD-10-CM | POA: Diagnosis not present

## 2018-12-09 LAB — CYTOLOGY - PAP: DIAGNOSIS: NEGATIVE

## 2018-12-30 ENCOUNTER — Ambulatory Visit: Payer: BLUE CROSS/BLUE SHIELD | Admitting: Obstetrics & Gynecology

## 2019-01-01 ENCOUNTER — Ambulatory Visit (INDEPENDENT_AMBULATORY_CARE_PROVIDER_SITE_OTHER): Payer: BLUE CROSS/BLUE SHIELD | Admitting: Obstetrics & Gynecology

## 2019-01-01 ENCOUNTER — Encounter: Payer: Self-pay | Admitting: Obstetrics & Gynecology

## 2019-01-01 VITALS — BP 120/80 | Ht 61.0 in | Wt 175.0 lb

## 2019-01-01 DIAGNOSIS — N926 Irregular menstruation, unspecified: Secondary | ICD-10-CM

## 2019-01-01 DIAGNOSIS — Z3046 Encounter for surveillance of implantable subdermal contraceptive: Secondary | ICD-10-CM | POA: Diagnosis not present

## 2019-01-01 NOTE — Progress Notes (Signed)
  History of Present Illness:  Laura Espinoza is a 43 y.o. that had a Nexplanon subdermal implant placed approximately 4 weeks ago. Since that time, she states that she has had irregular bleeding, and has not had pain in her arm or fingers. Incisional concerns: No.  Also has some mood irritability.  Bleeding is lighter this week, irreg.  PMHx: She  has a past medical history of Allergic rhinitis, Anxiety, Breast mass, right (11/11/2017), Chest pain, Dyspnea on exertion, Gastritis, Hepatitis B, OM (otitis media), recurrent, Pneumonia (2007), and Recurrent sinusitis. Also,  has a past surgical history that includes Refractive surgery (Bilateral, 1998); Laparoscopic ovarian cystectomy (2008); fallopian tube removal (Left, March 2014); Tympanoplasty with graft (Left, 2017); and Inner ear surgery., family history includes Arthritis in her mother; Breast cancer in her paternal aunt; CAD in her father and mother; Cancer in her father, maternal grandfather, and paternal grandfather; Glaucoma in her father; Heart attack in her mother; Heart disease in her father and mother; Hypertension in her brother and mother; Stroke in her paternal grandmother.,  reports that she has never smoked. She has never used smokeless tobacco. She reports previous alcohol use. She reports that she does not use drugs.  She has a current medication list which includes the following prescription(s): etonogestrel, prenatal vit-fe fumarate-fa, aspirin ec, famotidine, ferrous sulfate dried, ibuprofen, and loratadine. Also, is allergic to procaine.  Review of Systems  All other systems reviewed and are negative.   Physical Exam:  BP 120/80   Ht 5\' 1"  (1.549 m)   Wt 175 lb (79.4 kg)   BMI 33.07 kg/m  Body mass index is 33.07 kg/m. Constitutional: Well nourished, well developed female in no acute distress.  Neuro: Grossly intact Psych:  Normal mood and affect.   MS: left arm with palpable implant and no skin disruption  Assessment:  Nexplanon present in proper location; pt doing well  Plan: She was told to continue to use barrier contraception, in order to prevent any STIs, and to take a home pregnancy test or call us if she ever thinks she may be pregnant, and that her Nexplanon expires in 3 years.  Monitor bleeding, mood sx's.  Consider OCP one month if needs transitional help.  Options to change to other [facility] also discussed.  She was amenable to this plan and we will see her back in 1 year/PRN.  A total of 15 minutes were spent face-to-face with the patient during this encounter and over half of that time dealt with counseling and coordination of care.  Barnett Applebaum, MD, [name], [facility] 01/01/2019  10:14 AM

## 2019-02-06 DIAGNOSIS — B181 Chronic viral hepatitis B without delta-agent: Secondary | ICD-10-CM | POA: Diagnosis not present

## 2019-04-10 DIAGNOSIS — B181 Chronic viral hepatitis B without delta-agent: Secondary | ICD-10-CM | POA: Diagnosis not present

## 2019-04-25 DIAGNOSIS — B181 Chronic viral hepatitis B without delta-agent: Secondary | ICD-10-CM | POA: Diagnosis not present

## 2019-05-22 ENCOUNTER — Other Ambulatory Visit: Payer: Self-pay | Admitting: Gastroenterology

## 2019-05-22 DIAGNOSIS — K7460 Unspecified cirrhosis of liver: Secondary | ICD-10-CM

## 2019-05-22 DIAGNOSIS — B181 Chronic viral hepatitis B without delta-agent: Secondary | ICD-10-CM

## 2019-06-02 ENCOUNTER — Ambulatory Visit: Payer: BC Managed Care – PPO

## 2019-06-03 ENCOUNTER — Other Ambulatory Visit: Payer: Self-pay

## 2019-06-03 ENCOUNTER — Ambulatory Visit
Admission: RE | Admit: 2019-06-03 | Discharge: 2019-06-03 | Disposition: A | Discharge status: Home / Self Care | Payer: BC Managed Care – PPO | Source: Ambulatory Visit | Attending: Gastroenterology | Admitting: Gastroenterology

## 2019-06-03 DIAGNOSIS — B181 Chronic viral hepatitis B without delta-agent: Secondary | ICD-10-CM | POA: Insufficient documentation

## 2019-06-03 DIAGNOSIS — K7460 Unspecified cirrhosis of liver: Secondary | ICD-10-CM | POA: Insufficient documentation

## 2019-07-15 DIAGNOSIS — K7460 Unspecified cirrhosis of liver: Secondary | ICD-10-CM | POA: Diagnosis not present

## 2019-07-15 DIAGNOSIS — B181 Chronic viral hepatitis B without delta-agent: Secondary | ICD-10-CM | POA: Diagnosis not present

## 2019-11-20 ENCOUNTER — Other Ambulatory Visit: Payer: Self-pay | Admitting: Gastroenterology

## 2019-11-20 DIAGNOSIS — B181 Chronic viral hepatitis B without delta-agent: Secondary | ICD-10-CM

## 2019-11-20 DIAGNOSIS — R1013 Epigastric pain: Secondary | ICD-10-CM | POA: Diagnosis not present

## 2019-11-28 ENCOUNTER — Ambulatory Visit
Admission: RE | Admit: 2019-11-28 | Discharge: 2019-11-28 | Disposition: A | Discharge status: Home / Self Care | Payer: BC Managed Care – PPO | Source: Ambulatory Visit | Attending: Gastroenterology | Admitting: Gastroenterology

## 2019-11-28 ENCOUNTER — Other Ambulatory Visit: Payer: Self-pay

## 2019-11-28 DIAGNOSIS — B181 Chronic viral hepatitis B without delta-agent: Secondary | ICD-10-CM | POA: Insufficient documentation

## 2019-12-23 ENCOUNTER — Encounter: Payer: Self-pay | Admitting: Obstetrics & Gynecology

## 2019-12-23 ENCOUNTER — Ambulatory Visit (INDEPENDENT_AMBULATORY_CARE_PROVIDER_SITE_OTHER): Payer: BC Managed Care – PPO | Admitting: Obstetrics & Gynecology

## 2019-12-23 ENCOUNTER — Other Ambulatory Visit (HOSPITAL_COMMUNITY)
Admission: RE | Admit: 2019-12-23 | Discharge: 2019-12-23 | Disposition: A | Discharge status: Home / Self Care | Payer: BC Managed Care – PPO | Source: Ambulatory Visit | Attending: Obstetrics & Gynecology | Admitting: Obstetrics & Gynecology

## 2019-12-23 ENCOUNTER — Other Ambulatory Visit: Payer: Self-pay

## 2019-12-23 VITALS — BP 140/80 | Ht 61.0 in | Wt 182.0 lb

## 2019-12-23 DIAGNOSIS — Z131 Encounter for screening for diabetes mellitus: Secondary | ICD-10-CM

## 2019-12-23 DIAGNOSIS — Z124 Encounter for screening for malignant neoplasm of cervix: Secondary | ICD-10-CM | POA: Insufficient documentation

## 2019-12-23 DIAGNOSIS — Z1329 Encounter for screening for other suspected endocrine disorder: Secondary | ICD-10-CM

## 2019-12-23 DIAGNOSIS — Z01419 Encounter for gynecological examination (general) (routine) without abnormal findings: Secondary | ICD-10-CM

## 2019-12-23 DIAGNOSIS — Z13220 Encounter for screening for lipoid disorders: Secondary | ICD-10-CM

## 2019-12-23 DIAGNOSIS — Z1321 Encounter for screening for nutritional disorder: Secondary | ICD-10-CM

## 2019-12-23 DIAGNOSIS — Z1231 Encounter for screening mammogram for malignant neoplasm of breast: Secondary | ICD-10-CM

## 2019-12-23 NOTE — Patient Instructions (Signed)
PAP every three years    Done today Mammogram every year    Call [phone] to schedule at [facility] yearly (with PCP)  Message me/Call if periods continue to be prolonged    Next option will be month of pills to overlap Nexplanon's effects

## 2019-12-23 NOTE — Progress Notes (Signed)
HPI:      Laura Espinoza is a 44 y.o. [phone] who LMP has been irregular these past few months on Nexplanon and since she stopped breast feeding (was amenorrhic prior w Nexplanon & breast feeding), she presents today for her annual examination. The patient has no complaints today, other than the irregular bleeding. The patient is sexually active. Her last pap: approximate date 2019 and was normal and last mammogram: approximate date 2018 and was normal. The patient does perform self breast exams.  There is notable family history of breast or ovarian cancer in her family (PAT AUNT).  The patient has regular exercise: yes.  The patient denies current symptoms of depression.    GYN History: Contraception: Nexplanon  PMHx: Past Medical History:  Diagnosis Date  . Allergic rhinitis   . Anxiety   . Breast mass, right 11/11/2017   Nov 09, 2017, add'l views ordered  . Chest pain    a. 12/2015 ETT: Ex time: 8:00, Max HR 169 (92%), BP 201/71, METS 10.1, No ecg changes.  Marland Kitchen Dyspnea on exertion    a. 12/2015 Echo: EF 60-65%, no rwma.  . Gastritis   . Hepatitis B   . OM (otitis media), recurrent    as a child; ruptured TM  . Pneumonia 2007   hospitalized for possible pneumonia in [place] for 2 weeks  . Recurrent sinusitis    Past Surgical History:  Procedure Laterality Date  . fallopian tube removal Left March 2014  . INNER EAR SURGERY    . LAPAROSCOPIC OVARIAN CYSTECTOMY  2008   in [place]  . REFRACTIVE SURGERY Bilateral 1998  . TYMPANOPLASTY WITH GRAFT Left 2017   [place], Dr. Ned Grace   Family History  Problem Relation Age of Onset  . Hypertension Mother   . Heart disease Mother   . CAD Mother   . Heart attack Mother   . Arthritis Mother   . CAD Father   . Heart disease Father   . Glaucoma Father   . Cancer Father        kidney  . Hypertension Brother   . Breast cancer Paternal Aunt        late 42's- early 20's  . Cancer Maternal Grandfather        unknown  . Stroke Paternal  Grandmother   . Cancer Paternal Grandfather        lung?  Marland Kitchen COPD Neg Hx   . Diabetes Neg Hx    Social History   Tobacco Use  . Smoking status: Never Smoker  . Smokeless tobacco: Never Used  Substance Use Topics  . Alcohol use: Not Currently  . Drug use: No    Current Outpatient Medications:  .  etonogestrel (NEXPLANON) 68 MG IMPL implant, 1 each by Subdermal route once., Disp: , Rfl:  .  Prenatal Vit-Fe Fumarate-FA (PRENATAL VITAMIN PO), Take by mouth., Disp: , Rfl:  .  famotidine (PEPCID) 20 MG tablet, Take by mouth., Disp: , Rfl:  .  Ferrous Sulfate Dried 45 MG TBCR, Take by mouth., Disp: , Rfl:  .  loratadine (CLARITIN) 10 MG tablet, Take 10 mg by mouth daily., Disp: , Rfl:  Allergies: Procaine  Review of Systems  Constitutional: Negative for chills, fever and malaise/fatigue.  HENT: Negative for congestion, sinus pain and sore throat.   Eyes: Negative for blurred vision and pain.  Respiratory: Negative for cough and wheezing.   Cardiovascular: Negative for chest pain and leg swelling.  Gastrointestinal: Negative for  abdominal pain, constipation, diarrhea, heartburn, nausea and vomiting.  Genitourinary: Negative for dysuria, frequency, hematuria and urgency.  Musculoskeletal: Negative for back pain, joint pain, myalgias and neck pain.  Skin: Negative for itching and rash.  Neurological: Negative for dizziness, tremors and weakness.  Endo/Heme/Allergies: Does not bruise/bleed easily.  Psychiatric/Behavioral: Negative for depression. The patient is not nervous/anxious and does not have insomnia.     Objective: BP 140/80   Ht 5\' 1"  (1.549 m)   Wt 182 lb (82.6 kg)   BMI 34.39 kg/m   Filed Weights   12/23/19 0805  Weight: 182 lb (82.6 kg)   Body mass index is 34.39 kg/m. Physical Exam Constitutional:      General: She is not in acute distress.    Appearance: She is well-developed.  Genitourinary:     Pelvic exam was performed with patient supine.     Vagina,  uterus and rectum normal.     No lesions in the vagina.     No vaginal bleeding.     No cervical motion tenderness, friability, lesion or polyp.     Uterus is mobile.     Uterus is not enlarged.     No uterine mass detected.    Uterus is midaxial.     No right or left adnexal mass present.     Right adnexa not tender.     Left adnexa not tender.  HENT:     Head: Normocephalic and atraumatic. No laceration.     Right Ear: Hearing normal.     Left Ear: Hearing normal.     Mouth/Throat:     Pharynx: Uvula midline.  Eyes:     Pupils: Pupils are equal, round, and reactive to light.  Neck:     Thyroid: No thyromegaly.  Cardiovascular:     Rate and Rhythm: Normal rate and regular rhythm.     Heart sounds: No murmur. No friction rub. No gallop.   Pulmonary:     Effort: Pulmonary effort is normal. No respiratory distress.     Breath sounds: Normal breath sounds. No wheezing.  Chest:     Breasts:        Right: No mass, skin change or tenderness.        Left: No mass, skin change or tenderness.  Abdominal:     General: Bowel sounds are normal. There is no distension.     Palpations: Abdomen is soft.     Tenderness: There is no abdominal tenderness. There is no rebound.  Musculoskeletal:        General: Normal range of motion.     Cervical back: Normal range of motion and neck supple.  Neurological:     Mental Status: She is alert and oriented to person, place, and time.     Cranial Nerves: No cranial nerve deficit.  Skin:    General: Skin is warm and dry.  Psychiatric:        Judgment: Judgment normal.  Vitals reviewed.     Assessment:  ANNUAL EXAM 1. Women's annual routine gynecological examination   2. Screening for cervical cancer   3. Encounter for screening mammogram for malignant neoplasm of breast   4. Screening for thyroid disorder   5. Screening for diabetes mellitus   6. Screening for cholesterol level   7. Encounter for vitamin deficiency screening       Screening Plan:            1.  Cervical Screening-  Pap smear  done today  2. Breast screening- Exam annually and mammogram>40 planned   3. Colonoscopy every 10 years, Hemoccult testing - after age 19 (not yet)  4. Labs - to be done soon.  Pt has to change PCP has her previous one has left (Dr Sanda Klein) and prefers not to go to new offices [phone]) at this time.  WIll return for fasting labs.  5. Counseling for contraception: Cont Nexplanon for now and monitor prolonged bleeding pattern.  If continues, then consider overlap OCP pack one month as next intervention.  Pros and cons of Nexplanon and other contraception discussed.  Does not desire any more pregnancies.    F/U  Return in about 1 year (around 12/22/2020) for Annual, and LAB appt soon (am).  Barnett Applebaum, MD, [name], [facility] 12/23/2019  8:33 AM

## 2019-12-24 LAB — CYTOLOGY - PAP
Comment: NEGATIVE
Diagnosis: NEGATIVE
High risk HPV: NEGATIVE

## 2020-01-01 ENCOUNTER — Other Ambulatory Visit: Payer: BC Managed Care – PPO

## 2020-01-01 ENCOUNTER — Other Ambulatory Visit: Payer: Self-pay

## 2020-01-01 DIAGNOSIS — Z1329 Encounter for screening for other suspected endocrine disorder: Secondary | ICD-10-CM

## 2020-01-01 DIAGNOSIS — Z131 Encounter for screening for diabetes mellitus: Secondary | ICD-10-CM

## 2020-01-01 DIAGNOSIS — Z13220 Encounter for screening for lipoid disorders: Secondary | ICD-10-CM

## 2020-01-01 DIAGNOSIS — Z1321 Encounter for screening for nutritional disorder: Secondary | ICD-10-CM

## 2020-01-02 LAB — LIPID PANEL
Chol/HDL Ratio: 3.1 ratio (ref 0.0–4.4)
Cholesterol, Total: 172 mg/dL (ref 100–199)
HDL: 56 mg/dL (ref >39)
LDL Chol Calc (NIH): 95 mg/dL (ref 0–99)
Triglycerides: 120 mg/dL (ref 0–149)
VLDL Cholesterol Cal: 21 mg/dL (ref 5–40)

## 2020-01-02 LAB — TSH: TSH: 3.1 u[IU]/mL (ref 0.450–4.500)

## 2020-01-02 LAB — GLUCOSE, FASTING: Glucose, Plasma: 80 mg/dL (ref 65–99)

## 2020-01-28 NOTE — Telephone Encounter (Signed)
Nexplanon rcvd/charged 12/02/18

## 2020-02-18 ENCOUNTER — Ambulatory Visit
Admission: RE | Admit: 2020-02-18 | Discharge: 2020-02-18 | Disposition: A | Discharge status: Home / Self Care | Payer: BC Managed Care – PPO | Source: Ambulatory Visit | Attending: Obstetrics & Gynecology | Admitting: Obstetrics & Gynecology

## 2020-02-18 DIAGNOSIS — Z1231 Encounter for screening mammogram for malignant neoplasm of breast: Secondary | ICD-10-CM | POA: Diagnosis not present

## 2020-03-11 ENCOUNTER — Ambulatory Visit: Payer: BC Managed Care – PPO | Attending: Internal Medicine

## 2020-03-11 DIAGNOSIS — Z23 Encounter for immunization: Secondary | ICD-10-CM

## 2020-03-11 NOTE — Progress Notes (Signed)
   Covid-19 Vaccination Clinic  Name:  Laura Espinoza    MRN: [id number] DOB: 1976/07/21  03/11/2020  [name] was observed post Covid-19 immunization for 15 minutes without incident. She was provided with Vaccine Information Sheet and instruction to access the V-Safe system.   [name] was instructed to call 911 with any severe reactions post vaccine: Marland Kitchen Difficulty breathing  . Swelling of face and throat  . A fast heartbeat  . A bad rash all over body  . Dizziness and weakness   Immunizations Administered    Name Date Dose VIS Date Route   Pfizer COVID-19 Vaccine 03/11/2020  8:41 AM 0.3 mL 11/21/2019 Intramuscular   Manufacturer: [place]   Lot: [id number]   [place]: [id number]

## 2020-04-07 ENCOUNTER — Ambulatory Visit: Payer: BC Managed Care – PPO | Attending: Internal Medicine

## 2020-04-07 ENCOUNTER — Other Ambulatory Visit: Payer: Self-pay

## 2020-04-07 DIAGNOSIS — Z23 Encounter for immunization: Secondary | ICD-10-CM

## 2020-04-07 NOTE — Progress Notes (Signed)
   Covid-19 Vaccination Clinic  Name:  VALESKA COURTADE    MRN: [id number] DOB: Dec 17, 1975  04/07/2020  Ms. Mills was observed post Covid-19 immunization for 15 minutes without incident. She was provided with Vaccine Information Sheet and instruction to access the V-Safe system.   Ms. Nuehring was instructed to call 911 with any severe reactions post vaccine: Marland Kitchen Difficulty breathing  . Swelling of face and throat  . A fast heartbeat  . A bad rash all over body  . Dizziness and weakness   Immunizations Administered    Name Date Dose VIS Date Route   Pfizer COVID-19 Vaccine 04/07/2020  8:22 AM 0.3 mL 02/04/2019 Intramuscular   Manufacturer: [place]   Lot: [id number]   [place]: [id number]

## 2020-05-20 ENCOUNTER — Other Ambulatory Visit: Payer: Self-pay | Admitting: Gastroenterology

## 2020-05-20 DIAGNOSIS — B181 Chronic viral hepatitis B without delta-agent: Secondary | ICD-10-CM

## 2020-06-01 ENCOUNTER — Other Ambulatory Visit: Payer: BC Managed Care – PPO

## 2020-06-07 ENCOUNTER — Other Ambulatory Visit: Payer: Self-pay

## 2020-06-07 ENCOUNTER — Ambulatory Visit
Admission: RE | Admit: 2020-06-07 | Discharge: 2020-06-07 | Disposition: A | Discharge status: Home / Self Care | Payer: BC Managed Care – PPO | Source: Ambulatory Visit | Attending: Gastroenterology | Admitting: Gastroenterology

## 2020-06-07 DIAGNOSIS — B182 Chronic viral hepatitis C: Secondary | ICD-10-CM | POA: Diagnosis not present

## 2020-06-07 DIAGNOSIS — B181 Chronic viral hepatitis B without delta-agent: Secondary | ICD-10-CM | POA: Diagnosis not present

## 2020-11-18 ENCOUNTER — Other Ambulatory Visit: Payer: Self-pay | Admitting: Gastroenterology

## 2020-11-18 DIAGNOSIS — B181 Chronic viral hepatitis B without delta-agent: Secondary | ICD-10-CM

## 2020-11-18 DIAGNOSIS — K7460 Unspecified cirrhosis of liver: Secondary | ICD-10-CM | POA: Diagnosis not present

## 2020-11-26 ENCOUNTER — Other Ambulatory Visit: Payer: Self-pay

## 2020-11-26 ENCOUNTER — Ambulatory Visit
Admission: RE | Admit: 2020-11-26 | Discharge: 2020-11-26 | Disposition: A | Discharge status: Home / Self Care | Payer: BC Managed Care – PPO | Source: Ambulatory Visit | Attending: Gastroenterology | Admitting: Gastroenterology

## 2020-11-26 DIAGNOSIS — B181 Chronic viral hepatitis B without delta-agent: Secondary | ICD-10-CM | POA: Insufficient documentation

## 2020-11-26 DIAGNOSIS — B182 Chronic viral hepatitis C: Secondary | ICD-10-CM | POA: Diagnosis not present

## 2020-11-26 DIAGNOSIS — K7460 Unspecified cirrhosis of liver: Secondary | ICD-10-CM | POA: Insufficient documentation

## 2021-01-07 ENCOUNTER — Ambulatory Visit: Payer: BC Managed Care – PPO | Admitting: Family Medicine

## 2021-01-07 ENCOUNTER — Other Ambulatory Visit: Payer: Self-pay

## 2021-01-07 ENCOUNTER — Encounter: Payer: Self-pay | Admitting: Family Medicine

## 2021-01-07 VITALS — BP 184/92 | HR 92 | Resp 18 | Ht 61.0 in | Wt 183.0 lb

## 2021-01-07 DIAGNOSIS — Z0000 Encounter for general adult medical examination without abnormal findings: Secondary | ICD-10-CM

## 2021-01-07 DIAGNOSIS — I10 Essential (primary) hypertension: Secondary | ICD-10-CM | POA: Diagnosis not present

## 2021-01-07 MED ORDER — HYDROCHLOROTHIAZIDE 12.5 MG PO TABS: 12.5000 mg | ORAL_TABLET | Freq: Every day | ORAL | 3 refills | Status: DC

## 2021-01-07 NOTE — Assessment & Plan Note (Signed)
Patient here to establish care, has not had a PCP since Dr Sanda Klein left the area. In general she is doing well but has had elevated BP with headache associated.

## 2021-01-07 NOTE — Assessment & Plan Note (Signed)
PATIENT WITH SEVERAL EPISODES OF ELEVATED BP with headache this week. BP elevated today, no CP or SOB.

## 2021-01-07 NOTE — Progress Notes (Addendum)
Established Patient Office Visit  SUBJECTIVE:  Subjective  Patient ID: Laura Espinoza, female    DOB: 05-15-1976  Age: 45 y.o. MRN: [id number]  CC:  Chief Complaint  Patient presents with  . New Patient (Initial Visit)    Patient is here to establish care     HPI Laura Espinoza is a 45 y.o. female presenting today for     Past Medical History:  Diagnosis Date  . Allergic rhinitis   . Anxiety   . Breast mass, right 11/11/2017   Nov 09, 2017, add'l views ordered  . Chest pain    a. 12/2015 ETT: Ex time: 8:00, Max HR 169 (92%), BP 201/71, METS 10.1, No ecg changes.  Marland Kitchen Dyspnea on exertion    a. 12/2015 Echo: EF 60-65%, no rwma.  . Gastritis   . Hepatitis B   . OM (otitis media), recurrent    as a child; ruptured TM  . Pneumonia 2007   hospitalized for possible pneumonia in [place] for 2 weeks  . Recurrent sinusitis     Past Surgical History:  Procedure Laterality Date  . fallopian tube removal Left March 2014  . INNER EAR SURGERY    . LAPAROSCOPIC OVARIAN CYSTECTOMY  2008   in [place]  . REFRACTIVE SURGERY Bilateral 1998  . TYMPANOPLASTY WITH GRAFT Left 2017   [place], Dr. Ned Grace    Family History  Problem Relation Age of Onset  . Hypertension Mother   . Heart disease Mother   . CAD Mother   . Heart attack Mother   . Arthritis Mother   . CAD Father   . Heart disease Father   . Glaucoma Father   . Cancer Father        kidney  . Hypertension Brother   . Breast cancer Paternal Aunt        late 68's- early 84's  . Cancer Maternal Grandfather        unknown  . Stroke Paternal Grandmother   . Cancer Paternal Grandfather        lung?  Marland Kitchen COPD Neg Hx   . Diabetes Neg Hx     Social History   Socioeconomic History  . Marital status: Married    Spouse name: Not on file  . Number of children: Not on file  . Years of education: Not on file  . Highest education level: Not on file  Occupational History  . Not on file  Tobacco Use  . Smoking status: Never  Smoker  . Smokeless tobacco: Never Used  Vaping Use  . Vaping Use: Never used  Substance and Sexual Activity  . Alcohol use: Not Currently  . Drug use: No  . Sexual activity: Not Currently    Birth control/protection: Implant  Other Topics Concern  . Not on file  Social History Narrative  . Not on file   Social Determinants of Health   Financial Resource Strain: Not on file  Food Insecurity: Not on file  Transportation Needs: Not on file  Physical Activity: Not on file  Stress: Not on file  Social Connections: Not on file  Intimate Partner Violence: Not on file     Current Outpatient Medications:  .  hydrochlorothiazide (HYDRODIURIL) 12.5 MG tablet, Take 1 tablet (12.5 mg total) by mouth daily., Disp: 90 tablet, Rfl: 3 .  etonogestrel (NEXPLANON) 68 MG IMPL implant, 1 each by Subdermal route once., Disp: , Rfl:  .  famotidine (PEPCID) 20 MG tablet, Take by mouth.,  Disp: , Rfl:  .  Ferrous Sulfate Dried 45 MG TBCR, Take by mouth., Disp: , Rfl:  .  loratadine (CLARITIN) 10 MG tablet, Take 10 mg by mouth daily., Disp: , Rfl:  .  Prenatal Vit-Fe Fumarate-FA (PRENATAL VITAMIN PO), Take by mouth., Disp: , Rfl:    Allergies  Allergen Reactions  . Procaine Anaphylaxis and Shortness Of Breath    ROS Review of Systems  Constitutional: Negative.   HENT: Negative.   Respiratory: Negative.   Cardiovascular: Negative.   Genitourinary: Negative.   Neurological: Negative.   Psychiatric/Behavioral: Negative.      OBJECTIVE:    Physical Exam Vitals and nursing note reviewed.  Constitutional:      Appearance: She is obese.  HENT:     Head: Normocephalic.     Nose: Nose normal.     Mouth/Throat:     Mouth: Mucous membranes are moist.  Eyes:     Pupils: Pupils are equal, round, and reactive to light.  Cardiovascular:     Rate and Rhythm: Normal rate and regular rhythm.     Pulses: Normal pulses.  Pulmonary:     Effort: Pulmonary effort is normal.  Musculoskeletal:         General: Normal range of motion.     Cervical back: Normal range of motion.  Neurological:     General: No focal deficit present.     Mental Status: She is alert.     BP (!) 184/92   Pulse 92   Resp 18   Ht 5\' 1"  (1.549 m)   Wt 183 lb (83 kg)   SpO2 98%   BMI 34.58 kg/m  Wt Readings from Last 3 Encounters:  01/07/21 183 lb (83 kg)  12/23/19 182 lb (82.6 kg)  01/01/19 175 lb (79.4 kg)    Health Maintenance Due  Topic Date Due  . Hepatitis C Screening  Never done  . INFLUENZA VACCINE  07/11/2020  . COVID-19 Vaccine (3 - Booster for Pfizer series) 10/07/2020    There are no preventive care reminders to display for this patient.  CBC Latest Ref Rng & Units 10/24/2018 10/18/2018 10/17/2018  WBC 4.0 - 10.5 K/uL 9.0 16.7(H) 11.8(H)  Hemoglobin 12.0 - 15.0 g/dL 10.4(L) 11.8(L) 14.4  Hematocrit 36.0 - 46.0 % 30.8(L) 33.8(L) 41.3  Platelets 150 - 400 K/uL 345 220 244   CMP Latest Ref Rng & Units 10/24/2018 03/16/2016 11/17/2015  Glucose 70 - 99 mg/dL 696(E117(H) - 82  BUN 6 - 20 mg/dL 17 - 13  Creatinine 9.520.44 - 1.00 mg/dL 8.410.51 - 3.240.85  Sodium 401135 - 145 mmol/L 141 - 139  Potassium 3.5 - 5.1 mmol/L 3.6 - 5.0  Chloride 98 - 111 mmol/L 112(H) - 98  CO2 22 - 32 mmol/L 21(L) - 25  Calcium 8.9 - 10.3 mg/dL 0.2(V8.7(L) - 9.8  Total Protein 6.5 - 8.1 g/dL 2.5(D5.8(L) 7.5 7.2  Total Bilirubin 0.3 - 1.2 mg/dL 0.5 0.5 0.7  Alkaline Phos 38 - 126 U/L 125 71 67  AST 15 - 41 U/L 40 20 19  ALT 0 - 44 U/L 35 19 15    Lab Results  Component Value Date   TSH 3.100 01/01/2020   Lab Results  Component Value Date   ALBUMIN 3.0 (L) 10/24/2018   ANIONGAP 8 10/24/2018   Lab Results  Component Value Date   CHOL 172 01/01/2020   CHOL 207 (H) 11/17/2015   HDL 56 01/01/2020   HDL 59 11/17/2015  [place] 95 01/01/2020   LDLCALC 117 (H) 11/17/2015   CHOLHDL 3.1 01/01/2020   Lab Results  Component Value Date   TRIG 120 01/01/2020   No results found for: HGBA1C    ASSESSMENT & PLAN:   Problem List  Items Addressed This Visit      Cardiovascular and Mediastinum   Primary hypertension - Primary    PATIENT WITH SEVERAL EPISODES OF ELEVATED BP with headache this week. BP elevated today, no CP or SOB.      Relevant Medications   hydrochlorothiazide (HYDRODIURIL) 12.5 MG tablet     Other   Encounter for medical examination to establish care    Patient here to establish care, has not had a PCP since Dr Sanda Klein left the area. In general she is doing well but has had elevated BP with headache associated.          Meds ordered this encounter  Medications  . hydrochlorothiazide (HYDRODIURIL) 12.5 MG tablet    Sig: Take 1 tablet (12.5 mg total) by mouth daily.    Dispense:  90 tablet    Refill:  3      Follow-up: No follow-ups on file.    Beckie Salts, [place] [address] [place], [place] [address]

## 2021-01-18 ENCOUNTER — Other Ambulatory Visit (INDEPENDENT_AMBULATORY_CARE_PROVIDER_SITE_OTHER): Payer: BC Managed Care – PPO

## 2021-01-18 DIAGNOSIS — I10 Essential (primary) hypertension: Secondary | ICD-10-CM | POA: Diagnosis not present

## 2021-01-18 DIAGNOSIS — Z6835 Body mass index (BMI) 35.0-35.9, adult: Secondary | ICD-10-CM | POA: Diagnosis not present

## 2021-01-18 DIAGNOSIS — Z0000 Encounter for general adult medical examination without abnormal findings: Secondary | ICD-10-CM

## 2021-01-19 LAB — LIPID PANEL
Cholesterol: 172 mg/dL (ref <200)
HDL: 67 mg/dL (ref >=50)
LDL Cholesterol (Calc): 88 mg/dL (calc)
Non-HDL Cholesterol (Calc): 105 mg/dL (calc) (ref <130)
Total CHOL/HDL Ratio: 2.6 (calc) (ref <5.0)
Triglycerides: 83 mg/dL (ref <150)

## 2021-01-19 LAB — COMPLETE METABOLIC PANEL WITH GFR
AG Ratio: 1.6 (calc) (ref 1.0–2.5)
ALT: 19 U/L (ref 6–29)
AST: 21 U/L (ref 10–30)
Albumin: 4.1 g/dL (ref 3.6–5.1)
Alkaline phosphatase (APISO): 60 U/L (ref 31–125)
BUN: 12 mg/dL (ref 7–25)
CO2: 25 mmol/L (ref 20–32)
Calcium: 9.3 mg/dL (ref 8.6–10.2)
Chloride: 103 mmol/L (ref 98–110)
Creat: 0.72 mg/dL (ref 0.50–1.10)
GFR, Est African American: 119 mL/min/{1.73_m2} (ref >=60)
GFR, Est Non African American: 103 mL/min/{1.73_m2} (ref >=60)
Globulin: 2.6 g/dL (calc) (ref 1.9–3.7)
Glucose, Bld: 84 mg/dL (ref 65–99)
Potassium: 4.3 mmol/L (ref 3.5–5.3)
Sodium: 138 mmol/L (ref 135–146)
Total Bilirubin: 0.8 mg/dL (ref 0.2–1.2)
Total Protein: 6.7 g/dL (ref 6.1–8.1)

## 2021-01-19 LAB — CBC WITH DIFFERENTIAL/PLATELET
Absolute Monocytes: 583 cells/uL (ref 200–950)
Basophils Absolute: 34 cells/uL (ref 0–200)
Basophils Relative: 0.5 %
Eosinophils Absolute: 141 cells/uL (ref 15–500)
Eosinophils Relative: 2.1 %
HCT: 43.3 % (ref 35.0–45.0)
Hemoglobin: 14.8 g/dL (ref 11.7–15.5)
Lymphs Abs: 1755 cells/uL (ref 850–3900)
MCH: 31 pg (ref 27.0–33.0)
MCHC: 34.2 g/dL (ref 32.0–36.0)
MCV: 90.8 fL (ref 80.0–100.0)
MPV: 10.6 fL (ref 7.5–12.5)
Monocytes Relative: 8.7 %
Neutro Abs: 4188 cells/uL (ref 1500–7800)
Neutrophils Relative %: 62.5 %
Platelets: 233 10*3/uL (ref 140–400)
RBC: 4.77 10*6/uL (ref 3.80–5.10)
RDW: 12.5 % (ref 11.0–15.0)
Total Lymphocyte: 26.2 %
WBC: 6.7 10*3/uL (ref 3.8–10.8)

## 2021-01-19 LAB — TSH: TSH: 3.08 mIU/L

## 2021-01-20 ENCOUNTER — Ambulatory Visit (INDEPENDENT_AMBULATORY_CARE_PROVIDER_SITE_OTHER): Payer: BC Managed Care – PPO | Admitting: Obstetrics & Gynecology

## 2021-01-20 ENCOUNTER — Other Ambulatory Visit: Payer: Self-pay

## 2021-01-20 ENCOUNTER — Encounter: Payer: Self-pay | Admitting: Obstetrics & Gynecology

## 2021-01-20 VITALS — BP 120/70 | Ht 61.0 in | Wt 187.0 lb

## 2021-01-20 DIAGNOSIS — Z01419 Encounter for gynecological examination (general) (routine) without abnormal findings: Secondary | ICD-10-CM | POA: Diagnosis not present

## 2021-01-20 DIAGNOSIS — Z1231 Encounter for screening mammogram for malignant neoplasm of breast: Secondary | ICD-10-CM | POA: Diagnosis not present

## 2021-01-20 NOTE — Patient Instructions (Addendum)
PAP every three years Mammogram every year    Call [phone] to schedule at [facility] (after Mar 10) Labs yearly (with PCP)  Thank you for choosing [facility]. As part of our ongoing efforts to improve patient experience, we would appreciate your feedback. Please fill out the short survey that you will receive by mail or MyChart. Your opinion is important to [place]! - Dr. Kenton Kingfisher  Recommendations to boost your immunity to prevent illness such as viral flu and colds, including covid19, are as follows:       - - -  Vitamin K2 and Vitamin D3  - - - Take Vitamin K2 at 200-300 mcg daily (usually 2-3 pills daily of the over the counter formulation). Take Vitamin D3 at 3000-4000 U daily (usually 3-4 pills daily of the over the counter formulation). Studies show that these two at high normal levels in your system are very effective in keeping your immunity so strong and protective that you will be unlikely to contract viral illness such as those listed above.  Dr Kenton Kingfisher

## 2021-01-20 NOTE — Progress Notes (Signed)
HPI:      [name] is a 45 y.o. B3A1937 who LMP was No LMP recorded. Patient has had an implant., she presents today for her annual examination. The patient has no complaints today. The patient is sexually active. Her last pap: approximate date 2021 and was normal and last mammogram: approximate date 2021 and was normal. The patient does perform self breast exams.  There is no notable family history of breast or ovarian cancer in her family.  The patient has regular exercise: yes.  The patient denies current symptoms of depression.    GYN History: Contraception: Nexplanon since 11/2018  PMHx: Past Medical History:  Diagnosis Date  . Allergic rhinitis   . Anxiety   . Breast mass, right 11/11/2017   Nov 09, 2017, add'l views ordered  . Chest pain    a. 12/2015 ETT: Ex time: 8:00, Max HR 169 (92%), BP 201/71, METS 10.1, No ecg changes.  Marland Kitchen Dyspnea on exertion    a. 12/2015 Echo: EF 60-65%, no rwma.  . Gastritis   . Hepatitis B   . OM (otitis media), recurrent    as a child; ruptured TM  . Pneumonia 2007   hospitalized for possible pneumonia in [place] for 2 weeks  . Recurrent sinusitis    Past Surgical History:  Procedure Laterality Date  . fallopian tube removal Left March 2014  . INNER EAR SURGERY    . LAPAROSCOPIC OVARIAN CYSTECTOMY  2008   in [place]  . REFRACTIVE SURGERY Bilateral 1998  . TYMPANOPLASTY WITH GRAFT Left 2017   [place], Dr. Ned Grace   Family History  Problem Relation Age of Onset  . Hypertension Mother   . Heart disease Mother   . CAD Mother   . Heart attack Mother   . Arthritis Mother   . CAD Father   . Heart disease Father   . Glaucoma Father   . Cancer Father        kidney  . Hypertension Brother   . Breast cancer Paternal Aunt        late 24's- early 19's  . Cancer Maternal Grandfather        unknown  . Stroke Paternal Grandmother   . Cancer Paternal Grandfather        lung?  Marland Kitchen COPD Neg Hx   . Diabetes Neg Hx    Social History    Tobacco Use  . Smoking status: Never Smoker  . Smokeless tobacco: Never Used  Vaping Use  . Vaping Use: Never used  Substance Use Topics  . Alcohol use: Not Currently  . Drug use: No    Current Outpatient Medications:  .  etonogestrel (NEXPLANON) 68 MG IMPL implant, 1 each by Subdermal route once., Disp: , Rfl:  .  hydrochlorothiazide (HYDRODIURIL) 12.5 MG tablet, Take 1 tablet (12.5 mg total) by mouth daily., Disp: 90 tablet, Rfl: 3 .  loratadine (CLARITIN) 10 MG tablet, Take 10 mg by mouth daily. (Patient not taking: Reported on 01/20/2021), Disp: , Rfl:  Allergies: Procaine  Review of Systems  Constitutional: Negative for chills, fever and malaise/fatigue.  HENT: Negative for congestion, sinus pain and sore throat.   Eyes: Negative for blurred vision and pain.  Respiratory: Negative for cough and wheezing.   Cardiovascular: Negative for chest pain and leg swelling.  Gastrointestinal: Negative for abdominal pain, constipation, diarrhea, heartburn, nausea and vomiting.  Genitourinary: Negative for dysuria, frequency, hematuria and urgency.  Musculoskeletal: Negative for back pain, joint pain, myalgias  and neck pain.  Skin: Negative for itching and rash.  Neurological: Negative for dizziness, tremors and weakness.  Endo/Heme/Allergies: Does not bruise/bleed easily.  Psychiatric/Behavioral: Negative for depression. The patient is not nervous/anxious and does not have insomnia.     Objective: BP 120/70   Ht 5\' 1"  (1.549 m)   Wt 187 lb (84.8 kg)   BMI 35.33 kg/m   Filed Weights   01/20/21 0858  Weight: 187 lb (84.8 kg)   Body mass index is 35.33 kg/m. Physical Exam Constitutional:      General: She is not in acute distress.    Appearance: She is well-developed and well-nourished.  Genitourinary:     Vagina, uterus and rectum normal.     There is no rash or lesion on the right labia.     There is no rash or lesion on the left labia.    No lesions in the vagina.     No  vaginal bleeding.      Right Adnexa: not tender and no mass present.    Left Adnexa: not tender and no mass present.    No cervical motion tenderness, friability, lesion or polyp.     Uterus is mobile.     Uterus is not enlarged.     No uterine mass detected.    Uterus is midaxial.     Pelvic exam was performed with patient supine.  Breasts:     Right: No mass, skin change or tenderness.     Left: No mass, skin change or tenderness.    HENT:     Head: Normocephalic and atraumatic. No laceration.     Right Ear: Hearing normal.     Left Ear: Hearing normal.     Nose: No epistaxis or foreign body.     Mouth/Throat:     Mouth: Oropharynx is clear and moist and mucous membranes are normal.     Pharynx: Uvula midline.  Eyes:     Pupils: Pupils are equal, round, and reactive to light.  Neck:     Thyroid: No thyromegaly.  Cardiovascular:     Rate and Rhythm: Normal rate and regular rhythm.     Heart sounds: No murmur heard. No friction rub. No gallop.   Pulmonary:     Effort: Pulmonary effort is normal. No respiratory distress.     Breath sounds: Normal breath sounds. No wheezing.  Abdominal:     General: Bowel sounds are normal. There is no distension.     Palpations: Abdomen is soft.     Tenderness: There is no abdominal tenderness. There is no rebound.  Musculoskeletal:        General: Normal range of motion.     Cervical back: Normal range of motion and neck supple.  Neurological:     Mental Status: She is alert and oriented to person, place, and time.     Cranial Nerves: No cranial nerve deficit.  Skin:    General: Skin is warm and dry.  Psychiatric:        Mood and Affect: Mood and affect normal.        Judgment: Judgment normal.  Vitals reviewed.     Assessment:  ANNUAL EXAM 1. Women's annual routine gynecological examination   2. Encounter for screening mammogram for malignant neoplasm of breast      Screening Plan:            1.  Cervical Screening-  Pap  smear schedule reviewed with patient  2.  Breast screening- Exam annually and mammogram>40 planned   3. Colonoscopy every 10 years, Hemoccult testing - after age 55  4. Labs managed by PCP  5. Counseling for contraception: Cont Nexplanon  The pregnancy intention screening data noted above was reviewed. Potential methods of contraception were discussed.  Alternatives when the 3 years is up for Nexplanon.  Considering her dx of HTN, would not consider Estrogen options.  The patient elected to proceed with either Nexplanon again or  IUD or IUS.  Perhaps even Paraguard to see how she does (mood SE) off of hormonal therapy.     F/U  Return in about 1 year (around 01/20/2022) for Annual.  Barnett Applebaum, MD, [name], [facility] 01/20/2021  9:25 AM

## 2021-01-21 ENCOUNTER — Encounter: Payer: Self-pay | Admitting: Family Medicine

## 2021-01-21 ENCOUNTER — Ambulatory Visit (INDEPENDENT_AMBULATORY_CARE_PROVIDER_SITE_OTHER): Payer: BC Managed Care – PPO | Admitting: Family Medicine

## 2021-01-21 VITALS — BP 124/75 | HR 80 | Ht 61.0 in | Wt 187.2 lb

## 2021-01-21 DIAGNOSIS — Z0000 Encounter for general adult medical examination without abnormal findings: Secondary | ICD-10-CM

## 2021-01-21 DIAGNOSIS — Z6835 Body mass index (BMI) 35.0-35.9, adult: Secondary | ICD-10-CM

## 2021-01-21 DIAGNOSIS — I10 Essential (primary) hypertension: Secondary | ICD-10-CM

## 2021-01-21 NOTE — Progress Notes (Signed)
Established Patient Office Visit  SUBJECTIVE:  Subjective  Patient ID: Laura Espinoza, female    DOB: Feb 04, 1976  Age: 45 y.o. MRN: [id number]  CC:  Chief Complaint  Patient presents with  . Annual Exam    HPI Laura Espinoza is a 45 y.o. female presenting today for     Past Medical History:  Diagnosis Date  . Allergic rhinitis   . Anxiety   . Breast mass, right 11/11/2017   Nov 09, 2017, add'l views ordered  . Chest pain    a. 12/2015 ETT: Ex time: 8:00, Max HR 169 (92%), BP 201/71, METS 10.1, No ecg changes.  Marland Kitchen Dyspnea on exertion    a. 12/2015 Echo: EF 60-65%, no rwma.  . Gastritis   . Hepatitis B   . OM (otitis media), recurrent    as a child; ruptured TM  . Pneumonia 2007   hospitalized for possible pneumonia in [place] for 2 weeks  . Recurrent sinusitis     Past Surgical History:  Procedure Laterality Date  . fallopian tube removal Left March 2014  . INNER EAR SURGERY    . LAPAROSCOPIC OVARIAN CYSTECTOMY  2008   in [place]  . REFRACTIVE SURGERY Bilateral 1998  . TYMPANOPLASTY WITH GRAFT Left 2017   [place], Dr. Ned Grace    Family History  Problem Relation Age of Onset  . Hypertension Mother   . Heart disease Mother   . CAD Mother   . Heart attack Mother   . Arthritis Mother   . CAD Father   . Heart disease Father   . Glaucoma Father   . Cancer Father        kidney  . Hypertension Brother   . Breast cancer Paternal Aunt        late 18's- early 21's  . Cancer Maternal Grandfather        unknown  . Stroke Paternal Grandmother   . Cancer Paternal Grandfather        lung?  Marland Kitchen COPD Neg Hx   . Diabetes Neg Hx     Social History   Socioeconomic History  . Marital status: Married    Spouse name: Not on file  . Number of children: Not on file  . Years of education: Not on file  . Highest education level: Not on file  Occupational History  . Not on file  Tobacco Use  . Smoking status: Never Smoker  . Smokeless tobacco: Never Used  Vaping Use  .  Vaping Use: Never used  Substance and Sexual Activity  . Alcohol use: Not Currently  . Drug use: No  . Sexual activity: Not Currently    Birth control/protection: Implant  Other Topics Concern  . Not on file  Social History Narrative  . Not on file   Social Determinants of Health   Financial Resource Strain: Not on file  Food Insecurity: Not on file  Transportation Needs: Not on file  Physical Activity: Not on file  Stress: Not on file  Social Connections: Not on file  Intimate Partner Violence: Not on file     Current Outpatient Medications:  .  etonogestrel (NEXPLANON) 68 MG IMPL implant, 1 each by Subdermal route once., Disp: , Rfl:  .  hydrochlorothiazide (HYDRODIURIL) 12.5 MG tablet, Take 1 tablet (12.5 mg total) by mouth daily., Disp: 90 tablet, Rfl: 3 .  loratadine (CLARITIN) 10 MG tablet, Take 10 mg by mouth daily. (Patient not taking: Reported on 01/20/2021), Disp: , Rfl:  Allergies  Allergen Reactions  . Procaine Anaphylaxis and Shortness Of Breath    ROS Review of Systems   OBJECTIVE:    Physical Exam  BP 124/75   Pulse 80   Ht 5\' 1"  (1.549 m)   Wt 187 lb 3.2 oz (84.9 kg)   BMI 35.37 kg/m  Wt Readings from Last 3 Encounters:  01/21/21 187 lb 3.2 oz (84.9 kg)  01/20/21 187 lb (84.8 kg)  01/07/21 183 lb (83 kg)    Health Maintenance Due  Topic Date Due  . Hepatitis C Screening  Never done    There are no preventive care reminders to display for this patient.  CBC Latest Ref Rng & Units 01/18/2021 10/24/2018 10/18/2018  WBC 3.8 - 10.8 Thousand/uL 6.7 9.0 16.7(H)  Hemoglobin 11.7 - 15.5 g/dL 14.8 10.4(L) 11.8(L)  Hematocrit 35.0 - 45.0 % 43.3 30.8(L) 33.8(L)  Platelets 140 - 400 Thousand/uL 233 345 220   CMP Latest Ref Rng & Units 01/18/2021 10/24/2018 03/16/2016  Glucose 65 - 99 mg/dL 84 117(H) -  BUN 7 - 25 mg/dL 12 17 -  Creatinine 0.50 - 1.10 mg/dL 0.72 0.51 -  Sodium 135 - 146 mmol/L 138 141 -  Potassium 3.5 - 5.3 mmol/L 4.3 3.6 -  Chloride  98 - 110 mmol/L 103 112(H) -  CO2 20 - 32 mmol/L 25 21(L) -  Calcium 8.6 - 10.2 mg/dL 9.3 8.7(L) -  Total Protein 6.1 - 8.1 g/dL 6.7 5.8(L) 7.5  Total Bilirubin 0.2 - 1.2 mg/dL 0.8 0.5 0.5  Alkaline Phos 38 - 126 U/L - 125 71  AST 10 - 30 U/L 21 40 20  ALT 6 - 29 U/L 19 35 19    Lab Results  Component Value Date   TSH 3.08 01/18/2021   Lab Results  Component Value Date   ALBUMIN 3.0 (L) 10/24/2018   ANIONGAP 8 10/24/2018   Lab Results  Component Value Date   CHOL 172 01/18/2021   CHOL 172 01/01/2020   CHOL 207 (H) 11/17/2015   HDL 67 01/18/2021   HDL 56 01/01/2020   HDL 59 11/17/2015   LDLCALC 88 01/18/2021   LDLCALC 95 01/01/2020   LDLCALC 117 (H) 11/17/2015   CHOLHDL 2.6 01/18/2021   CHOLHDL 3.1 01/01/2020   Lab Results  Component Value Date   TRIG 83 01/18/2021   No results found for: HGBA1C    ASSESSMENT & PLAN:   Problem List Items Addressed This Visit      Cardiovascular and Mediastinum   Primary hypertension    Patient's blood pressure is within the desired range. Medication side effects include: no side effects noted Continue current treatment regimen.   ECG wnl, pt co of intermittent sharp CP left side chest through to her shoulder. Brought on by exertion.          Other   Encounter for annual physical exam    Colon Screening- NA Mammogram- March 2021, has 1 ordered now.  Pap- March 2021 TDAP- Unknown Shingles Vaccine- NA Flu Vaccine- 2021 Pneumonia Vaccine- NA  Overall health good, no acute problems today, Lipid panel and all other labs were unremarkable.          BMI 35.0-35.9,adult - Primary    Patient started weight watchers back, she has a 68.16 year old child. Plan- Decreased carbohydrate and caloric intake. Exercise- none at this time.          No orders of the defined types were placed in this encounter.  Follow-up: No follow-ups on file.    Beckie Salts, [place] [address],  [place], [place] [address]

## 2021-01-21 NOTE — Assessment & Plan Note (Addendum)
Patient's blood pressure is within the desired range. Medication side effects include: no side effects noted Continue current treatment regimen.   ECG wnl, pt co of intermittent sharp CP left side chest through to her shoulder. Brought on by exertion.

## 2021-01-21 NOTE — Assessment & Plan Note (Signed)
Patient started weight watchers back, she has a 46.44 year old child. Plan- Decreased carbohydrate and caloric intake. Exercise- none at this time.

## 2021-01-21 NOTE — Assessment & Plan Note (Signed)
Colon Screening- NA Mammogram- March 2021, has 1 ordered now.  Pap- March 2021 TDAP- Unknown Shingles Vaccine- NA Flu Vaccine- 2021 Pneumonia Vaccine- NA  Overall health good, no acute problems today, Lipid panel and all other labs were unremarkable.

## 2021-01-31 ENCOUNTER — Other Ambulatory Visit: Payer: Self-pay | Admitting: Family Medicine

## 2021-03-07 IMAGING — US ULTRASOUND ABDOMEN COMPLETE
1 series · 14 of 25 positions shown · non-contrast
Comparison: April 26, 2018

CLINICAL DATA: Chronic hepatitis-B

EXAM:
ABDOMEN ULTRASOUND COMPLETE

[Series 1: ultrasound abdomen complete · 0.19mm/px · 14 of 105 slices shown]
[im 1/105]
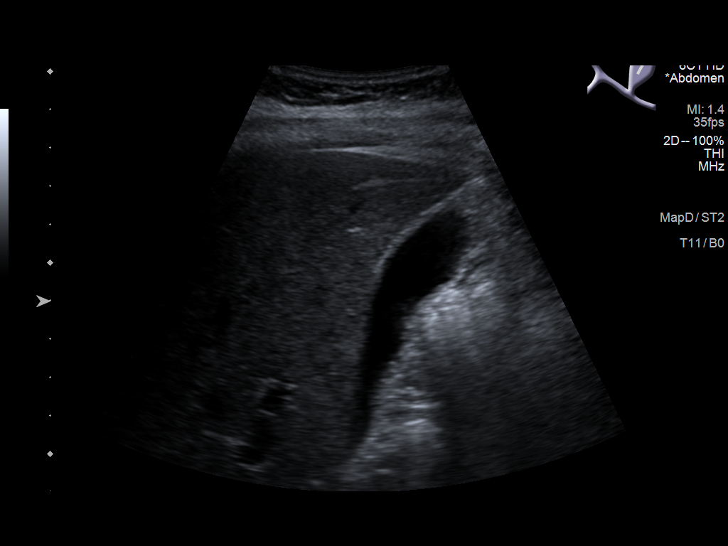
[im 9/105]
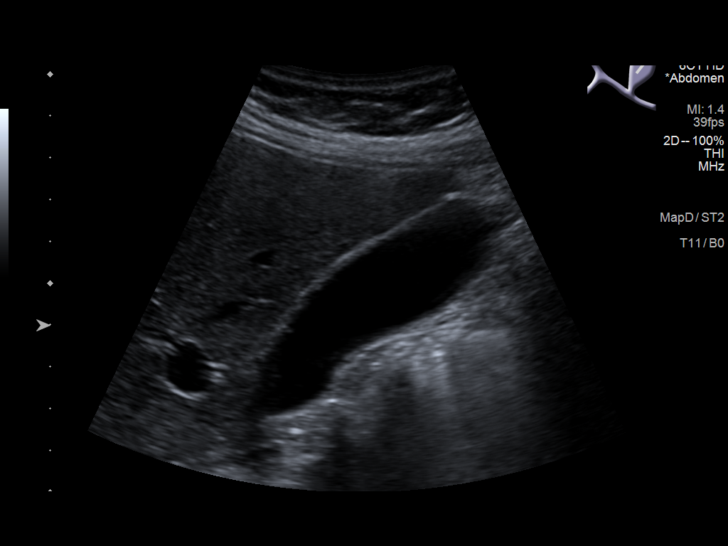
[im 18/105]
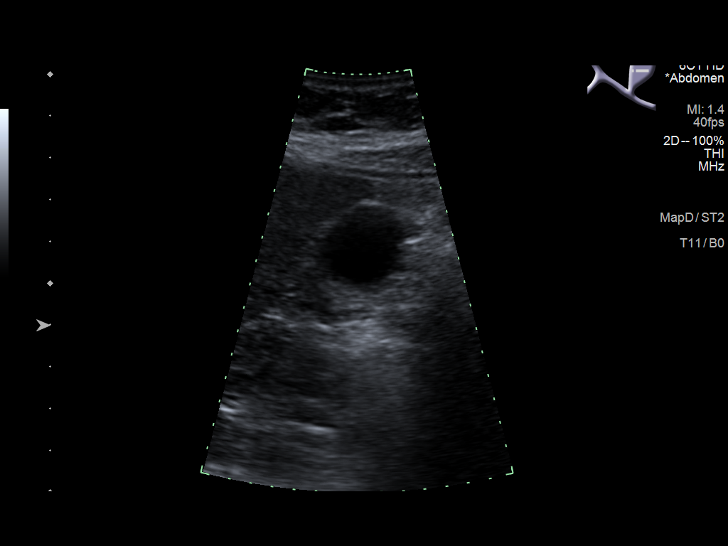
[im 27/105]
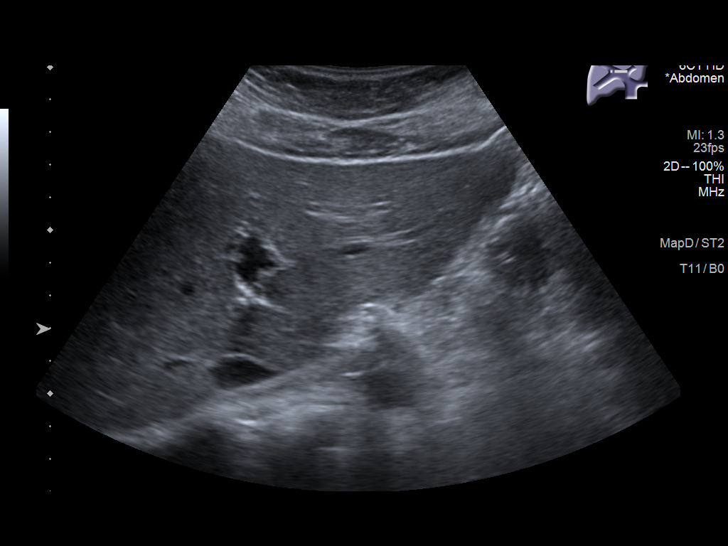
[im 35/105]
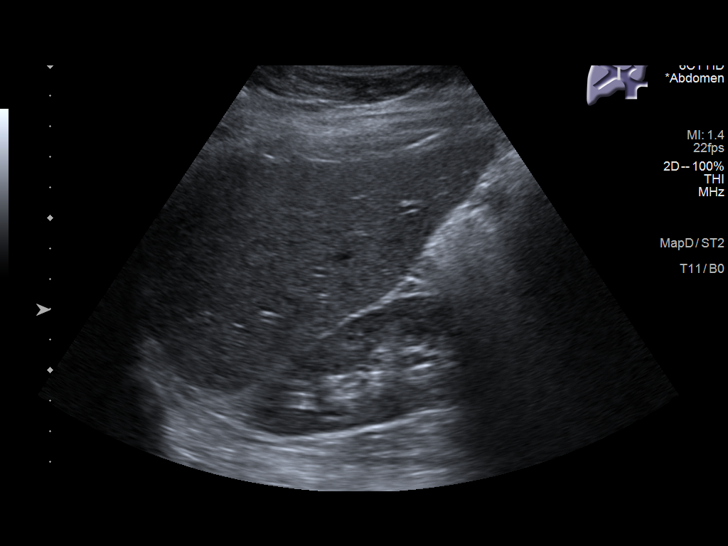
[im 40/105]
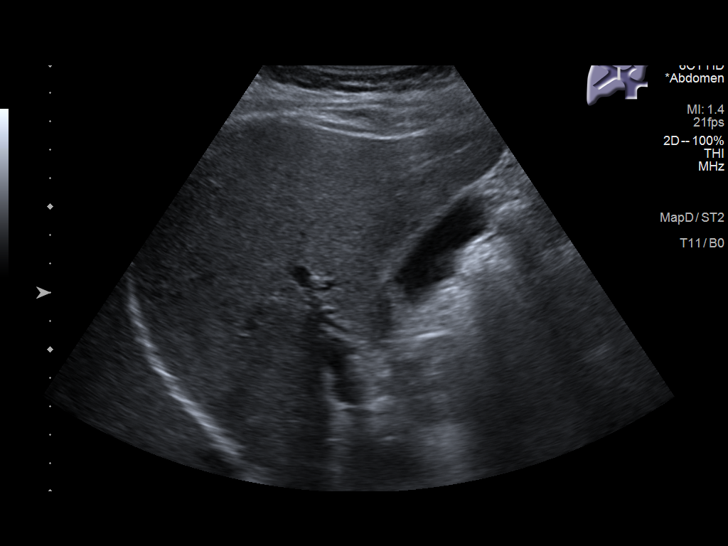
[im 48/105]
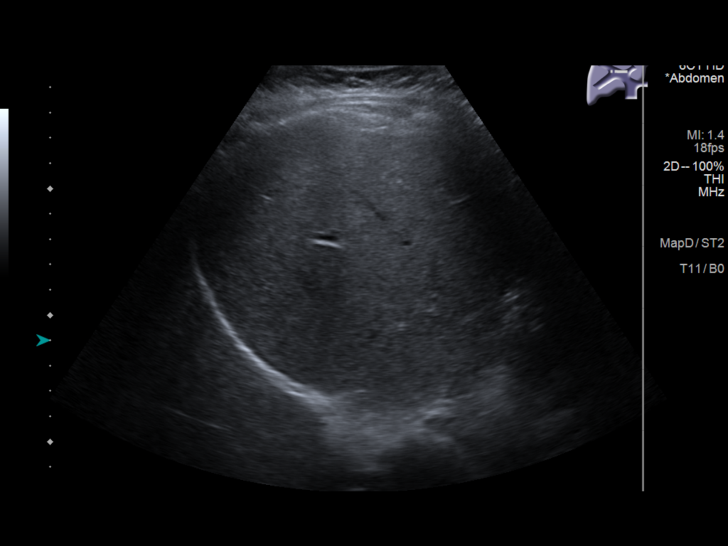
[im 57/105]
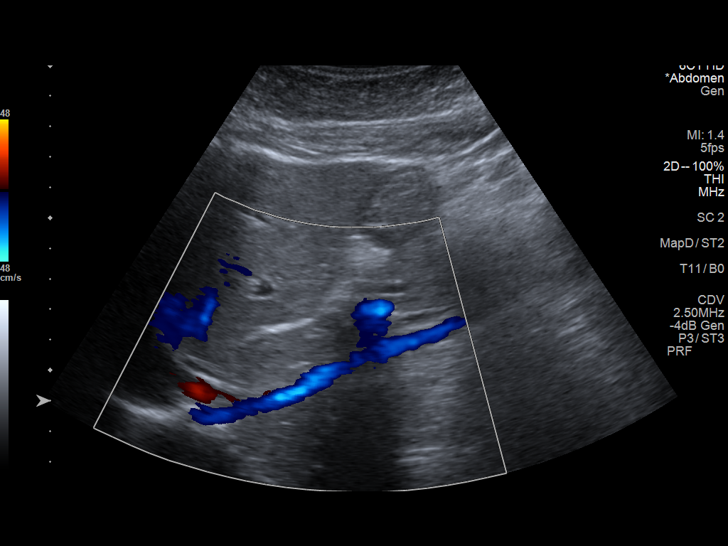
[im 66/105]
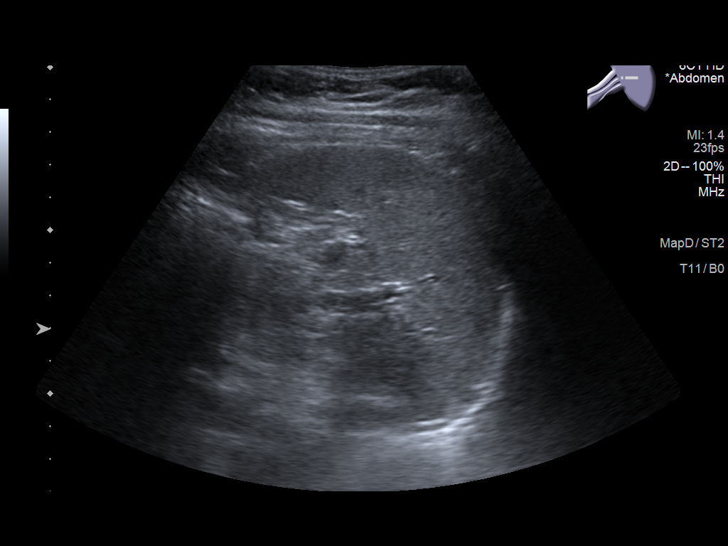
[im 70/105]
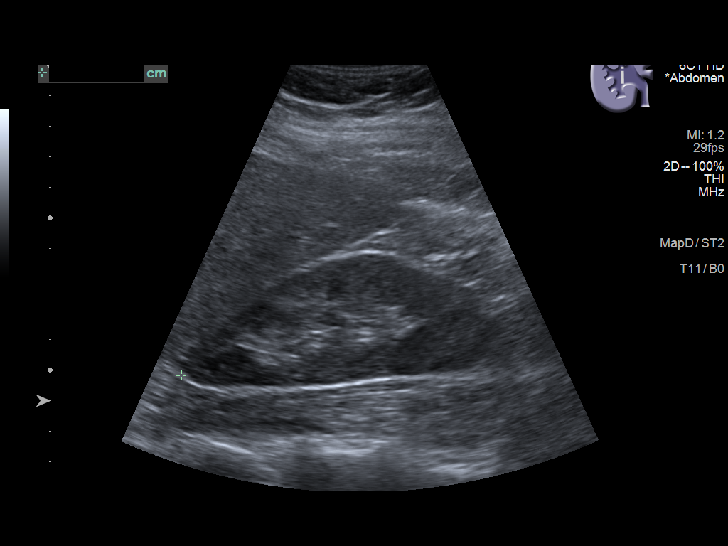
[im 79/105]
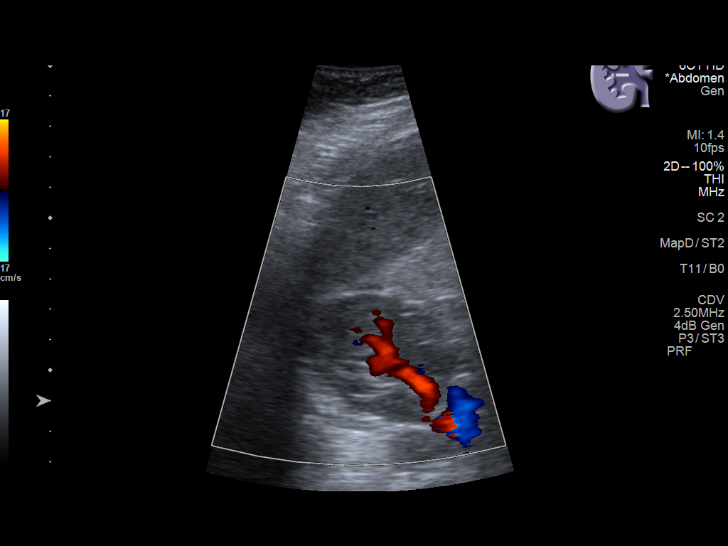
[im 87/105]
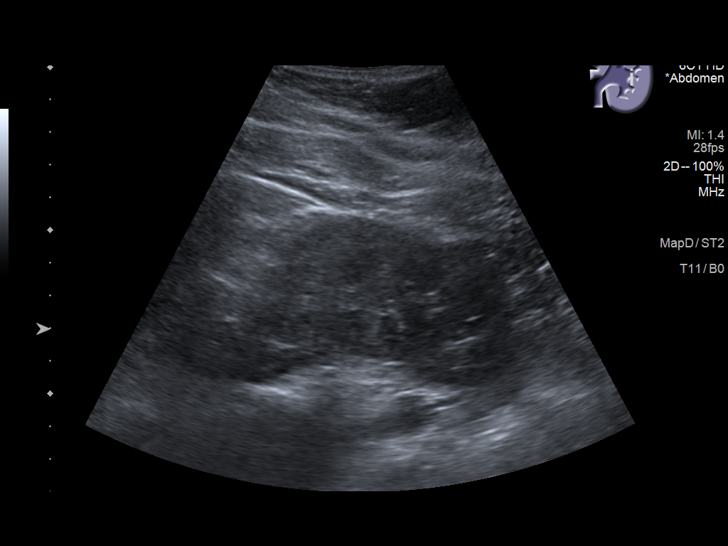
[im 96/105]
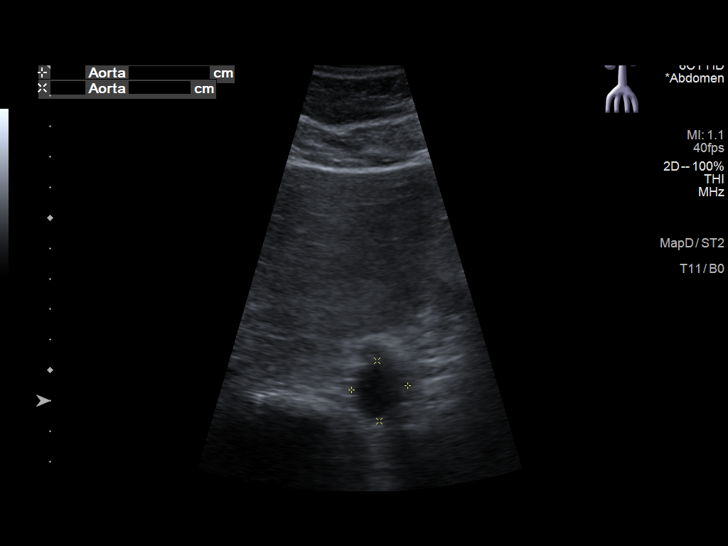
[im 105/105]
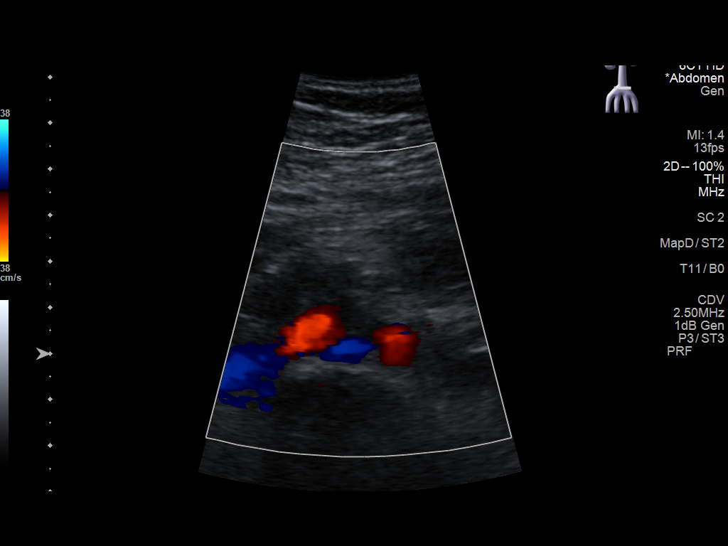

[14 of 25 positions shown; findings below may reference images not displayed]

FINDINGS: Gallbladder: No gallstones or wall thickening visualized. There is
no pericholecystic fluid. No sonographic Murphy sign noted by
sonographer.

Common bile duct: Diameter: 3 mm. No intrahepatic, common hepatic,
or common bile duct dilatation.

Liver: No focal lesion identified. Within normal limits in
parenchymal echogenicity. Portal vein is patent on color Doppler
imaging with normal direction of blood flow towards the liver.

IVC: No abnormality visualized.

Pancreas: No pancreatic mass or inflammatory focus evident.

Spleen: Size and appearance within normal limits.

Right Kidney: Length: 10.5 cm. Echogenicity within normal limits. No
mass or hydronephrosis visualized.

Left Kidney: Length: 9.5 cm. Echogenicity within normal limits. No
mass or hydronephrosis visualized.

Abdominal aorta: No aneurysm visualized.

Other findings: No demonstrable ascites.
IMPRESSION: Study within normal limits. In particular, no focal liver lesions
are demonstrable. Liver echogenicity overall is considered within
normal limits.

## 2021-03-21 ENCOUNTER — Other Ambulatory Visit: Payer: Self-pay

## 2021-03-21 ENCOUNTER — Ambulatory Visit
Admission: RE | Admit: 2021-03-21 | Discharge: 2021-03-21 | Disposition: A | Discharge status: Home / Self Care | Payer: BC Managed Care – PPO | Source: Ambulatory Visit | Attending: Obstetrics & Gynecology | Admitting: Obstetrics & Gynecology

## 2021-03-21 ENCOUNTER — Other Ambulatory Visit: Payer: Self-pay | Admitting: Obstetrics & Gynecology

## 2021-03-21 DIAGNOSIS — Z1231 Encounter for screening mammogram for malignant neoplasm of breast: Secondary | ICD-10-CM | POA: Diagnosis not present

## 2021-03-21 DIAGNOSIS — N6310 Unspecified lump in the right breast, unspecified quadrant: Secondary | ICD-10-CM

## 2021-03-21 DIAGNOSIS — R928 Other abnormal and inconclusive findings on diagnostic imaging of breast: Secondary | ICD-10-CM

## 2021-04-01 ENCOUNTER — Ambulatory Visit
Admission: RE | Admit: 2021-04-01 | Discharge: 2021-04-01 | Disposition: A | Discharge status: Home / Self Care | Payer: BC Managed Care – PPO | Source: Ambulatory Visit | Attending: Obstetrics & Gynecology | Admitting: Obstetrics & Gynecology

## 2021-04-01 ENCOUNTER — Other Ambulatory Visit: Payer: Self-pay

## 2021-04-01 DIAGNOSIS — R928 Other abnormal and inconclusive findings on diagnostic imaging of breast: Secondary | ICD-10-CM | POA: Insufficient documentation

## 2021-04-01 DIAGNOSIS — N6310 Unspecified lump in the right breast, unspecified quadrant: Secondary | ICD-10-CM | POA: Insufficient documentation

## 2021-04-21 ENCOUNTER — Encounter: Payer: Self-pay | Admitting: Family Medicine

## 2021-04-21 ENCOUNTER — Other Ambulatory Visit: Payer: Self-pay

## 2021-04-21 ENCOUNTER — Ambulatory Visit: Payer: BC Managed Care – PPO | Admitting: Family Medicine

## 2021-04-21 VITALS — BP 135/84 | HR 82 | Ht 61.0 in | Wt 184.5 lb

## 2021-04-21 DIAGNOSIS — I10 Essential (primary) hypertension: Secondary | ICD-10-CM | POA: Diagnosis not present

## 2021-04-21 DIAGNOSIS — M85461 Solitary bone cyst, right tibia and fibula: Secondary | ICD-10-CM | POA: Insufficient documentation

## 2021-04-21 DIAGNOSIS — Z6835 Body mass index (BMI) 35.0-35.9, adult: Secondary | ICD-10-CM

## 2021-04-21 DIAGNOSIS — D367 Benign neoplasm of other specified sites: Secondary | ICD-10-CM | POA: Insufficient documentation

## 2021-04-21 NOTE — Assessment & Plan Note (Signed)
Patient describes cyst mid thigh since her last pregnancy several years ago that is sometimes tender but never sever.  A/P- Cyst is solid and movable, likely benign. Discussed risk vs benefits of removal by surgery and decision made to observe for now.

## 2021-04-21 NOTE — Assessment & Plan Note (Signed)
Discussed weigh management, I gave a handout on the Med Diet and discussed, goal of 2-3 lbs per week until fu in 6 months.

## 2021-04-21 NOTE — Progress Notes (Signed)
Established Patient Office Visit  SUBJECTIVE:  Subjective  Patient ID: Laura Espinoza, female    DOB: 26-May-1976  Age: 45 y.o. MRN: [id number]  CC:  Chief Complaint  Patient presents with  . Follow-up    HPI Laura Espinoza is a 45 y.o. female presenting today for     Past Medical History:  Diagnosis Date  . Allergic rhinitis   . Anxiety   . Breast mass, right 11/11/2017   Nov 09, 2017, add'l views ordered  . Chest pain    a. 12/2015 ETT: Ex time: 8:00, Max HR 169 (92%), BP 201/71, METS 10.1, No ecg changes.  Marland Kitchen Dyspnea on exertion    a. 12/2015 Echo: EF 60-65%, no rwma.  . Gastritis   . Hepatitis B   . OM (otitis media), recurrent    as a child; ruptured TM  . Pneumonia 2007   hospitalized for possible pneumonia in [place] for 2 weeks  . Recurrent sinusitis     Past Surgical History:  Procedure Laterality Date  . fallopian tube removal Left March 2014  . INNER EAR SURGERY    . LAPAROSCOPIC OVARIAN CYSTECTOMY  2008   in [place]  . REFRACTIVE SURGERY Bilateral 1998  . TYMPANOPLASTY WITH GRAFT Left 2017   [place], Dr. Ned Grace    Family History  Problem Relation Age of Onset  . Hypertension Mother   . Heart disease Mother   . CAD Mother   . Heart attack Mother   . Arthritis Mother   . CAD Father   . Heart disease Father   . Glaucoma Father   . Cancer Father        kidney  . Hypertension Brother   . Breast cancer Paternal Aunt        late 41's- early 43's  . Cancer Maternal Grandfather        unknown  . Stroke Paternal Grandmother   . Cancer Paternal Grandfather        lung?  Marland Kitchen COPD Neg Hx   . Diabetes Neg Hx     Social History   Socioeconomic History  . Marital status: Married    Spouse name: Not on file  . Number of children: Not on file  . Years of education: Not on file  . Highest education level: Not on file  Occupational History  . Not on file  Tobacco Use  . Smoking status: Never Smoker  . Smokeless tobacco: Never Used  Vaping Use  .  Vaping Use: Never used  Substance and Sexual Activity  . Alcohol use: Not Currently  . Drug use: No  . Sexual activity: Not Currently    Birth control/protection: Implant  Other Topics Concern  . Not on file  Social History Narrative  . Not on file   Social Determinants of Health   Financial Resource Strain: Not on file  Food Insecurity: Not on file  Transportation Needs: Not on file  Physical Activity: Not on file  Stress: Not on file  Social Connections: Not on file  Intimate Partner Violence: Not on file     Current Outpatient Medications:  .  etonogestrel (NEXPLANON) 68 MG IMPL implant, 1 each by Subdermal route once., Disp: , Rfl:  .  hydrochlorothiazide (HYDRODIURIL) 12.5 MG tablet, Take 1 tablet (12.5 mg total) by mouth daily., Disp: 90 tablet, Rfl: 3 .  loratadine (CLARITIN) 10 MG tablet, Take 10 mg by mouth daily., Disp: , Rfl:    Allergies  Allergen Reactions  .  Procaine Anaphylaxis and Shortness Of Breath    ROS Review of Systems  Constitutional: Negative.   HENT: Negative.   Respiratory: Negative.   Cardiovascular: Negative.   Gastrointestinal: Negative.   Musculoskeletal: Negative.   Skin: Negative for color change and rash.  Psychiatric/Behavioral: Negative.      OBJECTIVE:    Physical Exam Vitals and nursing note reviewed.  HENT:     Right Ear: Tympanic membrane normal.     Left Ear: Tympanic membrane normal.     Mouth/Throat:     Mouth: Mucous membranes are moist.  Cardiovascular:     Rate and Rhythm: Normal rate and regular rhythm.  Pulmonary:     Effort: Pulmonary effort is normal.  Musculoskeletal:     Cervical back: Normal range of motion.  Skin:    General: Skin is warm.     Capillary Refill: Capillary refill takes less than 2 seconds.     Findings: Lesion present.          Comments: Cyst solid and movable, size of large marble approx 25 mm.   Neurological:     Mental Status: She is alert.     BP 135/84   Pulse 82   Ht 5'  1" (1.549 m)   Wt 184 lb 8 oz (83.7 kg)   BMI 34.86 kg/m  Wt Readings from Last 3 Encounters:  04/21/21 184 lb 8 oz (83.7 kg)  01/21/21 187 lb 3.2 oz (84.9 kg)  01/20/21 187 lb (84.8 kg)    Health Maintenance Due  Topic Date Due  . Hepatitis C Screening  Never done  . COVID-19 Vaccine (3 - Booster for Pfizer series) 09/07/2020    There are no preventive care reminders to display for this patient.  CBC Latest Ref Rng & Units 01/18/2021 10/24/2018 10/18/2018  WBC 3.8 - 10.8 Thousand/uL 6.7 9.0 16.7(H)  Hemoglobin 11.7 - 15.5 g/dL 14.8 10.4(L) 11.8(L)  Hematocrit 35.0 - 45.0 % 43.3 30.8(L) 33.8(L)  Platelets 140 - 400 Thousand/uL 233 345 220   CMP Latest Ref Rng & Units 01/18/2021 10/24/2018 03/16/2016  Glucose 65 - 99 mg/dL 84 117(H) -  BUN 7 - 25 mg/dL 12 17 -  Creatinine 0.50 - 1.10 mg/dL 0.72 0.51 -  Sodium 135 - 146 mmol/L 138 141 -  Potassium 3.5 - 5.3 mmol/L 4.3 3.6 -  Chloride 98 - 110 mmol/L 103 112(H) -  CO2 20 - 32 mmol/L 25 21(L) -  Calcium 8.6 - 10.2 mg/dL 9.3 8.7(L) -  Total Protein 6.1 - 8.1 g/dL 6.7 5.8(L) 7.5  Total Bilirubin 0.2 - 1.2 mg/dL 0.8 0.5 0.5  Alkaline Phos 38 - 126 U/L - 125 71  AST 10 - 30 U/L 21 40 20  ALT 6 - 29 U/L 19 35 19    Lab Results  Component Value Date   TSH 3.08 01/18/2021   Lab Results  Component Value Date   ALBUMIN 3.0 (L) 10/24/2018   ANIONGAP 8 10/24/2018   Lab Results  Component Value Date   CHOL 172 01/18/2021   CHOL 172 01/01/2020   CHOL 207 (H) 11/17/2015   HDL 67 01/18/2021   HDL 56 01/01/2020   HDL 59 11/17/2015   LDLCALC 88 01/18/2021   LDLCALC 95 01/01/2020   LDLCALC 117 (H) 11/17/2015   CHOLHDL 2.6 01/18/2021   CHOLHDL 3.1 01/01/2020   Lab Results  Component Value Date   TRIG 83 01/18/2021   No results found for: HGBA1C    ASSESSMENT &  PLAN:   Problem List Items Addressed This Visit      Cardiovascular and Mediastinum   Primary hypertension - Primary    Patient's blood pressure is within the  desired range. Medication side effects include: no side effects noted Continue current treatment regimen. Continue current medications.         Musculoskeletal and Integument   Solitary bone cyst of lower leg, right    Patient describes cyst mid thigh since her last pregnancy several years ago that is sometimes tender but never sever.  A/P- Cyst is solid and movable, likely benign. Discussed risk vs benefits of removal by surgery and decision made to observe for now.         Other   BMI 35.0-35.9,adult    Discussed weigh management, I gave a handout on the Med Diet and discussed, goal of 2-3 lbs per week until fu in 6 months.          No orders of the defined types were placed in this encounter.     Follow-up: No follow-ups on file.    Beckie Salts, [place] [facility] [address] [place], [place] [address]

## 2021-04-21 NOTE — Assessment & Plan Note (Signed)
Patient's blood pressure is within the desired range. Medication side effects include: no side effects noted Continue current treatment regimen. Continue current medications.

## 2021-08-30 ENCOUNTER — Telehealth: Payer: Self-pay

## 2021-08-30 NOTE — Telephone Encounter (Signed)
Patient is scheduled for 11/21/21 at 10 am with [facility] for nexplanon replacement

## 2021-08-31 NOTE — Telephone Encounter (Signed)
Noted. Will order to arrive by apt date/time. 

## 2021-09-01 IMAGING — US US ABDOMEN COMPLETE
1 series · 14 of 25 positions shown · non-contrast
Comparison: June 03, 2019

CLINICAL DATA: Chronic hepatitis-B

EXAM:
ABDOMEN ULTRASOUND COMPLETE

[Series 1: us abdomen complete · 0.14mm/px · 14 of 88 slices shown]
[im 1/88]
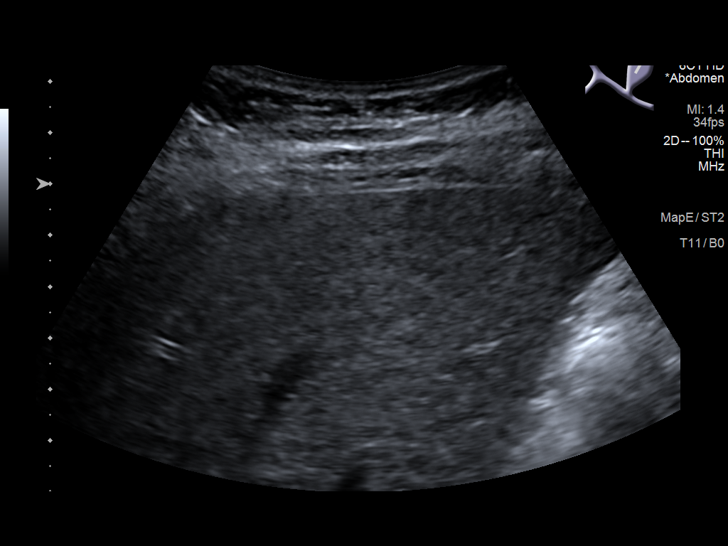
[im 8/88]
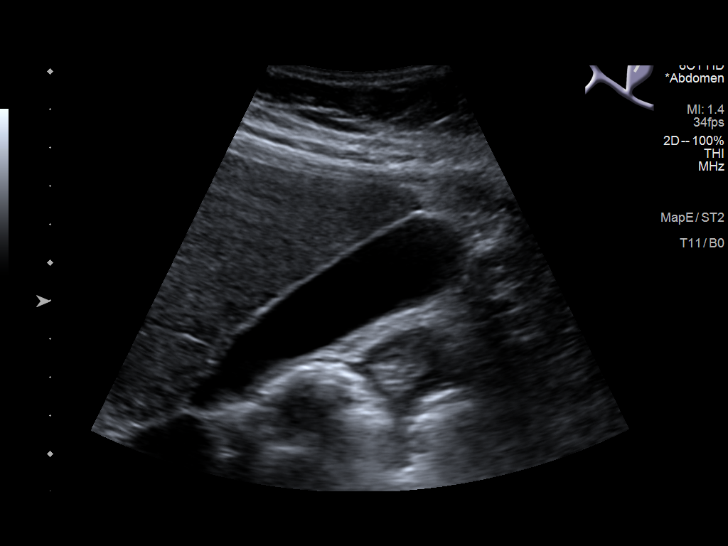
[im 15/88]
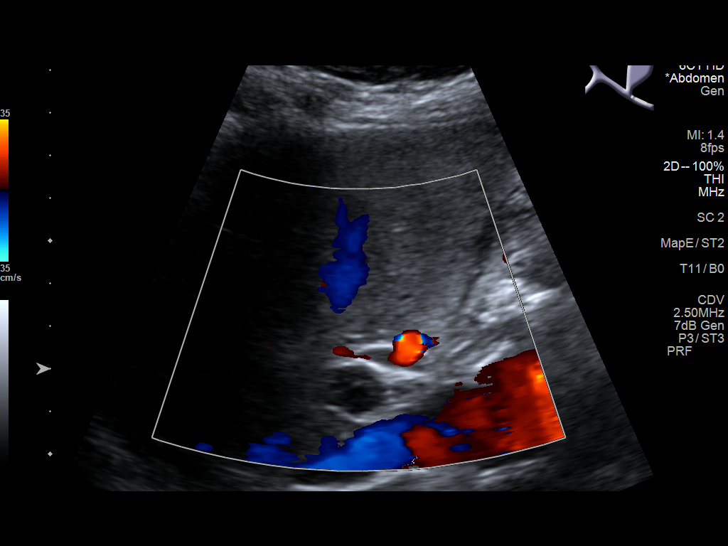
[im 22/88]
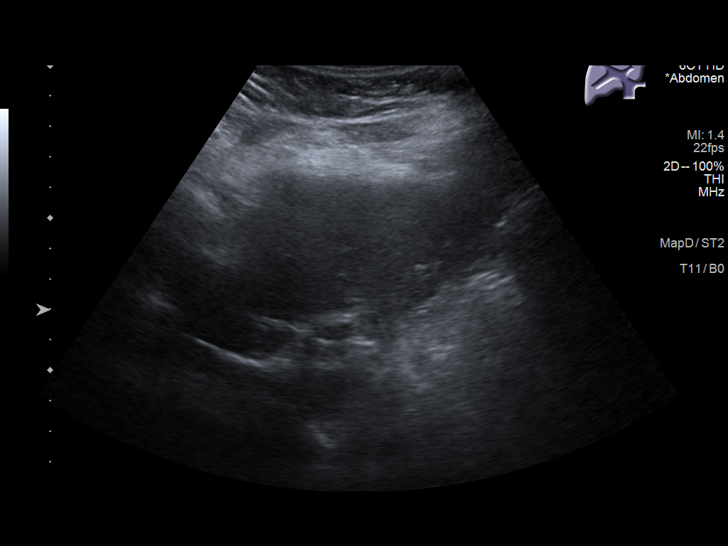
[im 30/88]
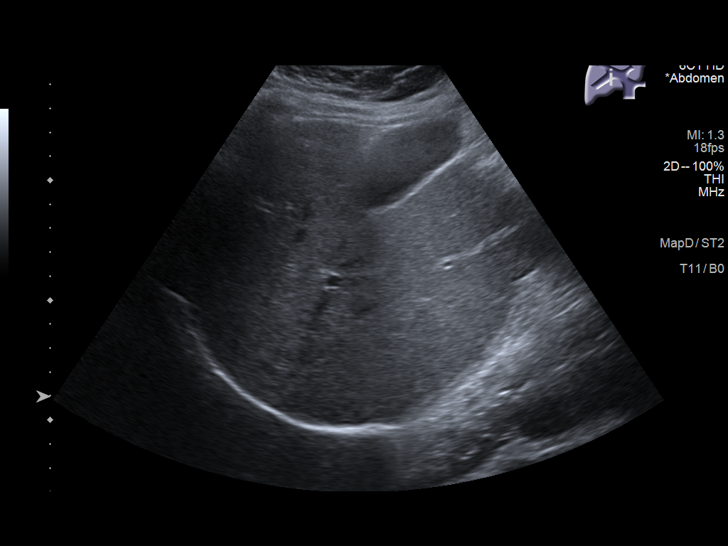
[im 33/88]
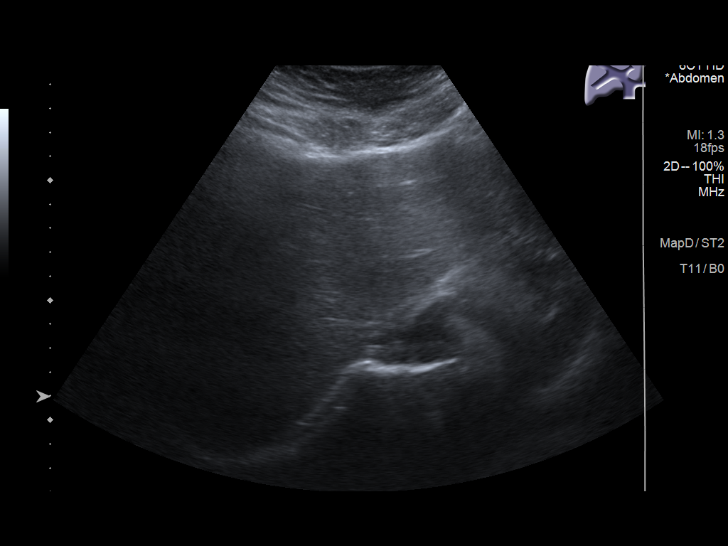
[im 40/88]
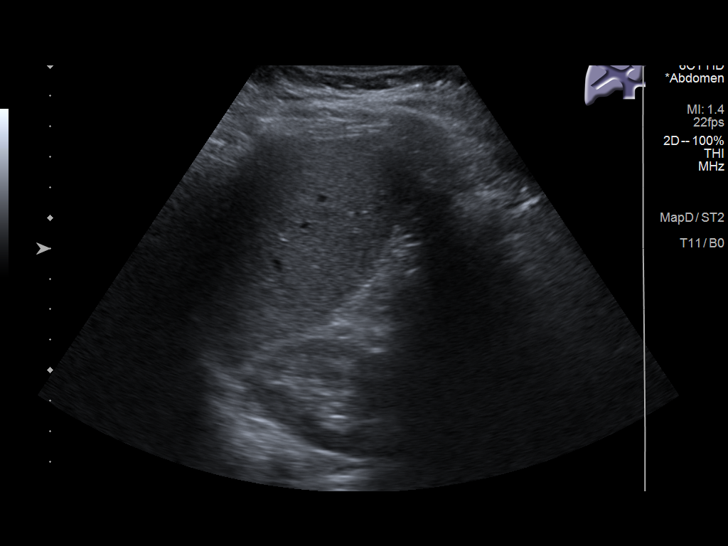
[im 48/88]
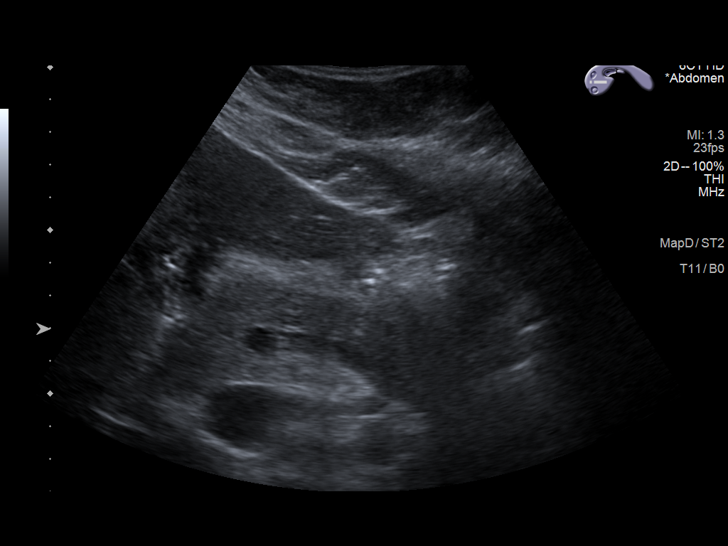
[im 55/88]
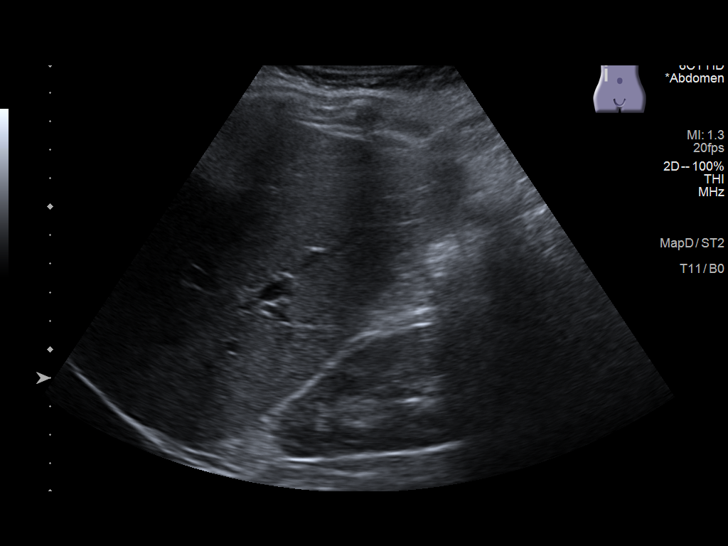
[im 59/88]
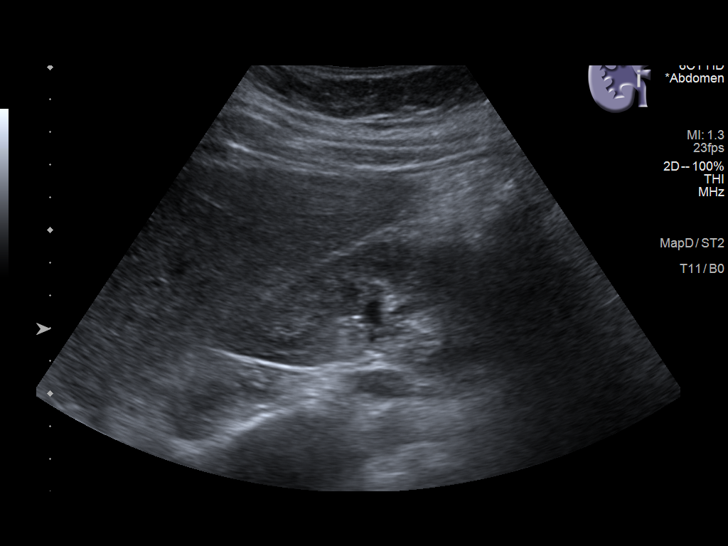
[im 66/88]
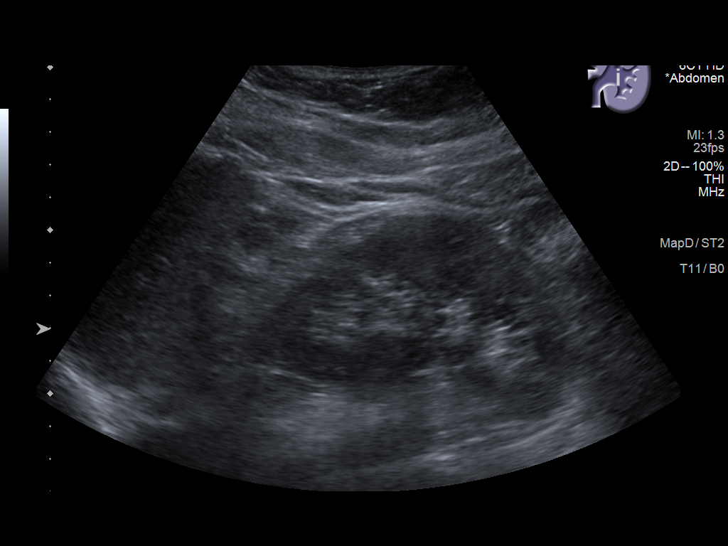
[im 73/88]
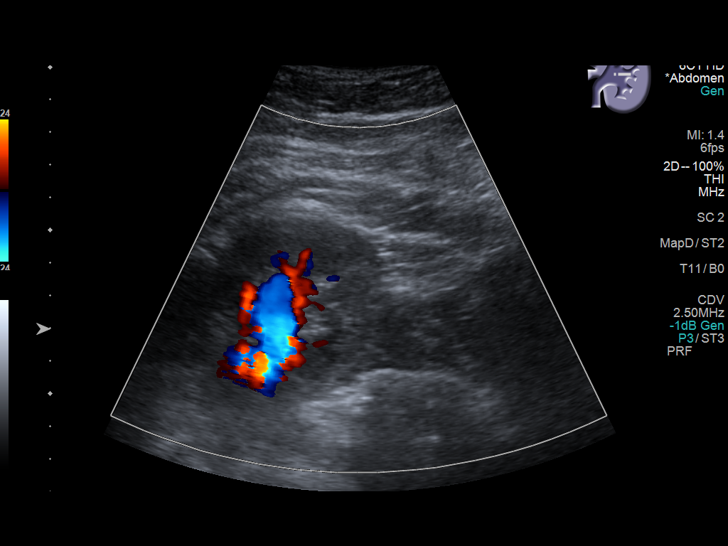
[im 80/88]
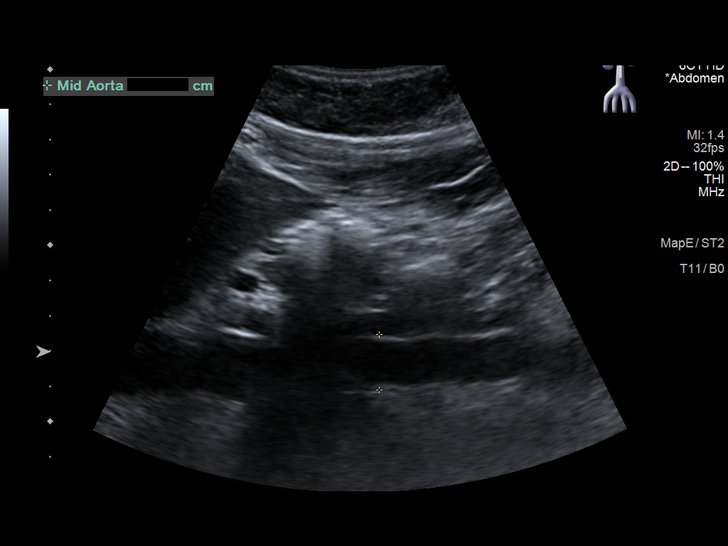
[im 88/88]
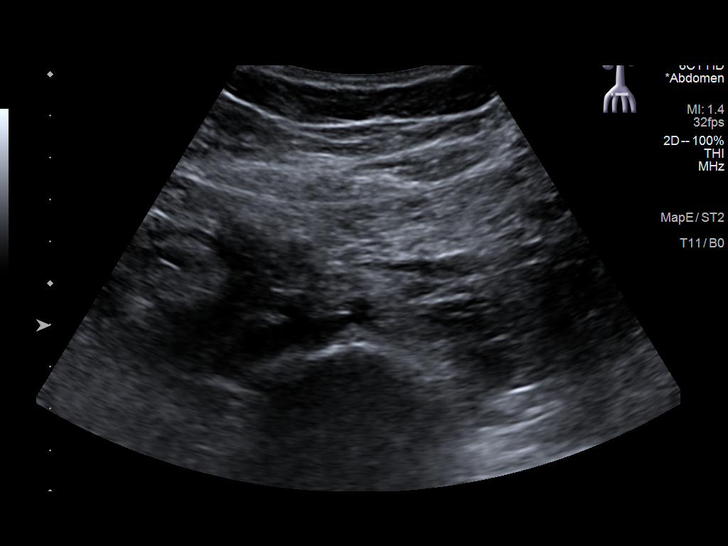

[14 of 25 positions shown; findings below may reference images not displayed]

FINDINGS: Gallbladder: No gallstones or wall thickening visualized. There is
no pericholecystic fluid. No sonographic Murphy sign noted by
sonographer.

Common bile duct: Diameter: 4 mm. No intrahepatic, common hepatic,
or common bile duct dilatation.

Liver: No focal lesion identified. Within normal limits in
parenchymal echogenicity. Portal vein is patent on color Doppler
imaging with normal direction of blood flow towards the liver.

IVC: No abnormality visualized.

Pancreas: No pancreatic mass or inflammatory focus.

Spleen: Size and appearance within normal limits.

Right Kidney: Length: 10.0 cm. Echogenicity within normal limits. No
mass or hydronephrosis visualized.

Left Kidney: Length: 9.2 cm. Echogenicity within normal limits. No
mass or hydronephrosis visualized.

Abdominal aorta: No aneurysm visualized.

Other findings: No demonstrable ascites.
IMPRESSION: Study within normal limits.

## 2021-09-27 ENCOUNTER — Other Ambulatory Visit: Payer: Self-pay | Admitting: Gastroenterology

## 2021-09-27 DIAGNOSIS — B181 Chronic viral hepatitis B without delta-agent: Secondary | ICD-10-CM | POA: Diagnosis not present

## 2021-10-04 ENCOUNTER — Other Ambulatory Visit: Payer: Self-pay

## 2021-10-04 ENCOUNTER — Ambulatory Visit
Admission: RE | Admit: 2021-10-04 | Discharge: 2021-10-04 | Disposition: A | Discharge status: Home / Self Care | Payer: BC Managed Care – PPO | Source: Ambulatory Visit | Attending: Gastroenterology | Admitting: Gastroenterology

## 2021-10-04 DIAGNOSIS — B181 Chronic viral hepatitis B without delta-agent: Secondary | ICD-10-CM | POA: Diagnosis not present

## 2021-10-04 DIAGNOSIS — B182 Chronic viral hepatitis C: Secondary | ICD-10-CM | POA: Diagnosis not present

## 2021-11-18 ENCOUNTER — Ambulatory Visit: Payer: BC Managed Care – PPO | Admitting: Internal Medicine

## 2021-11-18 ENCOUNTER — Encounter: Payer: Self-pay | Admitting: Internal Medicine

## 2021-11-18 ENCOUNTER — Other Ambulatory Visit: Payer: Self-pay

## 2021-11-18 ENCOUNTER — Ambulatory Visit: Payer: BC Managed Care – PPO | Admitting: Family Medicine

## 2021-11-18 VITALS — BP 136/77 | HR 71 | Ht 61.0 in | Wt 199.2 lb

## 2021-11-18 DIAGNOSIS — J4520 Mild intermittent asthma, uncomplicated: Secondary | ICD-10-CM

## 2021-11-18 DIAGNOSIS — G5602 Carpal tunnel syndrome, left upper limb: Secondary | ICD-10-CM

## 2021-11-18 DIAGNOSIS — I10 Essential (primary) hypertension: Secondary | ICD-10-CM

## 2021-11-18 DIAGNOSIS — K2930 Chronic superficial gastritis without bleeding: Secondary | ICD-10-CM

## 2021-11-18 DIAGNOSIS — B181 Chronic viral hepatitis B without delta-agent: Secondary | ICD-10-CM | POA: Diagnosis not present

## 2021-11-18 DIAGNOSIS — Z23 Encounter for immunization: Secondary | ICD-10-CM

## 2021-11-18 DIAGNOSIS — Z6835 Body mass index (BMI) 35.0-35.9, adult: Secondary | ICD-10-CM

## 2021-11-18 DIAGNOSIS — M546 Pain in thoracic spine: Secondary | ICD-10-CM

## 2021-11-18 DIAGNOSIS — G5601 Carpal tunnel syndrome, right upper limb: Secondary | ICD-10-CM

## 2021-11-18 MED ORDER — HYDROCHLOROTHIAZIDE 12.5 MG PO TABS
12.5000 mg | ORAL_TABLET | Freq: Every day | ORAL | 3 refills | Status: DC
Start: 2021-11-18 — End: 2022-07-12

## 2021-11-18 MED ORDER — AZITHROMYCIN 250 MG PO TABS: ORAL_TABLET | ORAL | 0 refills | Status: AC

## 2021-11-20 NOTE — Assessment & Plan Note (Signed)
Stable referral to GI

## 2021-11-20 NOTE — Assessment & Plan Note (Addendum)
she was advised to take the vaccine

## 2021-11-20 NOTE — Assessment & Plan Note (Signed)
Chronic problem refer to Ortho

## 2021-11-20 NOTE — Assessment & Plan Note (Signed)
Patient was advised to lose weight and walk on a daily basis 

## 2021-11-20 NOTE — Assessment & Plan Note (Signed)
Refer to Ortho?

## 2021-11-20 NOTE — Assessment & Plan Note (Signed)
Continue Prilosec 20 mg p.o. daily 

## 2021-11-20 NOTE — Progress Notes (Signed)
Established Patient Office Visit  Subjective:  Patient ID: KYMBERLIE BRAZEAU, female    DOB: Aug 09, 1976  Age: 45 y.o. MRN: [id number]  CC:  Chief Complaint  Patient presents with   Hypertension    Hypertension   Laura Espinoza presents for general checkup.  Shortness of breath on exertion and gastritis  Past Medical History:  Diagnosis Date   Allergic rhinitis    Anxiety    Breast mass, right 11/11/2017   Nov 09, 2017, add'l views ordered   Chest pain    a. 12/2015 ETT: Ex time: 8:00, Max HR 169 (92%), BP 201/71, METS 10.1, No ecg changes.   Dyspnea on exertion    a. 12/2015 Echo: EF 60-65%, no rwma.   Gastritis    Hepatitis B    OM (otitis media), recurrent    as a child; ruptured TM   Pneumonia 2007   hospitalized for possible pneumonia in [place] for 2 weeks   Recurrent sinusitis     Past Surgical History:  Procedure Laterality Date   fallopian tube removal Left March 2014   INNER EAR SURGERY     LAPAROSCOPIC OVARIAN CYSTECTOMY  2008   in [place]   REFRACTIVE SURGERY Bilateral 1998   TYMPANOPLASTY WITH GRAFT Left 2017   [place], Dr. Ned Grace    Family History  Problem Relation Age of Onset   Hypertension Mother    Heart disease Mother    CAD Mother    Heart attack Mother    Arthritis Mother    CAD Father    Heart disease Father    Glaucoma Father    Cancer Father        kidney   Hypertension Brother    Breast cancer Paternal Aunt        late 60's- early 10's   Cancer Maternal Grandfather        unknown   Stroke Paternal Grandmother    Cancer Paternal Grandfather        lung?   COPD Neg Hx    Diabetes Neg Hx     Social History   Socioeconomic History   Marital status: Married    Spouse name: Not on file   Number of children: Not on file   Years of education: Not on file   Highest education level: Not on file  Occupational History   Not on file  Tobacco Use   Smoking status: Never   Smokeless tobacco: Never  Vaping Use   Vaping Use: Never  used  Substance and Sexual Activity   Alcohol use: Not Currently   Drug use: No   Sexual activity: Not Currently    Birth control/protection: Implant  Other Topics Concern   Not on file  Social History Narrative   Not on file   Social Determinants of Health   Financial Resource Strain: Not on file  Food Insecurity: Not on file  Transportation Needs: Not on file  Physical Activity: Not on file  Stress: Not on file  Social Connections: Not on file  Intimate Partner Violence: Not on file     Current Outpatient Medications:    azithromycin (ZITHROMAX) 250 MG tablet, Take 2 tablets on day 1, then 1 tablet daily on days 2 through 5, Disp: 6 tablet, Rfl: 0   etonogestrel (NEXPLANON) 68 MG IMPL implant, 1 each by Subdermal route once., Disp: , Rfl:    loratadine (CLARITIN) 10 MG tablet, Take 10 mg by mouth daily., Disp: , Rfl:  hydrochlorothiazide (HYDRODIURIL) 12.5 MG tablet, Take 1 tablet (12.5 mg total) by mouth daily., Disp: 90 tablet, Rfl: 3   Allergies  Allergen Reactions   Procaine Anaphylaxis and Shortness Of Breath    ROS Review of Systems  Constitutional: Negative.   HENT: Negative.    Eyes: Negative.   Respiratory: Negative.    Cardiovascular: Negative.   Gastrointestinal: Negative.   Endocrine: Negative.   Genitourinary: Negative.   Musculoskeletal: Negative.   Skin: Negative.   Allergic/Immunologic: Negative.   Neurological: Negative.   Hematological: Negative.   Psychiatric/Behavioral: Negative.    All other systems reviewed and are negative.    Objective:    Physical Exam Vitals reviewed.  Constitutional:      Appearance: Normal appearance.  HENT:     Mouth/Throat:     Mouth: Mucous membranes are moist.  Eyes:     Pupils: Pupils are equal, round, and reactive to light.  Neck:     Vascular: No carotid bruit.  Cardiovascular:     Rate and Rhythm: Normal rate and regular rhythm.     Pulses: Normal pulses.     Heart sounds: Normal heart  sounds.  Pulmonary:     Effort: Pulmonary effort is normal.     Breath sounds: Normal breath sounds.  Abdominal:     General: Bowel sounds are normal.     Palpations: Abdomen is soft. There is no hepatomegaly, splenomegaly or mass.     Tenderness: There is no abdominal tenderness.     Hernia: No hernia is present.  Musculoskeletal:        General: No tenderness.     Cervical back: Neck supple.     Right lower leg: No edema.     Left lower leg: No edema.  Skin:    Findings: No rash.  Neurological:     Mental Status: She is alert and oriented to person, place, and time.     Motor: No weakness.  Psychiatric:        Mood and Affect: Mood and affect normal.        Behavior: Behavior normal.    BP 136/77   Pulse 71   Ht 5\' 1"  (1.549 m)   Wt 199 lb 3.2 oz (90.4 kg)   BMI 37.64 kg/m  Wt Readings from Last 3 Encounters:  11/18/21 199 lb 3.2 oz (90.4 kg)  04/21/21 184 lb 8 oz (83.7 kg)  01/21/21 187 lb 3.2 oz (84.9 kg)     Health Maintenance Due  Topic Date Due   Hepatitis C Screening  Never done   Pneumococcal Vaccine 12-65 Years old (2 - PCV) 09/17/2018   COVID-19 Vaccine (3 - Booster for Pfizer series) 06/02/2020   INFLUENZA VACCINE  07/11/2021    There are no preventive care reminders to display for this patient.  Lab Results  Component Value Date   TSH 3.08 01/18/2021   Lab Results  Component Value Date   WBC 6.7 01/18/2021   HGB 14.8 01/18/2021   HCT 43.3 01/18/2021   MCV 90.8 01/18/2021   PLT 233 01/18/2021   Lab Results  Component Value Date   NA 138 01/18/2021   K 4.3 01/18/2021   CO2 25 01/18/2021   GLUCOSE 84 01/18/2021   BUN 12 01/18/2021   CREATININE 0.72 01/18/2021   BILITOT 0.8 01/18/2021   ALKPHOS 125 10/24/2018   AST 21 01/18/2021   ALT 19 01/18/2021   PROT 6.7 01/18/2021   ALBUMIN 3.0 (L) 10/24/2018  CALCIUM 9.3 01/18/2021   ANIONGAP 8 10/24/2018   Lab Results  Component Value Date   CHOL 172 01/18/2021   Lab Results  Component  Value Date   HDL 67 01/18/2021   Lab Results  Component Value Date   LDLCALC 88 01/18/2021   Lab Results  Component Value Date   TRIG 83 01/18/2021   Lab Results  Component Value Date   CHOLHDL 2.6 01/18/2021   No results found for: HGBA1C    Assessment & Plan:   Problem List Items Addressed This Visit       Cardiovascular and Mediastinum   Primary hypertension - Primary    Patient was advised to lose weight and walk on a daily basis.      Relevant Medications   hydrochlorothiazide (HYDRODIURIL) 12.5 MG tablet     Respiratory   Asthma (Chronic)    Under control        Digestive   HBV (hepatitis B virus) infection (Chronic)    Stable referral to GI      Relevant Medications   azithromycin (ZITHROMAX) 250 MG tablet   Gastritis    Continue Prilosec 20 mg p.o. daily        Nervous and Auditory   Carpal tunnel syndrome, left upper limb    Refer to Ortho      Carpal tunnel syndrome, right upper limb    Refer to Ortho        Other   Thoracic back pain    Chronic problem refer to Ortho      Vaccine for streptococcus pneumoniae and influenza    she was advised to take the vaccine      BMI 35.0-35.9,adult    - I encouraged the patient to lose weight.  - I educated them on making healthy dietary choices including eating more fruits and vegetables and less fried foods. - I encouraged the patient to exercise more, and educated on the benefits of exercise including weight loss, diabetes prevention, and hypertension prevention.   Dietary counseling with a registered dietician  Referral to a weight management support group (e.g. Weight Watchers, Overeaters Anonymous)  If your BMI is greater than 29 or you have gained more than 15 pounds you should work on weight loss.  Attend a healthy cooking class        Meds ordered this encounter  Medications   hydrochlorothiazide (HYDRODIURIL) 12.5 MG tablet    Sig: Take 1 tablet (12.5 mg total) by mouth  daily.    Dispense:  90 tablet    Refill:  3   azithromycin (ZITHROMAX) 250 MG tablet    Sig: Take 2 tablets on day 1, then 1 tablet daily on days 2 through 5    Dispense:  6 tablet    Refill:  0    Follow-up: No follow-ups on file.    Cletis Athens, MD

## 2021-11-20 NOTE — Assessment & Plan Note (Signed)

## 2021-11-20 NOTE — Assessment & Plan Note (Signed)
Under control 

## 2021-11-21 ENCOUNTER — Other Ambulatory Visit: Payer: Self-pay

## 2021-11-21 ENCOUNTER — Encounter: Payer: Self-pay | Admitting: Obstetrics & Gynecology

## 2021-11-21 ENCOUNTER — Ambulatory Visit: Payer: BC Managed Care – PPO | Admitting: Obstetrics & Gynecology

## 2021-11-21 VITALS — BP 110/60 | Ht 61.0 in | Wt 199.0 lb

## 2021-11-21 DIAGNOSIS — Z3046 Encounter for surveillance of implantable subdermal contraceptive: Secondary | ICD-10-CM | POA: Diagnosis not present

## 2021-11-21 NOTE — Progress Notes (Signed)
  Nexplanon removal  Patient given informed consent, signed copy in the chart, time out was performed.   Procedure note - The Nexplanon was noted in the patient's arm and the end was identified. The skin was cleansed with a Betadine solution. A small injection (3 cc) of subcutaneous lidocaine with epinephrine was given over the end of the implant. An incision was made at the end of the implant. The rod was noted in the incision and grasped with a hemostat. It was noted to be intact.  Hemostasis was noted.  Nexplanon Insertion Nexplanon removed form packaging,  Device confirmed in needle, then inserted full length of needle and withdrawn per handbook instructions.  Pt insertion site covered with steri-strip and a bandage.   Minimal blood loss.  Pt tolerated the procedure well.   Barnett Applebaum, MD, [name], [facility] 11/21/2021  10:23 AM

## 2021-11-24 NOTE — Telephone Encounter (Signed)
Nexplanon rcvd/charged 11/21/2021

## 2022-04-16 NOTE — Progress Notes (Signed)
? ?PCP:  Cletis Athens, MD ? ? ?Chief Complaint  ?Patient presents with  ? Gynecologic Exam  ?  No concerns  ? ? ? ?HPI: ?     Laura Espinoza is a 46 y.o. [phone] whose LMP was No LMP recorded. Patient has had an implant., presents today for her annual examination.  Her menses are absent with nexplanon. No BTB/dysmen.   ? ?Sex activity: single partner, contraception - Nexplanon replaced 11/21/21 ?Last Pap: 12/23/19 Results were: no abnormalities /neg HPV DNA; no hx of abn paps.  ? ?Last mammogram: 04/01/21 Results were: normal--routine follow-up in 12 months; has cluster RT breast cysts ?There is a FH of breast cancer in her pat aunt, genetic testing not indicated. There is no FH of ovarian cancer. The patient does do self-breast exams. ? ?Tobacco use: The patient denies current or previous tobacco use. ?Alcohol use: social drinker ?No drug use.  ?Exercise: moderately active ? ?She does get adequate calcium but not Vitamin D in her diet. ? ?Hx of HTN, was managed by PCP but he left. Pt on HCTZ 12.5 mg. Taking QOD since BP is usually normal and was having back pain. Sx improved with QOD dosing. Pt due to CMP. Trying to lose wt with [employer], slow but is losing wt. ?Had normal lipids 2022.  ? ?Patient Active Problem List  ? Diagnosis Date Noted  ? Solitary bone cyst of lower leg, right 04/21/2021  ? Primary hypertension 01/07/2021  ? Urinary incontinence 10/17/2018  ? Carpal tunnel syndrome, left upper limb 09/17/2018  ? Carpal tunnel syndrome, right upper limb 09/17/2018  ? BMI 35.0-35.9,adult 03/19/2018  ? Breast mass, right 11/11/2017  ? Vaccine for streptococcus pneumoniae and influenza 09/17/2017  ? Encounter for annual physical exam 09/17/2017  ? Need for hepatitis A vaccination 09/15/2016  ? Asthma 09/15/2016  ? Perforated tympanic membrane 04/09/2016  ? Right knee pain 11/17/2015  ? Thoracic back pain 11/17/2015  ? Preventative health care 11/17/2015  ? Allergic rhinitis   ? Anxiety   ? Gastritis   ? HBV  (hepatitis B virus) infection 06/04/2014  ? ? ?Past Surgical History:  ?Procedure Laterality Date  ? fallopian tube removal Left March 2014  ? INNER EAR SURGERY    ? LAPAROSCOPIC OVARIAN CYSTECTOMY  2008  ? in [place]  ? REFRACTIVE SURGERY Bilateral 1998  ? TYMPANOPLASTY WITH GRAFT Left 2017  ? [place], Dr. Ned Grace  ? ? ?Family History  ?Problem Relation Age of Onset  ? Hypertension Mother   ? Heart disease Mother   ? CAD Mother   ? Heart attack Mother   ? Arthritis Mother   ? CAD Father   ? Heart disease Father   ? Glaucoma Father   ? Cancer Father   ?     kidney  ? Hypertension Brother   ? Breast cancer Paternal Aunt   ?     late 60's- early 18's  ? Cancer Maternal Grandfather   ?     unknown  ? Stroke Paternal Grandmother   ? Cancer Paternal Grandfather   ?     lung?  ? COPD Neg Hx   ? Diabetes Neg Hx   ? ? ?Social History  ? ?Socioeconomic History  ? Marital status: Married  ?  Spouse name: Not on file  ? Number of children: Not on file  ? Years of education: Not on file  ? Highest education level: Not on file  ?Occupational History  ?  Not on file  ?Tobacco Use  ? Smoking status: Never  ? Smokeless tobacco: Never  ?Vaping Use  ? Vaping Use: Never used  ?Substance and Sexual Activity  ? Alcohol use: Not Currently  ? Drug use: No  ? Sexual activity: Yes  ?  Birth control/protection: Implant  ?Other Topics Concern  ? Not on file  ?Social History Narrative  ? Not on file  ? ?Social Determinants of Health  ? ?Financial Resource Strain: Not on file  ?Food Insecurity: Not on file  ?Transportation Needs: Not on file  ?Physical Activity: Not on file  ?Stress: Not on file  ?Social Connections: Not on file  ?Intimate Partner Violence: Not on file  ? ? ? ?Current Outpatient Medications:  ?  etonogestrel (NEXPLANON) 68 MG IMPL implant, 1 each by Subdermal route once., Disp: , Rfl:  ?  hydrochlorothiazide (HYDRODIURIL) 12.5 MG tablet, Take 1 tablet (12.5 mg total) by mouth daily., Disp: 90 tablet, Rfl: 3 ?  loratadine  (CLARITIN) 10 MG tablet, Take 10 mg by mouth daily., Disp: , Rfl:  ?  Multiple Vitamin (MULTI-VITAMIN) tablet, Take 1 tablet by mouth daily., Disp: , Rfl:  ? ? ? ? ?ROS: ? ?Review of Systems  ?Constitutional:  Negative for fatigue, fever and unexpected weight change.  ?Respiratory:  Negative for cough, shortness of breath and wheezing.   ?Cardiovascular:  Negative for chest pain, palpitations and leg swelling.  ?Gastrointestinal:  Negative for blood in stool, constipation, diarrhea, nausea and vomiting.  ?Endocrine: Negative for cold intolerance, heat intolerance and polyuria.  ?Genitourinary:  Negative for dyspareunia, dysuria, flank pain, frequency, genital sores, hematuria, menstrual problem, pelvic pain, urgency, vaginal bleeding, vaginal discharge and vaginal pain.  ?Musculoskeletal:  Negative for back pain, joint swelling and myalgias.  ?Skin:  Negative for rash.  ?Neurological:  Negative for dizziness, syncope, light-headedness, numbness and headaches.  ?Hematological:  Negative for adenopathy.  ?Psychiatric/Behavioral:  Negative for agitation, confusion, sleep disturbance and suicidal ideas. The patient is not nervous/anxious.   ?BREAST: No symptoms ? ? ?Objective: ?BP 112/80   Ht '5\' 1"'$  (1.549 m)   Wt 192 lb (87.1 kg)   Breastfeeding No   BMI 36.28 kg/m?  ? ? ?Physical Exam ?Constitutional:   ?   Appearance: She is well-developed.  ?Genitourinary:  ?   Vulva normal.  ?   Right Labia: No rash, tenderness or lesions. ?   Left Labia: No tenderness, lesions or rash. ?   No vaginal discharge, erythema or tenderness.  ? ?   Right Adnexa: not tender and no mass present. ?   Left Adnexa: not tender and no mass present. ?   No cervical friability or polyp.  ?   Uterus is not enlarged or tender.  ?Breasts: ?   Right: No mass, nipple discharge, skin change or tenderness.  ?   Left: No mass, nipple discharge, skin change or tenderness.  ?Neck:  ?   Thyroid: No thyromegaly.  ?Cardiovascular:  ?   Rate and Rhythm:  Normal rate and regular rhythm.  ?   Heart sounds: Normal heart sounds. No murmur heard. ?Pulmonary:  ?   Effort: Pulmonary effort is normal.  ?   Breath sounds: Normal breath sounds.  ?Abdominal:  ?   Palpations: Abdomen is soft.  ?   Tenderness: There is no abdominal tenderness. There is no guarding or rebound.  ?Musculoskeletal:     ?   General: Normal range of motion.  ?   Cervical back: Normal  range of motion.  ?Lymphadenopathy:  ?   Cervical: No cervical adenopathy.  ?Neurological:  ?   General: No focal deficit present.  ?   Mental Status: She is alert and oriented to person, place, and time.  ?   Cranial Nerves: No cranial nerve deficit.  ?Skin: ?   General: Skin is warm and dry.  ?Psychiatric:     ?   Mood and Affect: Mood normal.     ?   Behavior: Behavior normal.     ?   Thought Content: Thought content normal.     ?   Judgment: Judgment normal.  ?Vitals reviewed.  ? ? ?Assessment/Plan: ?Encounter for annual routine gynecological examination ? ?Encounter for surveillance of implantable subdermal contraceptive; due for removal 12/25 ? ?Encounter for screening mammogram for malignant neoplasm of breast - Plan: MM 3D SCREEN BREAST BILATERAL; pt to schedule mammo ? ?Essential hypertension - Plan: Comprehensive metabolic panel; chck CMP. Pt to stop HCTZ since taking QOD and check BP BID for 2 wks. Pt to then f/u with me with results. If >130/90, will start ACEI for pt since thinks back pain from HCTZ and worries about kidneys with Rx.  ? ?          ?GYN counsel breast self exam, mammography screening, adequate intake of calcium and vitamin D, diet and exercise ? ? ?  F/U ? Return in about 1 year (around 04/19/2023). ? ?Jadakiss Barish B. Sunni Richardson, PA-C ?04/18/2022 ?10:47 AM ?

## 2022-04-18 ENCOUNTER — Encounter: Payer: Self-pay | Admitting: Obstetrics and Gynecology

## 2022-04-18 ENCOUNTER — Ambulatory Visit (INDEPENDENT_AMBULATORY_CARE_PROVIDER_SITE_OTHER): Payer: BC Managed Care – PPO | Admitting: Obstetrics and Gynecology

## 2022-04-18 VITALS — BP 112/80 | Ht 61.0 in | Wt 192.0 lb

## 2022-04-18 DIAGNOSIS — Z1231 Encounter for screening mammogram for malignant neoplasm of breast: Secondary | ICD-10-CM | POA: Diagnosis not present

## 2022-04-18 DIAGNOSIS — Z3046 Encounter for surveillance of implantable subdermal contraceptive: Secondary | ICD-10-CM

## 2022-04-18 DIAGNOSIS — Z01419 Encounter for gynecological examination (general) (routine) without abnormal findings: Secondary | ICD-10-CM | POA: Diagnosis not present

## 2022-04-18 DIAGNOSIS — I10 Essential (primary) hypertension: Secondary | ICD-10-CM | POA: Diagnosis not present

## 2022-04-18 NOTE — Patient Instructions (Addendum)
I value your feedback and you entrusting us with your care. If you get a [facility] patient survey, I would appreciate you taking the time to let us know about your experience today. Thank you!  [facility]: [phone]      

## 2022-04-19 LAB — COMPREHENSIVE METABOLIC PANEL
ALT: 17 IU/L (ref 0–32)
AST: 19 IU/L (ref 0–40)
Albumin/Globulin Ratio: 1.8 (ref 1.2–2.2)
Albumin: 4.5 g/dL (ref 3.8–4.8)
Alkaline Phosphatase: 65 IU/L (ref 44–121)
BUN/Creatinine Ratio: 14 (ref 9–23)
BUN: 11 mg/dL (ref 6–24)
Bilirubin Total: 0.8 mg/dL (ref 0.0–1.2)
CO2: 21 mmol/L (ref 20–29)
Calcium: 9.6 mg/dL (ref 8.7–10.2)
Chloride: 105 mmol/L (ref 96–106)
Creatinine, Ser: 0.8 mg/dL (ref 0.57–1.00)
Globulin, Total: 2.5 g/dL (ref 1.5–4.5)
Glucose: 84 mg/dL (ref 70–99)
Potassium: 4.4 mmol/L (ref 3.5–5.2)
Sodium: 141 mmol/L (ref 134–144)
Total Protein: 7 g/dL (ref 6.0–8.5)
eGFR: 93 mL/min/{1.73_m2} (ref >59)

## 2022-05-23 ENCOUNTER — Ambulatory Visit
Admission: RE | Admit: 2022-05-23 | Discharge: 2022-05-23 | Disposition: A | Discharge status: Home / Self Care | Payer: BC Managed Care – PPO | Source: Ambulatory Visit | Attending: Obstetrics and Gynecology | Admitting: Obstetrics and Gynecology

## 2022-05-23 DIAGNOSIS — Z1231 Encounter for screening mammogram for malignant neoplasm of breast: Secondary | ICD-10-CM | POA: Diagnosis not present

## 2022-06-16 ENCOUNTER — Other Ambulatory Visit: Payer: Self-pay | Admitting: Gastroenterology

## 2022-06-16 DIAGNOSIS — B181 Chronic viral hepatitis B without delta-agent: Secondary | ICD-10-CM | POA: Diagnosis not present

## 2022-06-18 DIAGNOSIS — J029 Acute pharyngitis, unspecified: Secondary | ICD-10-CM | POA: Diagnosis not present

## 2022-06-18 DIAGNOSIS — R059 Cough, unspecified: Secondary | ICD-10-CM | POA: Diagnosis not present

## 2022-06-18 DIAGNOSIS — R0982 Postnasal drip: Secondary | ICD-10-CM | POA: Diagnosis not present

## 2022-06-18 DIAGNOSIS — J019 Acute sinusitis, unspecified: Secondary | ICD-10-CM | POA: Diagnosis not present

## 2022-07-04 ENCOUNTER — Ambulatory Visit
Admission: RE | Admit: 2022-07-04 | Discharge: 2022-07-04 | Disposition: A | Discharge status: Home / Self Care | Payer: BC Managed Care – PPO | Source: Ambulatory Visit | Attending: Gastroenterology | Admitting: Gastroenterology

## 2022-07-04 DIAGNOSIS — B181 Chronic viral hepatitis B without delta-agent: Secondary | ICD-10-CM | POA: Diagnosis not present

## 2022-07-04 DIAGNOSIS — B1910 Unspecified viral hepatitis B without hepatic coma: Secondary | ICD-10-CM | POA: Diagnosis not present

## 2022-07-04 DIAGNOSIS — K7689 Other specified diseases of liver: Secondary | ICD-10-CM | POA: Diagnosis not present

## 2022-07-12 ENCOUNTER — Ambulatory Visit: Payer: BC Managed Care – PPO | Admitting: Internal Medicine

## 2022-07-12 ENCOUNTER — Encounter: Payer: Self-pay | Admitting: Internal Medicine

## 2022-07-12 VITALS — BP 140/78 | HR 84 | Ht 61.0 in | Wt 189.7 lb

## 2022-07-12 DIAGNOSIS — S93505D Unspecified sprain of left lesser toe(s), subsequent encounter: Secondary | ICD-10-CM

## 2022-07-12 DIAGNOSIS — B181 Chronic viral hepatitis B without delta-agent: Secondary | ICD-10-CM | POA: Insufficient documentation

## 2022-07-12 DIAGNOSIS — J4520 Mild intermittent asthma, uncomplicated: Secondary | ICD-10-CM | POA: Diagnosis not present

## 2022-07-12 DIAGNOSIS — S93505A Unspecified sprain of left lesser toe(s), initial encounter: Secondary | ICD-10-CM | POA: Insufficient documentation

## 2022-07-12 DIAGNOSIS — I10 Essential (primary) hypertension: Secondary | ICD-10-CM

## 2022-07-12 DIAGNOSIS — K2930 Chronic superficial gastritis without bleeding: Secondary | ICD-10-CM

## 2022-07-12 DIAGNOSIS — Z6835 Body mass index (BMI) 35.0-35.9, adult: Secondary | ICD-10-CM | POA: Diagnosis not present

## 2022-07-12 DIAGNOSIS — S93602A Unspecified sprain of left foot, initial encounter: Secondary | ICD-10-CM | POA: Diagnosis not present

## 2022-07-12 DIAGNOSIS — M79672 Pain in left foot: Secondary | ICD-10-CM | POA: Diagnosis not present

## 2022-07-12 MED ORDER — LOSARTAN POTASSIUM 25 MG PO TABS: 25.0000 mg | ORAL_TABLET | Freq: Every day | ORAL | 3 refills | Status: DC

## 2022-07-12 NOTE — Assessment & Plan Note (Signed)
We will refer the patient to the orthopedic surgeon wrap an Ace bandage on the left foot

## 2022-07-12 NOTE — Assessment & Plan Note (Signed)
Blood pressure is stable but patient does not want to use the diuretic.  So I started the patient on Cozaar 25 mg p.o. daily I will see him back in 4 months

## 2022-07-12 NOTE — Progress Notes (Signed)
Established Patient Office Visit  Subjective:  Patient ID: Laura Espinoza, female    DOB: February 08, 1976  Age: 46 y.o. MRN: [id number]  CC:  Chief Complaint  Patient presents with   Foot Pain    Patient having left foot pain due to [place].     Foot Pain    MAREESA Espinoza presents for sprain lt foot  Past Medical History:  Diagnosis Date   Allergic rhinitis    Anxiety    Breast mass, right 11/11/2017   Nov 09, 2017, add'l views ordered   Chest pain    a. 12/2015 ETT: Ex time: 8:00, Max HR 169 (92%), BP 201/71, METS 10.1, No ecg changes.   Dyspnea on exertion    a. 12/2015 Echo: EF 60-65%, no rwma.   Gastritis    Hepatitis B    Hypertension    OM (otitis media), recurrent    as a child; ruptured TM   Pneumonia 2007   hospitalized for possible pneumonia in [place] for 2 weeks   Recurrent sinusitis     Past Surgical History:  Procedure Laterality Date   fallopian tube removal Left March 2014   INNER EAR SURGERY     LAPAROSCOPIC OVARIAN CYSTECTOMY  2008   in [place]   REFRACTIVE SURGERY Bilateral 1998   TYMPANOPLASTY WITH GRAFT Left 2017   [address], Dr. Ned Grace    Family History  Problem Relation Age of Onset   Hypertension Mother    Heart disease Mother    CAD Mother    Heart attack Mother    Arthritis Mother    CAD Father    Heart disease Father    Glaucoma Father    Cancer Father        kidney   Hypertension Brother    Breast cancer Paternal Aunt        late 37's- early 71's   Cancer Maternal Grandfather        unknown   Stroke Paternal Grandmother    Cancer Paternal Grandfather        lung?   COPD Neg Hx    Diabetes Neg Hx     Social History   Socioeconomic History   Marital status: Married    Spouse name: Not on file   Number of children: Not on file   Years of education: Not on file   Highest education level: Not on file  Occupational History   Not on file  Tobacco Use   Smoking status: Never   Smokeless  tobacco: Never  Vaping Use   Vaping Use: Never used  Substance and Sexual Activity   Alcohol use: Not Currently   Drug use: No   Sexual activity: Yes    Birth control/protection: Implant  Other Topics Concern   Not on file  Social History Narrative   Not on file   Social Determinants of Health   Financial Resource Strain: Not on file  Food Insecurity: Not on file  Transportation Needs: Not on file  Physical Activity: Not on file  Stress: Not on file  Social Connections: Not on file  Intimate Partner Violence: Not on file     Current Outpatient Medications:    etonogestrel (NEXPLANON) 68 MG IMPL implant, 1 each by Subdermal route once., Disp: , Rfl:    loratadine (CLARITIN) 10 MG tablet, Take 10 mg by mouth daily., Disp: , Rfl:    Multiple Vitamin (MULTI-VITAMIN) tablet, Take 1 tablet  by mouth daily., Disp: , Rfl:    Allergies  Allergen Reactions   Procaine Anaphylaxis and Shortness Of Breath    ROS Review of Systems  Constitutional: Negative.   HENT: Negative.    Eyes: Negative.   Respiratory: Negative.    Cardiovascular: Negative.   Gastrointestinal: Negative.   Endocrine: Negative.   Genitourinary: Negative.   Musculoskeletal: Negative.   Skin: Negative.   Allergic/Immunologic: Negative.   Neurological: Negative.   Hematological: Negative.   Psychiatric/Behavioral: Negative.    All other systems reviewed and are negative.     Objective:    Physical Exam Vitals reviewed.  Constitutional:      Appearance: Normal appearance.  HENT:     Mouth/Throat:     Mouth: Mucous membranes are moist.  Eyes:     Pupils: Pupils are equal, round, and reactive to light.  Neck:     Vascular: No carotid bruit.  Cardiovascular:     Rate and Rhythm: Normal rate and regular rhythm.     Pulses: Normal pulses.     Heart sounds: Normal heart sounds.  Pulmonary:     Effort: Pulmonary effort is normal.     Breath sounds: Normal breath sounds.  Abdominal:     General:  Bowel sounds are normal.     Palpations: Abdomen is soft. There is no hepatomegaly, splenomegaly or mass.     Tenderness: There is no abdominal tenderness.     Hernia: No hernia is present.  Musculoskeletal:        General: No tenderness.     Cervical back: Neck supple.     Right lower leg: No edema.     Left lower leg: No edema.     Comments: Pain in the left little toe  Skin:    Findings: No rash.  Neurological:     Mental Status: She is alert and oriented to person, place, and time.     Motor: No weakness.  Psychiatric:        Mood and Affect: Mood and affect normal.        Behavior: Behavior normal.     BP (!) 140/78   Pulse 84   Ht '5\' 1"'  (1.549 m)   Wt 189 lb 11.2 oz (86 kg)   BMI 35.84 kg/m  Wt Readings from Last 3 Encounters:  07/12/22 189 lb 11.2 oz (86 kg)  04/18/22 192 lb (87.1 kg)  11/21/21 199 lb (90.3 kg)     Health Maintenance Due  Topic Date Due   Hepatitis C Screening  Never done   COVID-19 Vaccine (3 - Pfizer series) 06/02/2020   INFLUENZA VACCINE  07/11/2022    There are no preventive care reminders to display for this patient.  Lab Results  Component Value Date   TSH 3.08 01/18/2021   Lab Results  Component Value Date   WBC 6.7 01/18/2021   HGB 14.8 01/18/2021   HCT 43.3 01/18/2021   MCV 90.8 01/18/2021   PLT 233 01/18/2021   Lab Results  Component Value Date   NA 141 04/18/2022   K 4.4 04/18/2022   CO2 21 04/18/2022   GLUCOSE 84 04/18/2022   BUN 11 04/18/2022   CREATININE 0.80 04/18/2022   BILITOT 0.8 04/18/2022   ALKPHOS 65 04/18/2022   AST 19 04/18/2022   ALT 17 04/18/2022   PROT 7.0 04/18/2022   ALBUMIN 4.5 04/18/2022   CALCIUM 9.6 04/18/2022   ANIONGAP 8 10/24/2018   EGFR 93 04/18/2022   Lab  Results  Component Value Date   CHOL 172 01/18/2021   Lab Results  Component Value Date   HDL 67 01/18/2021   Lab Results  Component Value Date   LDLCALC 88 01/18/2021   Lab Results  Component Value Date   TRIG 83  01/18/2021   Lab Results  Component Value Date   CHOLHDL 2.6 01/18/2021   No results found for: "HGBA1C"    Assessment & Plan:  Primary hypertension  Mild intermittent asthma without complication  Hepatitis B carrier (HCC)  BMI 35.0-35.9,adult  Superficial gastritis without hemorrhage, unspecified chronicity  Sprain of fifth toe of left foot, subsequent encounter     No orders of the defined types were placed in this encounter.   Follow-up: No follow-ups on file.    Cletis Athens, MD

## 2022-07-12 NOTE — Assessment & Plan Note (Signed)

## 2022-07-12 NOTE — Assessment & Plan Note (Signed)
Patient was advised to use Prilosec 20 mg p.o. daily for the heartburn

## 2022-07-12 NOTE — Assessment & Plan Note (Signed)
Patient is being followed up by GI

## 2022-07-12 NOTE — Assessment & Plan Note (Signed)
Under control 

## 2022-07-18 DIAGNOSIS — M79672 Pain in left foot: Secondary | ICD-10-CM | POA: Diagnosis not present

## 2022-07-19 ENCOUNTER — Encounter: Payer: Self-pay | Admitting: Internal Medicine

## 2022-07-20 DIAGNOSIS — M79672 Pain in left foot: Secondary | ICD-10-CM | POA: Diagnosis not present

## 2022-09-20 DIAGNOSIS — B181 Chronic viral hepatitis B without delta-agent: Secondary | ICD-10-CM | POA: Diagnosis not present

## 2022-11-13 ENCOUNTER — Ambulatory Visit: Payer: BC Managed Care – PPO | Admitting: Internal Medicine

## 2022-11-13 ENCOUNTER — Encounter: Payer: Self-pay | Admitting: Internal Medicine

## 2022-11-13 VITALS — BP 148/93 | HR 81 | Temp 98.2°F | Resp 97 | Ht 61.0 in | Wt 199.0 lb

## 2022-11-13 DIAGNOSIS — I10 Essential (primary) hypertension: Secondary | ICD-10-CM

## 2022-11-13 DIAGNOSIS — J4520 Mild intermittent asthma, uncomplicated: Secondary | ICD-10-CM | POA: Diagnosis not present

## 2022-11-13 DIAGNOSIS — B181 Chronic viral hepatitis B without delta-agent: Secondary | ICD-10-CM

## 2022-11-13 DIAGNOSIS — M546 Pain in thoracic spine: Secondary | ICD-10-CM

## 2022-11-13 MED ORDER — AMLODIPINE BESYLATE 5 MG PO TABS: 5.0000 mg | ORAL_TABLET | Freq: Every day | ORAL | 1 refills | Status: DC

## 2022-11-13 MED ORDER — FLUTICASONE-SALMETEROL 100-50 MCG/ACT IN AEPB: 2.0000 | INHALATION_SPRAY | Freq: Two times a day (BID) | RESPIRATORY_TRACT | 3 refills | Status: DC

## 2022-11-13 MED ORDER — FLUTICASONE-SALMETEROL 100-50 MCG/ACT IN AEPB: 1.0000 | INHALATION_SPRAY | Freq: Two times a day (BID) | RESPIRATORY_TRACT | 3 refills | Status: DC

## 2022-11-13 NOTE — Progress Notes (Signed)
Established Patient Office Visit  Subjective:  Patient ID: Laura Espinoza, female    DOB: 26-Jan-1976  Age: 46 y.o. MRN: [id number]  CC:  Chief Complaint  Patient presents with   Hypertension    4 month fu. She was unable to take the losartan, made her feel bad and pulse too low. She went back to taking her HCTZ as needed. Has had a cough x2 month. She took medications and it cleared the color of mucous but not the mucous itself.     HPI  Laura Espinoza presents for check up Losartan    Past  Patient is taking pills doses at least stable.  The fluid.  Past Medical History:  Diagnosis Date   Allergic rhinitis    Anxiety    Breast mass, right 11/11/2017   Nov 09, 2017, add'l views ordered   Chest pain    a. 12/2015 ETT: Ex time: 8:00, Max HR 169 (92%), BP 201/71, METS 10.1, No ecg changes.   Dyspnea on exertion    a. 12/2015 Echo: EF 60-65%, no rwma.   Gastritis    Hepatitis B    Hypertension    OM (otitis media), recurrent    as a child; ruptured TM   Pneumonia 2007   hospitalized for possible pneumonia in [place] for 2 weeks   Recurrent sinusitis     Past Surgical History:  Procedure Laterality Date   fallopian tube removal Left March 2014   INNER EAR SURGERY     LAPAROSCOPIC OVARIAN CYSTECTOMY  2008   in [place]   REFRACTIVE SURGERY Bilateral 1998   TYMPANOPLASTY WITH GRAFT Left 2017   [place], Dr. Ned Grace    Family History  Problem Relation Age of Onset   Hypertension Mother    Heart disease Mother    CAD Mother    Heart attack Mother    Arthritis Mother    CAD Father    Heart disease Father    Glaucoma Father    Cancer Father        kidney   Hypertension Brother    Breast cancer Paternal Aunt        late 83's- early 33's   Cancer Maternal Grandfather        unknown   Stroke Paternal Grandmother    Cancer Paternal Grandfather        lung?   COPD Neg Hx    Diabetes Neg Hx     Social History   Socioeconomic History   Marital status: Married     Spouse name: Not on file   Number of children: Not on file   Years of education: Not on file   Highest education level: Not on file  Occupational History   Not on file  Tobacco Use   Smoking status: Never   Smokeless tobacco: Never  Vaping Use   Vaping Use: Never used  Substance and Sexual Activity   Alcohol use: Not Currently   Drug use: No   Sexual activity: Yes    Birth control/protection: Implant  Other Topics Concern   Not on file  Social History Narrative   Not on file   Social Determinants of Health   Financial Resource Strain: Not on file  Food Insecurity: Not on file  Transportation Needs: Not on file  Physical Activity: Not on file  Stress: Not on file  Social Connections: Not on file  Intimate Partner Violence: Not on file     Current Outpatient Medications:  amLODipine (NORVASC) 5 MG tablet, Take 1 tablet (5 mg total) by mouth daily., Disp: 90 tablet, Rfl: 1   etonogestrel (NEXPLANON) 68 MG IMPL implant, 1 each by Subdermal route once., Disp: , Rfl:    fluticasone-salmeterol (ADVAIR) 100-50 MCG/ACT AEPB, Inhale 2 puffs into the lungs 2 (two) times daily., Disp: 1 each, Rfl: 3   hydrochlorothiazide (MICROZIDE) 12.5 MG capsule, Take 12.5 mg by mouth as needed., Disp: , Rfl:    loratadine (CLARITIN) 10 MG tablet, Take 10 mg by mouth daily., Disp: , Rfl:    Multiple Vitamin (MULTI-VITAMIN) tablet, Take 1 tablet by mouth daily., Disp: , Rfl:    Allergies  Allergen Reactions   Procaine Anaphylaxis and Shortness Of Breath   Losartan Other (See Comments)    Had to stop, low pulse felt bad.     ROS Review of Systems    Objective:    Physical Exam Vitals reviewed.  Constitutional:      Appearance: Normal appearance.  HENT:     Mouth/Throat:     Mouth: Mucous membranes are moist.     Pharynx: No oropharyngeal exudate.  Eyes:     Pupils: Pupils are equal, round, and reactive to light.  Neck:     Vascular: No carotid bruit.  Cardiovascular:      Rate and Rhythm: Normal rate and regular rhythm.     Pulses: Normal pulses.     Heart sounds: Normal heart sounds.  Pulmonary:     Effort: Pulmonary effort is normal.     Breath sounds: Wheezing present.  Abdominal:     General: Bowel sounds are normal.     Palpations: Abdomen is soft. There is no hepatomegaly, splenomegaly or mass.     Tenderness: There is no abdominal tenderness. There is no guarding.     Hernia: No hernia is present.  Musculoskeletal:        General: No swelling or tenderness.     Cervical back: Neck supple.     Right lower leg: No edema.     Left lower leg: No edema.  Skin:    Coloration: Skin is not jaundiced.     Findings: No rash.  Neurological:     Mental Status: She is alert and oriented to person, place, and time. Mental status is at baseline.     Motor: No weakness.  Psychiatric:        Mood and Affect: Mood and affect normal.        Behavior: Behavior normal.     BP (!) 148/93   Pulse 81   Temp 98.2 F (36.8 C) (Temporal)   Resp (!) 97   Ht _0  (1.549 m)   Wt 199 lb (90.3 kg)   BMI 37.60 kg/m  Wt Readings from Last 3 Encounters:  11/13/22 199 lb (90.3 kg)  07/12/22 189 lb 11.2 oz (86 kg)  04/18/22 192 lb (87.1 kg)     Health Maintenance Due  Topic Date Due   Hepatitis C Screening  Never done   COLONOSCOPY (Pts 45-14yr Insurance coverage will need to be confirmed)  Never done   PAP SMEAR-Modifier  12/22/2022    There are no preventive care reminders to display for this patient.  Lab Results  Component Value Date   TSH 3.08 01/18/2021   Lab Results  Component Value Date   WBC 6.7 01/18/2021   HGB 14.8 01/18/2021   HCT 43.3 01/18/2021   MCV 90.8 01/18/2021   PLT 233 01/18/2021  Lab Results  Component Value Date   NA 141 04/18/2022   K 4.4 04/18/2022   CO2 21 04/18/2022   GLUCOSE 84 04/18/2022   BUN 11 04/18/2022   CREATININE 0.80 04/18/2022   BILITOT 0.8 04/18/2022   ALKPHOS 65 04/18/2022   AST 19 04/18/2022    ALT 17 04/18/2022   PROT 7.0 04/18/2022   ALBUMIN 4.5 04/18/2022   CALCIUM 9.6 04/18/2022   ANIONGAP 8 10/24/2018   EGFR 93 04/18/2022   Lab Results  Component Value Date   CHOL 172 01/18/2021   Lab Results  Component Value Date   HDL 67 01/18/2021   Lab Results  Component Value Date   LDLCALC 88 01/18/2021   Lab Results  Component Value Date   TRIG 83 01/18/2021   Lab Results  Component Value Date   CHOLHDL 2.6 01/18/2021   No results found for: "HGBA1C"    Assessment & Plan:   Problem List Items Addressed This Visit       Cardiovascular and Mediastinum   Primary hypertension - Primary     Patient denies any chest pain or shortness of breath there is no history of palpitation or paroxysmal nocturnal dyspnea   patient was advised to follow low-salt low-cholesterol diet    ideally I want to keep systolic blood pressure below 130 mmHg, patient was asked to check blood pressure one times a week and give me a report on that.  Patient will be follow-up in 3 months  or earlier as needed, patient will call me back for any change in the cardiovascular symptoms Patient was advised to buy a book from local bookstore concerning blood pressure and read several chapters  every day.  This will be supplemented by some of the material we will give him from the office.  Patient should also utilize other resources like YouTube and Internet to learn more about the blood pressure and the diet.      Relevant Medications   hydrochlorothiazide (MICROZIDE) 12.5 MG capsule   amLODipine (NORVASC) 5 MG tablet     Respiratory   Mild intermittent asthma without complication    Patient was started on advair      Relevant Medications   fluticasone-salmeterol (ADVAIR) 100-50 MCG/ACT AEPB     Digestive   HBV (hepatitis B virus) infection (Chronic)    Stable at the present time        Other   Thoracic back pain    Stable       Meds ordered this encounter  Medications   amLODipine  (NORVASC) 5 MG tablet    Sig: Take 1 tablet (5 mg total) by mouth daily.    Dispense:  90 tablet    Refill:  1   DISCONTD: fluticasone-salmeterol (ADVAIR) 100-50 MCG/ACT AEPB    Sig: Inhale 1 puff into the lungs 2 (two) times daily.    Dispense:  1 each    Refill:  3   fluticasone-salmeterol (ADVAIR) 100-50 MCG/ACT AEPB    Sig: Inhale 2 puffs into the lungs 2 (two) times daily.    Dispense:  1 each    Refill:  3    Follow-up: No follow-ups on file.    Cletis Athens, MD

## 2022-11-13 NOTE — Assessment & Plan Note (Signed)
Stable

## 2022-11-13 NOTE — Assessment & Plan Note (Signed)
Patient was started on advair

## 2022-11-13 NOTE — Progress Notes (Signed)
Established Patient Office Visit  Subjective:  Patient ID: Laura Espinoza, female    DOB: 02/06/1976  Age: 46 y.o. MRN: [id number]  CC:  Chief Complaint  Patient presents with   Hypertension    4 month fu. She was unable to take the losartan, made her feel bad and pulse too low. She went back to taking her HCTZ as needed. Has had a cough x2 month. She took medications and it cleared the color of mucous but not the mucous itself.     Hypertension    [name] presents for bp check  Past Medical History:  Diagnosis Date   Allergic rhinitis    Anxiety    Breast mass, right 11/11/2017   Nov 09, 2017, add'l views ordered   Chest pain    a. 12/2015 ETT: Ex time: 8:00, Max HR 169 (92%), BP 201/71, METS 10.1, No ecg changes.   Dyspnea on exertion    a. 12/2015 Echo: EF 60-65%, no rwma.   Gastritis    Hepatitis B    Hypertension    OM (otitis media), recurrent    as a child; ruptured TM   Pneumonia 2007   hospitalized for possible pneumonia in [place] for 2 weeks   Recurrent sinusitis     Past Surgical History:  Procedure Laterality Date   fallopian tube removal Left March 2014   INNER EAR SURGERY     LAPAROSCOPIC OVARIAN CYSTECTOMY  2008   in [place]   REFRACTIVE SURGERY Bilateral 1998   TYMPANOPLASTY WITH GRAFT Left 2017   [place], Dr. Ned Grace    Family History  Problem Relation Age of Onset   Hypertension Mother    Heart disease Mother    CAD Mother    Heart attack Mother    Arthritis Mother    CAD Father    Heart disease Father    Glaucoma Father    Cancer Father        kidney   Hypertension Brother    Breast cancer Paternal Aunt        late 2's- early 21's   Cancer Maternal Grandfather        unknown   Stroke Paternal Grandmother    Cancer Paternal Grandfather        lung?   COPD Neg Hx    Diabetes Neg Hx     Social History   Socioeconomic History   Marital status: Married    Spouse name: Not on file   Number of children: Not on file    Years of education: Not on file   Highest education level: Not on file  Occupational History   Not on file  Tobacco Use   Smoking status: Never   Smokeless tobacco: Never  Vaping Use   Vaping Use: Never used  Substance and Sexual Activity   Alcohol use: Not Currently   Drug use: No   Sexual activity: Yes    Birth control/protection: Implant  Other Topics Concern   Not on file  Social History Narrative   Not on file   Social Determinants of Health   Financial Resource Strain: Not on file  Food Insecurity: Not on file  Transportation Needs: Not on file  Physical Activity: Not on file  Stress: Not on file  Social Connections: Not on file  Intimate Partner Violence: Not on file     Current Outpatient Medications:    etonogestrel (NEXPLANON) 77 MG IMPL implant, 1 each by Subdermal route once., Disp: ,  Rfl:    hydrochlorothiazide (MICROZIDE) 12.5 MG capsule, Take 12.5 mg by mouth as needed., Disp: , Rfl:    loratadine (CLARITIN) 10 MG tablet, Take 10 mg by mouth daily., Disp: , Rfl:    Multiple Vitamin (MULTI-VITAMIN) tablet, Take 1 tablet by mouth daily., Disp: , Rfl:    Allergies  Allergen Reactions   Procaine Anaphylaxis and Shortness Of Breath   Losartan Other (See Comments)    Had to stop, low pulse felt bad.     ROS Review of Systems  Constitutional: Negative.   HENT: Negative.    Eyes: Negative.   Respiratory: Negative.    Cardiovascular: Negative.   Gastrointestinal: Negative.   Endocrine: Negative.   Genitourinary: Negative.   Musculoskeletal: Negative.   Skin: Negative.   Allergic/Immunologic: Negative.   Neurological: Negative.   Hematological: Negative.   Psychiatric/Behavioral: Negative.    All other systems reviewed and are negative.     Objective:    Physical Exam Vitals reviewed.  Constitutional:      Appearance: Normal appearance.  HENT:     Mouth/Throat:     Mouth: Mucous membranes are moist.  Eyes:     Pupils: Pupils are equal,  round, and reactive to light.  Neck:     Vascular: No carotid bruit.  Cardiovascular:     Rate and Rhythm: Normal rate and regular rhythm.     Pulses: Normal pulses.     Heart sounds: Normal heart sounds.  Pulmonary:     Effort: Pulmonary effort is normal.     Breath sounds: Normal breath sounds.  Abdominal:     General: Bowel sounds are normal.     Palpations: Abdomen is soft. There is no hepatomegaly, splenomegaly or mass.     Tenderness: There is no abdominal tenderness.     Hernia: No hernia is present.  Musculoskeletal:        General: No tenderness.     Cervical back: Neck supple.     Right lower leg: No edema.     Left lower leg: No edema.  Skin:    Findings: No rash.  Neurological:     Mental Status: She is alert and oriented to person, place, and time.     Motor: No weakness.  Psychiatric:        Mood and Affect: Mood and affect normal.        Behavior: Behavior normal.     BP (!) 148/93   Pulse 81   Temp 98.2 F (36.8 C) (Temporal)   Resp (!) 97   Ht _0  (1.549 m)   Wt 199 lb (90.3 kg)   BMI 37.60 kg/m  Wt Readings from Last 3 Encounters:  11/13/22 199 lb (90.3 kg)  07/12/22 189 lb 11.2 oz (86 kg)  04/18/22 192 lb (87.1 kg)     Health Maintenance Due  Topic Date Due   Hepatitis C Screening  Never done   COLONOSCOPY (Pts 45-91yr Insurance coverage will need to be confirmed)  Never done   PAP SMEAR-Modifier  12/22/2022    There are no preventive care reminders to display for this patient.  Lab Results  Component Value Date   TSH 3.08 01/18/2021   Lab Results  Component Value Date   WBC 6.7 01/18/2021   HGB 14.8 01/18/2021   HCT 43.3 01/18/2021   MCV 90.8 01/18/2021   PLT 233 01/18/2021   Lab Results  Component Value Date   NA 141 04/18/2022   K 4.4  04/18/2022   CO2 21 04/18/2022   GLUCOSE 84 04/18/2022   BUN 11 04/18/2022   CREATININE 0.80 04/18/2022   BILITOT 0.8 04/18/2022   ALKPHOS 65 04/18/2022   AST 19 04/18/2022   ALT 17  04/18/2022   PROT 7.0 04/18/2022   ALBUMIN 4.5 04/18/2022   CALCIUM 9.6 04/18/2022   ANIONGAP 8 10/24/2018   EGFR 93 04/18/2022   Lab Results  Component Value Date   CHOL 172 01/18/2021   Lab Results  Component Value Date   HDL 67 01/18/2021   Lab Results  Component Value Date   LDLCALC 88 01/18/2021   Lab Results  Component Value Date   TRIG 83 01/18/2021   Lab Results  Component Value Date   CHOLHDL 2.6 01/18/2021   No results found for: "HGBA1C"    Assessment & Plan:   Problem List Items Addressed This Visit   None   No orders of the defined types were placed in this encounter.   Follow-up: No follow-ups on file.    Cletis Athens, MD

## 2022-11-13 NOTE — Assessment & Plan Note (Signed)

## 2022-11-13 NOTE — Assessment & Plan Note (Signed)
Stable at the present time. 

## 2022-11-27 ENCOUNTER — Other Ambulatory Visit (HOSPITAL_COMMUNITY): Payer: Self-pay

## 2022-11-27 ENCOUNTER — Telehealth: Payer: Self-pay

## 2022-11-27 NOTE — Telephone Encounter (Signed)
Pharmacy Patient Advocate Encounter  Prior Authorization for Advair Diskus 100-50 MCG/ACT has been approved.    PA# [id number] Effective dates: 10/28/2022 through 11/27/2023  Spoke with Pharmacy to process.   Patient Advocate Encounter   Received notification from [employer] that prior authorization for Advair Diskus 100-50MCG/ACT is required.   PA submitted on 11/27/2022 Key [id number] Status is pending      Joneen Boers, [place] Patient Advocate Specialist [facility] Patient Advocate Team Direct Number: [phone] Fax: [phone]

## 2022-12-18 ENCOUNTER — Ambulatory Visit: Payer: BC Managed Care – PPO | Admitting: Internal Medicine

## 2022-12-18 ENCOUNTER — Encounter: Payer: Self-pay | Admitting: Internal Medicine

## 2022-12-18 VITALS — BP 138/80 | HR 86 | Ht 61.0 in | Wt 202.0 lb

## 2022-12-18 DIAGNOSIS — J4520 Mild intermittent asthma, uncomplicated: Secondary | ICD-10-CM

## 2022-12-18 DIAGNOSIS — M546 Pain in thoracic spine: Secondary | ICD-10-CM

## 2022-12-18 DIAGNOSIS — I10 Essential (primary) hypertension: Secondary | ICD-10-CM

## 2022-12-18 DIAGNOSIS — G5601 Carpal tunnel syndrome, right upper limb: Secondary | ICD-10-CM | POA: Diagnosis not present

## 2022-12-18 DIAGNOSIS — Z6835 Body mass index (BMI) 35.0-35.9, adult: Secondary | ICD-10-CM

## 2022-12-18 DIAGNOSIS — J069 Acute upper respiratory infection, unspecified: Secondary | ICD-10-CM | POA: Diagnosis not present

## 2022-12-18 MED ORDER — AZITHROMYCIN 250 MG PO TABS: ORAL_TABLET | ORAL | 0 refills | Status: AC

## 2022-12-18 NOTE — Progress Notes (Signed)
Established Patient Office Visit  Subjective:  Patient ID: Laura Espinoza, female    DOB: Aug 03, 1976  Age: 47 y.o. MRN: [id number]  CC:  Chief Complaint  Patient presents with   Follow-up    HPI  Laura Espinoza presents for check up  Past Medical History:  Diagnosis Date   Allergic rhinitis    Anxiety    Breast mass, right 11/11/2017   Nov 09, 2017, add'l views ordered   Chest pain    a. 12/2015 ETT: Ex time: 8:00, Max HR 169 (92%), BP 201/71, METS 10.1, No ecg changes.   Dyspnea on exertion    a. 12/2015 Echo: EF 60-65%, no rwma.   Gastritis    Hepatitis B    Hypertension    OM (otitis media), recurrent    as a child; ruptured TM   Pneumonia 2007   hospitalized for possible pneumonia in [place] for 2 weeks   Recurrent sinusitis     Past Surgical History:  Procedure Laterality Date   fallopian tube removal Left March 2014   INNER EAR SURGERY     LAPAROSCOPIC OVARIAN CYSTECTOMY  2008   in [place]   REFRACTIVE SURGERY Bilateral 1998   TYMPANOPLASTY WITH GRAFT Left 2017   [place], Dr. Ned Grace    Family History  Problem Relation Age of Onset   Hypertension Mother    Heart disease Mother    CAD Mother    Heart attack Mother    Arthritis Mother    CAD Father    Heart disease Father    Glaucoma Father    Cancer Father        kidney   Hypertension Brother    Breast cancer Paternal Aunt        late 78's- early 94's   Cancer Maternal Grandfather        unknown   Stroke Paternal Grandmother    Cancer Paternal Grandfather        lung?   COPD Neg Hx    Diabetes Neg Hx     Social History   Socioeconomic History   Marital status: Married    Spouse name: Not on file   Number of children: Not on file   Years of education: Not on file   Highest education level: Not on file  Occupational History   Not on file  Tobacco Use   Smoking status: Never   Smokeless tobacco: Never  Vaping Use   Vaping Use: Never used  Substance and Sexual Activity   Alcohol use:  Not Currently   Drug use: No   Sexual activity: Yes    Birth control/protection: Implant  Other Topics Concern   Not on file  Social History Narrative   Not on file   Social Determinants of Health   Financial Resource Strain: Not on file  Food Insecurity: Not on file  Transportation Needs: Not on file  Physical Activity: Not on file  Stress: Not on file  Social Connections: Not on file  Intimate Partner Violence: Not on file     Current Outpatient Medications:    amLODipine (NORVASC) 5 MG tablet, Take 1 tablet (5 mg total) by mouth daily., Disp: 90 tablet, Rfl: 1   etonogestrel (NEXPLANON) 68 MG IMPL implant, 1 each by Subdermal route once., Disp: , Rfl:    fluticasone-salmeterol (ADVAIR) 100-50 MCG/ACT AEPB, Inhale 2 puffs into the lungs 2 (two) times daily., Disp: 1 each, Rfl: 3   hydrochlorothiazide (MICROZIDE) 12.5 MG capsule, Take  12.5 mg by mouth as needed., Disp: , Rfl:    loratadine (CLARITIN) 10 MG tablet, Take 10 mg by mouth daily., Disp: , Rfl:    Multiple Vitamin (MULTI-VITAMIN) tablet, Take 1 tablet by mouth daily., Disp: , Rfl:    Allergies  Allergen Reactions   Procaine Anaphylaxis and Shortness Of Breath   Losartan Other (See Comments)    Had to stop, low pulse felt bad.     ROS Review of Systems  Constitutional: Negative.   HENT: Negative.    Eyes: Negative.   Respiratory: Negative.    Cardiovascular: Negative.   Gastrointestinal: Negative.   Endocrine: Negative.   Genitourinary: Negative.   Musculoskeletal: Negative.   Skin: Negative.   Allergic/Immunologic: Negative.   Neurological: Negative.   Hematological: Negative.   Psychiatric/Behavioral: Negative.    All other systems reviewed and are negative.     Objective:    Physical Exam Vitals reviewed.  Constitutional:      Appearance: Normal appearance.  HENT:     Mouth/Throat:     Mouth: Mucous membranes are moist.  Eyes:     Pupils: Pupils are equal, round, and reactive to light.   Neck:     Vascular: No carotid bruit.  Cardiovascular:     Rate and Rhythm: Normal rate and regular rhythm.     Pulses: Normal pulses.     Heart sounds: Normal heart sounds.  Pulmonary:     Effort: Pulmonary effort is normal.     Breath sounds: Normal breath sounds.  Abdominal:     General: Bowel sounds are normal.     Palpations: Abdomen is soft. There is no hepatomegaly, splenomegaly or mass.     Tenderness: There is no abdominal tenderness.     Hernia: No hernia is present.  Musculoskeletal:        General: No tenderness.     Cervical back: Neck supple.     Right lower leg: No edema.     Left lower leg: No edema.  Skin:    Findings: No rash.  Neurological:     Mental Status: She is alert and oriented to person, place, and time.     Motor: No weakness.  Psychiatric:        Mood and Affect: Mood and affect normal.        Behavior: Behavior normal.     BP 138/80   Pulse 86   Ht '5\' 1"'$  (1.549 m)   Wt 202 lb (91.6 kg)   SpO2 99%   BMI 38.17 kg/m  Wt Readings from Last 3 Encounters:  12/18/22 202 lb (91.6 kg)  11/13/22 199 lb (90.3 kg)  07/12/22 189 lb 11.2 oz (86 kg)     Health Maintenance Due  Topic Date Due   Hepatitis C Screening  Never done   COVID-19 Vaccine (3 - 2023-24 season) 08/11/2022   COLONOSCOPY (Pts 45-29yr Insurance coverage will need to be confirmed)  Never done   PAP SMEAR-Modifier  12/22/2022    There are no preventive care reminders to display for this patient.  Lab Results  Component Value Date   TSH 3.08 01/18/2021   Lab Results  Component Value Date   WBC 6.7 01/18/2021   HGB 14.8 01/18/2021   HCT 43.3 01/18/2021   MCV 90.8 01/18/2021   PLT 233 01/18/2021   Lab Results  Component Value Date   NA 141 04/18/2022   K 4.4 04/18/2022   CO2 21 04/18/2022   GLUCOSE 84 04/18/2022  BUN 11 04/18/2022   CREATININE 0.80 04/18/2022   BILITOT 0.8 04/18/2022   ALKPHOS 65 04/18/2022   AST 19 04/18/2022   ALT 17 04/18/2022   PROT 7.0  04/18/2022   ALBUMIN 4.5 04/18/2022   CALCIUM 9.6 04/18/2022   ANIONGAP 8 10/24/2018   EGFR 93 04/18/2022   Lab Results  Component Value Date   CHOL 172 01/18/2021   Lab Results  Component Value Date   HDL 67 01/18/2021   Lab Results  Component Value Date   LDLCALC 88 01/18/2021   Lab Results  Component Value Date   TRIG 83 01/18/2021   Lab Results  Component Value Date   CHOLHDL 2.6 01/18/2021   No results found for: "HGBA1C"    Assessment & Plan:   Problem List Items Addressed This Visit       Cardiovascular and Mediastinum   Primary hypertension     Patient denies any chest pain or shortness of breath there is no history of palpitation or paroxysmal nocturnal dyspnea   patient was advised to follow low-salt low-cholesterol diet    ideally I want to keep systolic blood pressure below 130 mmHg, patient was asked to check blood pressure one times a week and give me a report on that.  Patient will be follow-up in 3 months  or earlier as needed, patient will call me back for any change in the cardiovascular symptoms Patient was advised to buy a book from local bookstore concerning blood pressure and read several chapters  every day.  This will be supplemented by some of the material we will give him from the office.  Patient should also utilize other resources like YouTube and Internet to learn more about the blood pressure and the diet.        Respiratory   Mild intermittent asthma without complication   URI with cough and congestion - Primary    Will give a course of Z-Pak        Nervous and Auditory   Carpal tunnel syndrome, right upper limb    Stable at the present time        Other   Thoracic back pain    - Patient's back pain is under control with medication.  - Encouraged the patient to stretch or do yoga as able to help with back pain      BMI 35.0-35.9,adult    - I encouraged the patient to lose weight.  - I educated them on making healthy  dietary choices including eating more fruits and vegetables and less fried foods. - I encouraged the patient to exercise more, and educated on the benefits of exercise including weight loss, diabetes prevention, and hypertension prevention.   Dietary counseling with a registered dietician  Referral to a weight management support group (e.g. Weight Watchers, Overeaters Anonymous)  If your BMI is greater than 29 or you have gained more than 15 pounds you should work on weight loss.  Attend a healthy cooking class       No orders of the defined types were placed in this encounter.   Follow-up: No follow-ups on file.    Cletis Athens, MD

## 2022-12-18 NOTE — Assessment & Plan Note (Signed)
Stable at the present time. 

## 2022-12-18 NOTE — Assessment & Plan Note (Signed)
Will give a course of Z-Pak

## 2022-12-18 NOTE — Assessment & Plan Note (Signed)
Patient was advised to use Advair as directed

## 2022-12-18 NOTE — Assessment & Plan Note (Signed)
-   Patient's back pain is under control with medication.  - Encouraged the patient to stretch or do yoga as able to help with back pain 

## 2022-12-18 NOTE — Assessment & Plan Note (Signed)

## 2022-12-18 NOTE — Assessment & Plan Note (Signed)

## 2022-12-19 DIAGNOSIS — K7460 Unspecified cirrhosis of liver: Secondary | ICD-10-CM | POA: Diagnosis not present

## 2022-12-19 DIAGNOSIS — B181 Chronic viral hepatitis B without delta-agent: Secondary | ICD-10-CM | POA: Diagnosis not present

## 2022-12-19 DIAGNOSIS — Z1211 Encounter for screening for malignant neoplasm of colon: Secondary | ICD-10-CM | POA: Diagnosis not present

## 2023-01-11 ENCOUNTER — Other Ambulatory Visit: Payer: Self-pay | Admitting: Gastroenterology

## 2023-01-11 DIAGNOSIS — B181 Chronic viral hepatitis B without delta-agent: Secondary | ICD-10-CM

## 2023-01-17 ENCOUNTER — Ambulatory Visit
Admission: RE | Admit: 2023-01-17 | Discharge: 2023-01-17 | Disposition: A | Discharge status: Home / Self Care | Payer: BC Managed Care – PPO | Source: Ambulatory Visit | Attending: Gastroenterology | Admitting: Gastroenterology

## 2023-01-17 DIAGNOSIS — B181 Chronic viral hepatitis B without delta-agent: Secondary | ICD-10-CM | POA: Diagnosis not present

## 2023-01-17 DIAGNOSIS — K7460 Unspecified cirrhosis of liver: Secondary | ICD-10-CM | POA: Insufficient documentation

## 2023-03-02 DIAGNOSIS — B181 Chronic viral hepatitis B without delta-agent: Secondary | ICD-10-CM | POA: Diagnosis not present

## 2023-03-13 DIAGNOSIS — D2262 Melanocytic nevi of left upper limb, including shoulder: Secondary | ICD-10-CM | POA: Diagnosis not present

## 2023-03-13 DIAGNOSIS — D225 Melanocytic nevi of trunk: Secondary | ICD-10-CM | POA: Diagnosis not present

## 2023-03-13 DIAGNOSIS — L718 Other rosacea: Secondary | ICD-10-CM | POA: Diagnosis not present

## 2023-03-13 DIAGNOSIS — D2261 Melanocytic nevi of right upper limb, including shoulder: Secondary | ICD-10-CM | POA: Diagnosis not present

## 2023-03-20 ENCOUNTER — Encounter: Payer: Self-pay | Admitting: *Deleted

## 2023-03-23 ENCOUNTER — Encounter: Payer: Self-pay | Admitting: *Deleted

## 2023-03-26 ENCOUNTER — Ambulatory Visit
Admission: RE | Admit: 2023-03-26 | Discharge: 2023-03-26 | Disposition: A | Discharge status: Home / Self Care | Payer: BC Managed Care – PPO | Source: Ambulatory Visit | Attending: Gastroenterology | Admitting: Gastroenterology

## 2023-03-26 ENCOUNTER — Ambulatory Visit: Payer: BC Managed Care – PPO | Admitting: Registered Nurse

## 2023-03-26 ENCOUNTER — Encounter: Payer: Self-pay | Admitting: *Deleted

## 2023-03-26 ENCOUNTER — Encounter
Admission: RE | Disposition: A | Discharge status: Home / Self Care | Payer: Self-pay | Source: Ambulatory Visit | Attending: Gastroenterology

## 2023-03-26 DIAGNOSIS — Z1211 Encounter for screening for malignant neoplasm of colon: Secondary | ICD-10-CM | POA: Insufficient documentation

## 2023-03-26 DIAGNOSIS — B181 Chronic viral hepatitis B without delta-agent: Secondary | ICD-10-CM | POA: Diagnosis not present

## 2023-03-26 DIAGNOSIS — Z79899 Other long term (current) drug therapy: Secondary | ICD-10-CM | POA: Insufficient documentation

## 2023-03-26 DIAGNOSIS — K648 Other hemorrhoids: Secondary | ICD-10-CM | POA: Diagnosis not present

## 2023-03-26 DIAGNOSIS — K640 First degree hemorrhoids: Secondary | ICD-10-CM | POA: Insufficient documentation

## 2023-03-26 DIAGNOSIS — E669 Obesity, unspecified: Secondary | ICD-10-CM | POA: Insufficient documentation

## 2023-03-26 DIAGNOSIS — I10 Essential (primary) hypertension: Secondary | ICD-10-CM | POA: Insufficient documentation

## 2023-03-26 DIAGNOSIS — Z6838 Body mass index (BMI) 38.0-38.9, adult: Secondary | ICD-10-CM | POA: Diagnosis not present

## 2023-03-26 HISTORY — PX: COLONOSCOPY WITH PROPOFOL: SHX5780

## 2023-03-26 LAB — POCT PREGNANCY, URINE: Preg Test, Ur: NEGATIVE

## 2023-03-26 SURGERY — COLONOSCOPY WITH PROPOFOL
Anesthesia: General

## 2023-03-26 MED ORDER — PROPOFOL 10 MG/ML IV BOLUS
INTRAVENOUS | Status: DC | PRN
  Administered 2023-03-26: 80 mg via INTRAVENOUS

## 2023-03-26 MED ORDER — SODIUM CHLORIDE 0.9 % IV SOLN: INTRAVENOUS | Status: DC

## 2023-03-26 MED ORDER — PROPOFOL 500 MG/50ML IV EMUL
INTRAVENOUS | Status: DC | PRN
  Administered 2023-03-26: 125 ug/kg/min via INTRAVENOUS

## 2023-03-26 NOTE — Transfer of Care (Signed)
Immediate Anesthesia Transfer of Care Note  Patient: Laura Espinoza  Procedure(s) Performed: COLONOSCOPY WITH PROPOFOL  Patient Location: PACU  Anesthesia Type:General  Level of Consciousness: awake, alert , and oriented  Airway & Oxygen Therapy: Patient Spontanous Breathing  Post-op Assessment: Report given to RN and Post -op Vital signs reviewed and stable  Post vital signs: stable  Last Vitals:  Vitals Value Taken Time  BP 135/80 03/26/23 1338  Temp 36.4 C 03/26/23 1338  Pulse 82 03/26/23 1340  Resp 16 03/26/23 1340  SpO2 99 % 03/26/23 1340  Vitals shown include unvalidated device data.  Last Pain:  Vitals:   03/26/23 1338  TempSrc: Temporal  PainSc: 0-No pain         Complications: No notable events documented.

## 2023-03-26 NOTE — Op Note (Signed)
[facility] Gastroenterology Patient Name: Laura Espinoza Procedure Date: 03/26/2023 1:12 PM MRN: [id number] Account #: [id number] Date of Birth: 1976-11-11 Admit Type: Outpatient Age: 47 Room: [facility] ENDO ROOM 3 Gender: Female Note Status: Finalized Instrument Name: Nelda Marseille [id number] Procedure:             Colonoscopy Indications:           Screening for colorectal malignant neoplasm Providers:             Eather Colas MD, MD Referring MD:          Corky Downs, MD (Referring MD) Medicines:             Monitored Anesthesia Care Complications:         No immediate complications. Procedure:             Pre-Anesthesia Assessment:                        - Prior to the procedure, a History and Physical was                         performed, and patient medications and allergies were                         reviewed. The patient is competent. The risks and                         benefits of the procedure and the sedation options and                         risks were discussed with the patient. All questions                         were answered and informed consent was obtained.                         Patient identification and proposed procedure were                         verified by the physician, the nurse, the                         anesthesiologist, the anesthetist and the technician                         in the endoscopy suite. Mental Status Examination:                         alert and oriented. Airway Examination: normal                         oropharyngeal airway and neck mobility. Respiratory                         Examination: clear to auscultation. CV Examination:                         normal. Prophylactic Antibiotics: The patient does not  require prophylactic antibiotics. Prior                         Anticoagulants: The patient has taken no anticoagulant                         or antiplatelet agents. ASA Grade  Assessment: II - A                         patient with mild systemic disease. After reviewing                         the risks and benefits, the patient was deemed in                         satisfactory condition to undergo the procedure. The                         anesthesia plan was to use monitored anesthesia care                         (MAC). Immediately prior to administration of                         medications, the patient was re-assessed for adequacy                         to receive sedatives. The heart rate, respiratory                         rate, oxygen saturations, blood pressure, adequacy of                         pulmonary ventilation, and response to care were                         monitored throughout the procedure. The physical                         status of the patient was re-assessed after the                         procedure.                        After obtaining informed consent, the colonoscope was                         passed under direct vision. Throughout the procedure,                         the patient's blood pressure, pulse, and oxygen                         saturations were monitored continuously. The                         Colonoscope was introduced through the anus and  advanced to the the terminal ileum. The colonoscopy                         was performed without difficulty. The patient                         tolerated the procedure well. The quality of the bowel                         preparation was good. The terminal ileum, ileocecal                         valve, appendiceal orifice, and rectum were                         photographed. Findings:      The perianal and digital rectal examinations were normal.      The terminal ileum appeared normal.      Internal hemorrhoids were found during retroflexion. The hemorrhoids       were Grade I (internal hemorrhoids that do not prolapse).      The exam was  otherwise without abnormality on direct and retroflexion       views. Impression:            - The examined portion of the ileum was normal.                        - Internal hemorrhoids.                        - The examination was otherwise normal on direct and                         retroflexion views.                        - No specimens collected. Recommendation:        - Discharge patient to home.                        - Resume previous diet.                        - Continue present medications.                        - Repeat colonoscopy in 10 years for screening                         purposes.                        - Return to referring physician as previously                         scheduled. Procedure Code(s):     --- Professional ---                        [id number], Colorectal cancer screening; colonoscopy on  individual not meeting criteria for high risk Diagnosis Code(s):     --- Professional ---                        Z12.11, Encounter for screening for malignant neoplasm                         of colon                        K64.0, First degree hemorrhoids CPT copyright 2022 [employer]. All rights reserved. The codes documented in this report are preliminary and upon coder review may  be revised to meet current compliance requirements. Eather Colas MD, MD 03/26/2023 1:38:32 PM Number of Addenda: 0 Note Initiated On: 03/26/2023 1:12 PM Scope Withdrawal Time: 0 hours 7 minutes 41 seconds  Total Procedure Duration: 0 hours 12 minutes 24 seconds  Estimated Blood Loss:  Estimated blood loss: none.      [facility]

## 2023-03-26 NOTE — Anesthesia Preprocedure Evaluation (Signed)
Anesthesia Evaluation  Patient identified by MRN, date of birth, ID band Patient awake    Reviewed: Allergy & Precautions, NPO status , Patient's Chart, lab work & pertinent test results  History of Anesthesia Complications Negative for: history of anesthetic complications  Airway Mallampati: II  TM Distance: >3 FB Neck ROM: full    Dental no notable dental hx.    Pulmonary asthma    Pulmonary exam normal        Cardiovascular hypertension, On Medications negative cardio ROS Normal cardiovascular exam     Neuro/Psych  PSYCHIATRIC DISORDERS Anxiety      Neuromuscular disease    GI/Hepatic negative GI ROS,,,(+) Hepatitis -, B  Endo/Other  negative endocrine ROS    Renal/GU negative Renal ROS  negative genitourinary   Musculoskeletal   Abdominal   Peds  Hematology negative hematology ROS (+)   Anesthesia Other Findings Past Medical History: No date: Allergic rhinitis No date: Anxiety 11/11/2017: Breast mass, right     Comment:  Nov 09, 2017, add'l views ordered No date: Chest pain     Comment:  a. 12/2015 ETT: Ex time: 8:00, Max HR 169 (92%), BP               201/71, METS 10.1, No ecg changes. No date: Dyspnea on exertion     Comment:  a. 12/2015 Echo: EF 60-65%, no rwma. No date: Gastritis No date: Hepatitis B No date: Hypertension No date: OM (otitis media), recurrent     Comment:  as a child; ruptured TM 2007: Pneumonia     Comment:  hospitalized for possible pneumonia in [place] for 2               weeks No date: Recurrent sinusitis  Past Surgical History: No date: DIAGNOSTIC LAPAROSCOPY No date: EYE SURGERY 02/2013: fallopian tube removal; Left No date: INNER EAR SURGERY 2008: LAPAROSCOPIC OVARIAN CYSTECTOMY     Comment:  in [place] 1998: REFRACTIVE SURGERY; Bilateral 2017: TYMPANOPLASTY WITH GRAFT; Left     Comment:  [place], Dr. Ulla Potash     Reproductive/Obstetrics negative OB ROS                              Anesthesia Physical Anesthesia Plan  ASA: 2  Anesthesia Plan: General   Post-op Pain Management: Minimal or no pain anticipated   Induction: Intravenous  PONV Risk Score and Plan: Propofol infusion and TIVA  Airway Management Planned: Natural Airway and Nasal Cannula  Additional Equipment:   Intra-op Plan:   Post-operative Plan:   Informed Consent: I have reviewed the patients History and Physical, chart, labs and discussed the procedure including the risks, benefits and alternatives for the proposed anesthesia with the patient or authorized representative who has indicated his/her understanding and acceptance.     Dental Advisory Given  Plan Discussed with: Anesthesiologist, CRNA and Surgeon  Anesthesia Plan Comments: (Patient consented for risks of anesthesia including but not limited to:  - adverse reactions to medications - risk of airway placement if required - damage to eyes, teeth, lips or other oral mucosa - nerve damage due to positioning  - sore throat or hoarseness - Damage to heart, brain, nerves, lungs, other parts of body or loss of life  Patient voiced understanding.)       Anesthesia Quick Evaluation

## 2023-03-26 NOTE — H&P (Signed)
Outpatient short stay form Pre-procedure 03/26/2023  Regis Bill, MD  Primary Physician: Corky Downs, MD  Reason for visit:  Screening  History of present illness:    47 y/o lady with history of chronic hepatitis B and obesity here for index screening colonoscopy. No blood thinners. No family history of GI malignancies. History of laprascopic cyst removal.    Current Facility-Administered Medications:    0.9 %  sodium chloride infusion, , Intravenous, Continuous, Elena Davia, Rossie Muskrat, MD, Last Rate: 20 mL/hr at 03/26/23 1241, New Bag at 03/26/23 1241  Medications Prior to Admission  Medication Sig Dispense Refill Last Dose   amLODipine (NORVASC) 5 MG tablet Take 1 tablet (5 mg total) by mouth daily. 90 tablet 1 03/25/2023   cetirizine (ZYRTEC) 10 MG tablet Take 10 mg by mouth daily.   03/25/2023   etonogestrel (NEXPLANON) 68 MG IMPL implant 1 each by Subdermal route once.   03/26/2023   fluticasone-salmeterol (ADVAIR) 100-50 MCG/ACT AEPB Inhale 2 puffs into the lungs 2 (two) times daily. 1 each 3 03/26/2023 at 0700   hydrochlorothiazide (MICROZIDE) 12.5 MG capsule Take 12.5 mg by mouth as needed.   Past Month   Multiple Vitamin (MULTI-VITAMIN) tablet Take 1 tablet by mouth daily.   Past Week   omeprazole (PRILOSEC) 20 MG capsule Take 20 mg by mouth daily.   03/25/2023   loratadine (CLARITIN) 10 MG tablet Take 10 mg by mouth daily. (Patient not taking: Reported on 03/26/2023)   Not Taking     Allergies  Allergen Reactions   Procaine Anaphylaxis and Shortness Of Breath   Losartan Other (See Comments)    Had to stop, low pulse felt bad.      Past Medical History:  Diagnosis Date   Allergic rhinitis    Anxiety    Breast mass, right 11/11/2017   Nov 09, 2017, add'l views ordered   Chest pain    a. 12/2015 ETT: Ex time: 8:00, Max HR 169 (92%), BP 201/71, METS 10.1, No ecg changes.   Dyspnea on exertion    a. 12/2015 Echo: EF 60-65%, no rwma.   Gastritis    Hepatitis B     Hypertension    OM (otitis media), recurrent    as a child; ruptured TM   Pneumonia 2007   hospitalized for possible pneumonia in [place] for 2 weeks   Recurrent sinusitis     Review of systems:  Otherwise negative.    Physical Exam  Gen: Alert, oriented. Appears stated age.  HEENT: PERRLA. Lungs: No respiratory distress CV: RRR Abd: soft, benign, no masses Ext: No edema    Planned procedures: Proceed with colonoscopy. The patient understands the nature of the planned procedure, indications, risks, alternatives and potential complications including but not limited to bleeding, infection, perforation, damage to internal organs and possible oversedation/side effects from anesthesia. The patient agrees and gives consent to proceed.  Please refer to procedure notes for findings, recommendations and patient disposition/instructions.     Regis Bill, MD [facility]

## 2023-03-26 NOTE — Interval H&P Note (Signed)
History and Physical Interval Note:  03/26/2023 1:13 PM  Laura Espinoza  has presented today for surgery, with the diagnosis of CCA SCREEN.  The various methods of treatment have been discussed with the patient and family. After consideration of risks, benefits and other options for treatment, the patient has consented to  Procedure(s): COLONOSCOPY WITH PROPOFOL (N/A) as a surgical intervention.  The patient's history has been reviewed, patient examined, no change in status, stable for surgery.  I have reviewed the patient's chart and labs.  Questions were answered to the patient's satisfaction.     Regis Bill  Ok to proceed with colonoscopy

## 2023-03-27 ENCOUNTER — Encounter: Payer: Self-pay | Admitting: Gastroenterology

## 2023-03-29 NOTE — Anesthesia Postprocedure Evaluation (Addendum)
Anesthesia Post Note  Patient: Laura Espinoza  Procedure(s) Performed: COLONOSCOPY WITH PROPOFOL  Patient location during evaluation: Endoscopy Anesthesia Type: General Level of consciousness: awake and alert Pain management: pain level controlled Vital Signs Assessment: post-procedure vital signs reviewed and stable Respiratory status: spontaneous breathing, nonlabored ventilation, respiratory function stable and patient connected to nasal cannula oxygen Cardiovascular status: blood pressure returned to baseline and stable Postop Assessment: no apparent nausea or vomiting Anesthetic complications: no   No notable events documented.   Last Vitals:  Vitals:   03/26/23 1348 03/26/23 1358  BP: 128/86 123/80  Pulse: 82 74  Resp: 15 14  Temp:    SpO2: 100% 100%    Last Pain:  Vitals:   03/26/23 1358  TempSrc:   PainSc: 0-No pain                 Louie Boston

## 2023-04-03 ENCOUNTER — Ambulatory Visit: Payer: BC Managed Care – PPO | Admitting: Nurse Practitioner

## 2023-04-06 ENCOUNTER — Ambulatory Visit: Payer: BC Managed Care – PPO | Admitting: Nurse Practitioner

## 2023-04-17 ENCOUNTER — Ambulatory Visit: Payer: BC Managed Care – PPO | Admitting: Nurse Practitioner

## 2023-04-19 ENCOUNTER — Ambulatory Visit: Payer: BC Managed Care – PPO | Admitting: Nurse Practitioner

## 2023-04-19 ENCOUNTER — Encounter: Payer: Self-pay | Admitting: Nurse Practitioner

## 2023-04-19 VITALS — BP 124/70 | HR 89 | Temp 98.9°F | Ht 62.0 in | Wt 201.0 lb

## 2023-04-19 DIAGNOSIS — J0100 Acute maxillary sinusitis, unspecified: Secondary | ICD-10-CM | POA: Diagnosis not present

## 2023-04-19 DIAGNOSIS — B181 Chronic viral hepatitis B without delta-agent: Secondary | ICD-10-CM | POA: Diagnosis not present

## 2023-04-19 DIAGNOSIS — I10 Essential (primary) hypertension: Secondary | ICD-10-CM

## 2023-04-19 DIAGNOSIS — J4520 Mild intermittent asthma, uncomplicated: Secondary | ICD-10-CM | POA: Diagnosis not present

## 2023-04-19 DIAGNOSIS — Z1231 Encounter for screening mammogram for malignant neoplasm of breast: Secondary | ICD-10-CM

## 2023-04-19 MED ORDER — AMLODIPINE BESYLATE 5 MG PO TABS: 5.0000 mg | ORAL_TABLET | Freq: Every day | ORAL | 3 refills | Status: DC

## 2023-04-19 MED ORDER — AZITHROMYCIN 250 MG PO TABS
ORAL_TABLET | ORAL | 0 refills | Status: AC
Start: 2023-04-19 — End: 2023-04-24

## 2023-04-19 MED ORDER — FLUTICASONE-SALMETEROL 100-50 MCG/ACT IN AEPB
1.0000 | INHALATION_SPRAY | Freq: Two times a day (BID) | RESPIRATORY_TRACT | 11 refills | Status: DC
Start: 2023-04-19 — End: 2024-01-17

## 2023-04-19 MED ORDER — ALBUTEROL SULFATE HFA 108 (90 BASE) MCG/ACT IN AERS
2.0000 | INHALATION_SPRAY | Freq: Four times a day (QID) | RESPIRATORY_TRACT | 2 refills | Status: AC | PRN
Start: 2023-04-19 — End: ?

## 2023-04-19 NOTE — Assessment & Plan Note (Signed)
Symptoms and duration consistent with sinusitis. Will treat with Zpak, as patient reports they usually do not clear completely with Augmentin or Amoxicillin. Advised adequate fluid intake. She can continue Mucinex as needed. She will contact if her symptoms do not improve or are worsening.

## 2023-04-19 NOTE — Assessment & Plan Note (Signed)
Chronic. Stable on Norvasc 5 mg daily and HCTZ 12.5 mg PRN. Continue. Refills sent. Encouraged to continue checking blood pressure daily and contact if consistently elevated over 130/90. Will continue to monitor.

## 2023-04-19 NOTE — Patient Instructions (Signed)
YOUR MAMMOGRAM IS DUE, PLEASE CALL AND GET THIS SCHEDULED! [facility] - call [phone]    

## 2023-04-19 NOTE — Progress Notes (Signed)
Bethanie Dicker, NP-C Phone: [phone]  Laura Espinoza is a 47 y.o. female who presents today to establish care and for sinus problems.   Patient reports worsening drainage and congestion over the last 2 weeks. She reports sinus pressure, green mucus and ear fullness. Respiratory illness:  Cough- Yes  Congestion-    Sinus- Yes, nasal, pressure   Chest- No  Post nasal drip- Yes  Sore throat- No  Shortness of breath- No  Fever- No  Fatigue/Myalgia- Yes Headache- Yes Nausea/Vomiting- No Taste disturbance- No  Smell disturbance- No  Covid exposure- No  Covid vaccination- x 2  Flu vaccination- Not due  Medications- Sudafed, Dayquil and Nyquil  HYPERTENSION Disease Monitoring Home BP Monitoring- 125-130/80 Chest pain- No    Dyspnea- No Medications Compliance-  Norvasc and HCTZ. Lightheadedness-  No  Edema- No BMET    Component Value Date/Time   NA 141 04/18/2022 1110   K 4.4 04/18/2022 1110   CL 105 04/18/2022 1110   CO2 21 04/18/2022 1110   GLUCOSE 84 04/18/2022 1110   GLUCOSE 84 01/18/2021 1236   BUN 11 04/18/2022 1110   CREATININE 0.80 04/18/2022 1110   CREATININE 0.72 01/18/2021 1236   CALCIUM 9.6 04/18/2022 1110   GFRNONAA 103 01/18/2021 1236   GFRAA 119 01/18/2021 1236     Active Ambulatory Problems    Diagnosis Date Noted   Allergic rhinitis    Anxiety    Superficial gastritis without hemorrhage    Thoracic back pain 11/17/2015   Preventative health care 11/17/2015   HBV (hepatitis B virus) infection 06/04/2014   Perforated tympanic membrane 04/09/2016   Encounter for annual physical exam 09/17/2017   Breast mass, right 11/11/2017   BMI 35.0-35.9,adult 03/19/2018   Carpal tunnel syndrome, left upper limb 09/17/2018   Carpal tunnel syndrome, right upper limb 09/17/2018   Urinary incontinence 10/17/2018   Primary hypertension 01/07/2021   Solitary bone cyst of lower leg, right 04/21/2021   Mild intermittent asthma without complication 07/12/2022    Acute non-recurrent maxillary sinusitis 04/19/2023   Resolved Ambulatory Problems    Diagnosis Date Noted   Chest pain at rest 11/17/2015   Right knee pain 11/17/2015   Shortness of breath 11/17/2015   Breast pain in female 09/15/2016   Breast lump on left side at 9 o'clock position 09/15/2016   Need for hepatitis A vaccination 09/15/2016   Asthma 09/15/2016   Vaccine for streptococcus pneumoniae and influenza 09/17/2017   Supervision of high risk pregnancy, antepartum 03/19/2018   Supervision of pregnancy resulting from assisted reproductive technology 03/19/2018   Twin gestation in first trimester 03/19/2018   Obesity affecting pregnancy 03/19/2018   History of preterm delivery, currently pregnant 03/19/2018   Nausea and vomiting during pregnancy 03/19/2018   AMA (advanced maternal age) multigravida 35+, third trimester 08/19/2018   Indication for care in labor and delivery, antepartum 10/03/2018   Normal labor and delivery 10/17/2018   Dermoid cyst of leg, right 04/21/2021   Hepatitis B carrier (HCC) 07/12/2022   Sprain of fifth toe of left foot 07/12/2022   URI with cough and congestion 12/18/2022   Past Medical History:  Diagnosis Date   Chest pain    Chicken pox    Dyspnea on exertion    Gastritis    GERD (gastroesophageal reflux disease)    Hepatitis B    Hypertension    OM (otitis media), recurrent    Pneumonia 2007   Recurrent sinusitis     Family History  Problem  Relation Age of Onset   Hypertension Mother    Heart disease Mother    CAD Mother    Heart attack Mother    Arthritis Mother    CAD Father    Heart disease Father    Glaucoma Father    Cancer Father        kidney   Hypertension Brother    Breast cancer Paternal Aunt        late 3's- early 17's   Cancer Maternal Grandfather        unknown   Stroke Paternal Grandmother    Cancer Paternal Grandfather        lung?   COPD Neg Hx    Diabetes Neg Hx     Social History   Socioeconomic  History   Marital status: Married    Spouse name: Not on file   Number of children: Not on file   Years of education: Not on file   Highest education level: Not on file  Occupational History   Not on file  Tobacco Use   Smoking status: Never   Smokeless tobacco: Never  Vaping Use   Vaping Use: Never used  Substance and Sexual Activity   Alcohol use: Not Currently   Drug use: No   Sexual activity: Yes    Birth control/protection: Implant  Other Topics Concern   Not on file  Social History Narrative   Not on file   Social Determinants of Health   Financial Resource Strain: Not on file  Food Insecurity: Not on file  Transportation Needs: Not on file  Physical Activity: Not on file  Stress: Not on file  Social Connections: Not on file  Intimate Partner Violence: Not on file    ROS  General:  Negative for unexplained weight loss, fever Skin: Negative for new or changing mole, sore that won't heal HEENT: Negative for trouble hearing, trouble seeing, ringing in ears, mouth sores, hoarseness, change in voice, dysphagia. CV:  Negative for chest pain, dyspnea, edema, palpitations Resp: Negative for dyspnea, hemoptysis GI: Negative for nausea, vomiting, diarrhea, constipation, abdominal pain, melena, hematochezia. GU: Negative for dysuria, incontinence, urinary hesitance, hematuria, vaginal or penile discharge, polyuria, sexual difficulty, lumps in testicle or breasts MSK: Negative for muscle cramps or aches, joint pain or swelling Neuro: Negative for weakness, numbness, dizziness, passing out/fainting Psych: Negative for depression, anxiety, memory problems  Objective  Physical Exam Vitals:   04/19/23 1005  BP: 124/70  Pulse: 89  Temp: 98.9 F (37.2 C)  SpO2: 97%    BP Readings from Last 3 Encounters:  04/19/23 124/70  03/26/23 123/80  12/18/22 138/80   Wt Readings from Last 3 Encounters:  04/19/23 201 lb (91.2 kg)  03/26/23 202 lb (91.6 kg)  12/18/22 202 lb  (91.6 kg)    Physical Exam Constitutional:      General: She is not in acute distress.    Appearance: Normal appearance.  HENT:     Head: Normocephalic.     Right Ear: Tympanic membrane normal.     Left Ear: Tympanic membrane normal.     Nose: Congestion present.     Right Sinus: Maxillary sinus tenderness present.     Left Sinus: Maxillary sinus tenderness present.     Mouth/Throat:     Mouth: Mucous membranes are moist.     Pharynx: Oropharynx is clear.  Eyes:     Conjunctiva/sclera: Conjunctivae normal.     Pupils: Pupils are equal, round, and reactive to light.  Cardiovascular:     Rate and Rhythm: Normal rate and regular rhythm.     Heart sounds: Normal heart sounds.  Pulmonary:     Effort: Pulmonary effort is normal.     Breath sounds: Normal breath sounds.  Abdominal:     General: Abdomen is flat. Bowel sounds are normal.     Palpations: Abdomen is soft. There is no mass.     Tenderness: There is no abdominal tenderness.  Lymphadenopathy:     Cervical: No cervical adenopathy.  Skin:    General: Skin is warm and dry.  Neurological:     General: No focal deficit present.     Mental Status: She is alert.  Psychiatric:        Mood and Affect: Mood normal.        Behavior: Behavior normal.    Assessment/Plan:   Acute non-recurrent maxillary sinusitis Assessment & Plan: Symptoms and duration consistent with sinusitis. Will treat with Zpak, as patient reports they usually do not clear completely with Augmentin or Amoxicillin. Advised adequate fluid intake. She can continue Mucinex as needed. She will contact if her symptoms do not improve or are worsening.   Orders: -     Azithromycin; Take 2 tablets on day 1, then 1 tablet daily on days 2 through 5  Dispense: 6 tablet; Refill: 0  Primary hypertension Assessment & Plan: Chronic. Stable on Norvasc 5 mg daily and HCTZ 12.5 mg PRN. Continue. Refills sent. Encouraged to continue checking blood pressure daily and  contact if consistently elevated over 130/90. Will continue to monitor.   Orders: -     amLODIPine Besylate; Take 1 tablet (5 mg total) by mouth daily.  Dispense: 90 tablet; Refill: 3  Mild intermittent asthma without complication Assessment & Plan: Chronic. Stable on Advair inhaler twice daily and Albuterol PRN. Continue. Refills sent.   Orders: -     Fluticasone-Salmeterol; Inhale 1 puff into the lungs 2 (two) times daily.  Dispense: 60 each; Refill: 11 -     Albuterol Sulfate HFA; Inhale 2 puffs into the lungs every 6 (six) hours as needed for wheezing or shortness of breath.  Dispense: 8 g; Refill: 2  Chronic viral hepatitis B without delta agent and without coma (HCC) Assessment & Plan: Stable. Followed by GI. Had recent office visit and [place] in February.    Screening mammogram for breast cancer -     3D Screening Mammogram, Left and Right; Future    Return in about 4 weeks (around 05/17/2023) for Annual Exam.   Bethanie Dicker, NP-C  Primary Care - [employer]

## 2023-04-19 NOTE — Assessment & Plan Note (Signed)
Stable. Followed by GI. Had recent office visit and [place] in February.

## 2023-04-19 NOTE — Assessment & Plan Note (Signed)
Chronic. Stable on Advair inhaler twice daily and Albuterol PRN. Continue. Refills sent.

## 2023-05-21 NOTE — Progress Notes (Unsigned)
Bethanie Dicker, NP-C Phone: [phone]  Laura Espinoza is a 47 y.o. female who presents today for annual exam and pap smear. She has no complaints or new concerns today. She is doing well on all of her medications.   Diet: Well rounded- trying to decrease sweets, has a garden, loves vegetables Exercise: None Pap smear: 12/23/2019- Due! Colonoscopy: 03/26/2023- 10 year recall Mammogram: 05/23/2022- scheduled for 05/30/2023 Family history-  Colon cancer: No  Breast cancer: Yes, paternal aunt  Ovarian cancer: No Vaccines-   Flu: Not due  Tetanus: 08/19/2018  COVID19: x 2 HIV screening: Negative Hep C Screening: Deferred Tobacco use: No Alcohol use: Yes, once a week Illicit Drug use: No Dentist: Yes Ophthalmology: No   [id number] Wt Readings from Last 3 Encounters:  05/22/23 200 lb (90.7 kg)  04/19/23 201 lb (91.2 kg)  03/26/23 202 lb (91.6 kg)   Last period:  Unknown- patient has Nexplanon implant Regular periods: No Heavy bleeding: NA  Sexually active: Yes Birth control or hormonal therapy: Nexplanon Hx of STD: Patient desires STD screening Dyspareunia: No Hot flashes: No Vaginal discharge: No Dysuria: No   Breast mass or concerns: No History of abnormal pap: No    Social History   Tobacco Use  Smoking Status Never  Smokeless Tobacco Never    Current Outpatient Medications on File Prior to Visit  Medication Sig Dispense Refill   albuterol (VENTOLIN HFA) 108 (90 Base) MCG/ACT inhaler Inhale 2 puffs into the lungs every 6 (six) hours as needed for wheezing or shortness of breath. 8 g 2   amLODipine (NORVASC) 5 MG tablet Take 1 tablet (5 mg total) by mouth daily. 90 tablet 3   etonogestrel (NEXPLANON) 68 MG IMPL implant 1 each by Subdermal route once.     fluticasone-salmeterol (ADVAIR) 100-50 MCG/ACT AEPB Inhale 1 puff into the lungs 2 (two) times daily. 60 each 11   hydrochlorothiazide (MICROZIDE) 12.5 MG capsule Take 12.5 mg by mouth as needed.      metroNIDAZOLE (METROGEL) 1 % gel SMARTSIG:Sparingly Topical Daily     omeprazole (PRILOSEC) 20 MG capsule Take 20 mg by mouth daily.     No current facility-administered medications on file prior to visit.    ROS see history of present illness  Objective  Physical Exam Vitals:   05/22/23 0837  BP: 128/70  Pulse: 76  Temp: 98.1 F (36.7 C)  SpO2: 98%    BP Readings from Last 3 Encounters:  05/22/23 128/70  04/19/23 124/70  03/26/23 123/80   Wt Readings from Last 3 Encounters:  05/22/23 200 lb (90.7 kg)  04/19/23 201 lb (91.2 kg)  03/26/23 202 lb (91.6 kg)    Physical Exam Exam conducted with a chaperone present Donavan Foil, CMA).  Constitutional:      General: She is not in acute distress.    Appearance: Normal appearance.  HENT:     Head: Normocephalic.     Right Ear: Tympanic membrane normal.     Left Ear: Tympanic membrane normal.     Nose: Nose normal.     Mouth/Throat:     Mouth: Mucous membranes are moist.     Pharynx: Oropharynx is clear.  Eyes:     Conjunctiva/sclera: Conjunctivae normal.     Pupils: Pupils are equal, round, and reactive to light.  Neck:     Thyroid: No thyromegaly.  Cardiovascular:     Rate and Rhythm: Normal rate and regular rhythm.     Heart sounds: Normal heart sounds.  Pulmonary:     Effort: Pulmonary effort is normal.     Breath sounds: Normal breath sounds.  Abdominal:     General: Abdomen is flat. Bowel sounds are normal.     Palpations: Abdomen is soft. There is no mass.     Tenderness: There is no abdominal tenderness.  Genitourinary:    General: Normal vulva.     Labia:        Right: No rash or lesion.        Left: No rash or lesion.      Vagina: Normal. No vaginal discharge, tenderness or bleeding.     Cervix: No discharge, lesion or cervical bleeding.     Uterus: Normal.   Musculoskeletal:        General: Normal range of motion.  Lymphadenopathy:     Cervical: No cervical adenopathy.  Skin:    General:  Skin is warm and dry.     Findings: No rash.  Neurological:     General: No focal deficit present.     Mental Status: She is alert.  Psychiatric:        Mood and Affect: Mood normal.        Behavior: Behavior normal.    Assessment/Plan: Please see individual problem list.  Well woman exam with routine gynecological exam Assessment & Plan: Physical exam complete. Lab work as outlined. Will contact patient with results. Pap- today! Colonoscopy- UTD. Mammogram- scheduled for 05/30/2023, encouraged patient to complete. Flu vaccine not due. Tetanus vaccine- UTD. Declined additional COVID vaccines. HIV screening negative. Hep C screening deferred today. Recommended follow up with Dentist and establishing with Ophthalmology for annual exams. Encouraged healthy diet and to begin exercising. Return to care in 6 months, sooner PRN.    Primary hypertension Assessment & Plan: Chronic. Stable on Norvasc 5 mg daily and HCTZ 12.5 mg PRN. Continue.   Orders: -     CBC with Differential/Platelet -     VITAMIN D 25 Hydroxy (Vit-D Deficiency, Fractures)  Non-seasonal allergic rhinitis, unspecified trigger Assessment & Plan: Continues to have drainage and allergy symptoms. No longer taking Zyrtec daily. Advised to try different antihistamine daily such as Allegra or Xyzal. She has a nasal spray at home she uses daily, she can continue this. Encouraged adequate fluid intake. Discussed referral to ENT/Allergist if symptoms persist.   Hepatitis B carrier [facility]) Assessment & Plan: Will check hepatic function today. Follow up with Laurette Schimke in July as scheduled.   Orders: -     Basic metabolic panel -     Hepatic function panel  Obesity (BMI 30-39.9) Assessment & Plan: Will check A1c today. Encouraged healthy diet and exercise.   Orders: -     Hemoglobin A1c  Screening for cervical cancer -     Cytology - PAP  Thyroid disorder screen -     TSH  Lipid screening -     Lipid panel    Return  in about 6 months (around 11/21/2023) for Follow up.   Bethanie Dicker, NP-C [place] [facility] - [employer]

## 2023-05-22 ENCOUNTER — Ambulatory Visit (INDEPENDENT_AMBULATORY_CARE_PROVIDER_SITE_OTHER): Payer: BC Managed Care – PPO | Admitting: Nurse Practitioner

## 2023-05-22 ENCOUNTER — Other Ambulatory Visit (HOSPITAL_COMMUNITY)
Admission: RE | Admit: 2023-05-22 | Discharge: 2023-05-22 | Disposition: A | Discharge status: Home / Self Care | Payer: BC Managed Care – PPO | Source: Ambulatory Visit | Attending: Nurse Practitioner | Admitting: Nurse Practitioner

## 2023-05-22 ENCOUNTER — Encounter: Payer: Self-pay | Admitting: Nurse Practitioner

## 2023-05-22 VITALS — BP 128/70 | HR 76 | Temp 98.1°F | Ht 62.0 in | Wt 200.0 lb

## 2023-05-22 DIAGNOSIS — Z124 Encounter for screening for malignant neoplasm of cervix: Secondary | ICD-10-CM | POA: Diagnosis not present

## 2023-05-22 DIAGNOSIS — B181 Chronic viral hepatitis B without delta-agent: Secondary | ICD-10-CM | POA: Diagnosis not present

## 2023-05-22 DIAGNOSIS — Z01419 Encounter for gynecological examination (general) (routine) without abnormal findings: Secondary | ICD-10-CM

## 2023-05-22 DIAGNOSIS — Z0000 Encounter for general adult medical examination without abnormal findings: Secondary | ICD-10-CM

## 2023-05-22 DIAGNOSIS — Z1329 Encounter for screening for other suspected endocrine disorder: Secondary | ICD-10-CM | POA: Diagnosis not present

## 2023-05-22 DIAGNOSIS — J3089 Other allergic rhinitis: Secondary | ICD-10-CM

## 2023-05-22 DIAGNOSIS — E669 Obesity, unspecified: Secondary | ICD-10-CM | POA: Insufficient documentation

## 2023-05-22 DIAGNOSIS — Z13220 Encounter for screening for lipoid disorders: Secondary | ICD-10-CM | POA: Diagnosis not present

## 2023-05-22 DIAGNOSIS — I10 Essential (primary) hypertension: Secondary | ICD-10-CM

## 2023-05-22 LAB — HEPATIC FUNCTION PANEL
ALT: 13 U/L (ref 0–35)
AST: 16 U/L (ref 0–37)
Albumin: 4.2 g/dL (ref 3.5–5.2)
Alkaline Phosphatase: 62 U/L (ref 39–117)
Bilirubin, Direct: 0.1 mg/dL (ref 0.0–0.3)
Total Bilirubin: 0.8 mg/dL (ref 0.2–1.2)
Total Protein: 7 g/dL (ref 6.0–8.3)

## 2023-05-22 LAB — CBC WITH DIFFERENTIAL/PLATELET
Basophils Absolute: 0 10*3/uL (ref 0.0–0.1)
Basophils Relative: 0.5 % (ref 0.0–3.0)
Eosinophils Absolute: 0.1 10*3/uL (ref 0.0–0.7)
Eosinophils Relative: 2.3 % (ref 0.0–5.0)
HCT: 42.8 % (ref 36.0–46.0)
Hemoglobin: 14.2 g/dL (ref 12.0–15.0)
Lymphocytes Relative: 22 % (ref 12.0–46.0)
Lymphs Abs: 1.2 10*3/uL (ref 0.7–4.0)
MCHC: 33.3 g/dL (ref 30.0–36.0)
MCV: 88.6 fl (ref 78.0–100.0)
Monocytes Absolute: 0.4 10*3/uL (ref 0.1–1.0)
Monocytes Relative: 7.9 % (ref 3.0–12.0)
Neutro Abs: 3.8 10*3/uL (ref 1.4–7.7)
Neutrophils Relative %: 67.3 % (ref 43.0–77.0)
Platelets: 286 10*3/uL (ref 150.0–400.0)
RBC: 4.83 Mil/uL (ref 3.87–5.11)
RDW: 13.5 % (ref 11.5–15.5)
WBC: 5.6 10*3/uL (ref 4.0–10.5)

## 2023-05-22 LAB — BASIC METABOLIC PANEL
BUN: 7 mg/dL (ref 6–23)
CO2: 27 mEq/L (ref 19–32)
Calcium: 9.1 mg/dL (ref 8.4–10.5)
Chloride: 106 mEq/L (ref 96–112)
Creatinine, Ser: 0.79 mg/dL (ref 0.40–1.20)
GFR: 90.2 mL/min (ref >60.00)
Glucose, Bld: 87 mg/dL (ref 70–99)
Potassium: 3.9 mEq/L (ref 3.5–5.1)
Sodium: 138 mEq/L (ref 135–145)

## 2023-05-22 LAB — LIPID PANEL
Cholesterol: 165 mg/dL (ref 0–200)
HDL: 55.3 mg/dL (ref >39.00)
LDL Cholesterol: 89 mg/dL (ref 0–99)
NonHDL: 109.81
Total CHOL/HDL Ratio: 3
Triglycerides: 102 mg/dL (ref 0.0–149.0)
VLDL: 20.4 mg/dL (ref 0.0–40.0)

## 2023-05-22 LAB — HEMOGLOBIN A1C: Hgb A1c MFr Bld: 5.3 % (ref 4.6–6.5)

## 2023-05-22 NOTE — Assessment & Plan Note (Signed)
Will check A1c today. Encouraged healthy diet and exercise.  

## 2023-05-22 NOTE — Assessment & Plan Note (Signed)
Will check hepatic function today. Follow up with Laurette Schimke in July as scheduled.

## 2023-05-22 NOTE — Assessment & Plan Note (Signed)
Chronic. Stable on Norvasc 5 mg daily and HCTZ 12.5 mg PRN. Continue.

## 2023-05-22 NOTE — Assessment & Plan Note (Signed)
Continues to have drainage and allergy symptoms. No longer taking Zyrtec daily. Advised to try different antihistamine daily such as Allegra or Xyzal. She has a nasal spray at home she uses daily, she can continue this. Encouraged adequate fluid intake. Discussed referral to ENT/Allergist if symptoms persist.

## 2023-05-22 NOTE — Assessment & Plan Note (Signed)
Physical exam complete. Lab work as outlined. Will contact patient with results. Pap- today! Colonoscopy- UTD. Mammogram- scheduled for 05/30/2023, encouraged patient to complete. Flu vaccine not due. Tetanus vaccine- UTD. Declined additional COVID vaccines. HIV screening negative. Hep C screening deferred today. Recommended follow up with Dentist and establishing with Ophthalmology for annual exams. Encouraged healthy diet and to begin exercising. Return to care in 6 months, sooner PRN.

## 2023-05-23 LAB — TSH: TSH: 2.64 u[IU]/mL (ref 0.35–5.50)

## 2023-05-23 LAB — VITAMIN D 25 HYDROXY (VIT D DEFICIENCY, FRACTURES): VITD: 29.02 ng/mL — ABNORMAL LOW (ref 30.00–100.00)

## 2023-05-24 LAB — CYTOLOGY - PAP
Chlamydia: NEGATIVE
Comment: NEGATIVE
Comment: NEGATIVE
Comment: NEGATIVE
Comment: NORMAL
Diagnosis: NEGATIVE
High risk HPV: NEGATIVE
Neisseria Gonorrhea: NEGATIVE
Trichomonas: NEGATIVE

## 2023-05-30 ENCOUNTER — Ambulatory Visit
Admission: RE | Admit: 2023-05-30 | Discharge: 2023-05-30 | Disposition: A | Discharge status: Home / Self Care | Payer: BC Managed Care – PPO | Source: Ambulatory Visit | Attending: Nurse Practitioner | Admitting: Nurse Practitioner

## 2023-05-30 DIAGNOSIS — Z1231 Encounter for screening mammogram for malignant neoplasm of breast: Secondary | ICD-10-CM | POA: Diagnosis not present

## 2023-06-19 ENCOUNTER — Other Ambulatory Visit: Payer: Self-pay | Admitting: Gastroenterology

## 2023-06-19 DIAGNOSIS — B181 Chronic viral hepatitis B without delta-agent: Secondary | ICD-10-CM | POA: Diagnosis not present

## 2023-06-19 DIAGNOSIS — K219 Gastro-esophageal reflux disease without esophagitis: Secondary | ICD-10-CM | POA: Diagnosis not present

## 2023-06-20 DIAGNOSIS — I10 Essential (primary) hypertension: Secondary | ICD-10-CM | POA: Diagnosis not present

## 2023-06-20 DIAGNOSIS — H66002 Acute suppurative otitis media without spontaneous rupture of ear drum, left ear: Secondary | ICD-10-CM | POA: Diagnosis not present

## 2023-06-20 DIAGNOSIS — Z6837 Body mass index (BMI) 37.0-37.9, adult: Secondary | ICD-10-CM | POA: Diagnosis not present

## 2023-07-09 IMAGING — US US ABDOMEN COMPLETE
1 series · 14 of 25 positions shown · non-contrast
Comparison: November 26, 2020.

CLINICAL DATA: Chronic viral hepatitis C

EXAM:
ABDOMEN ULTRASOUND COMPLETE

[Series 1: us abdomen complete · 14 of 66 slices shown]
[im 1/66]
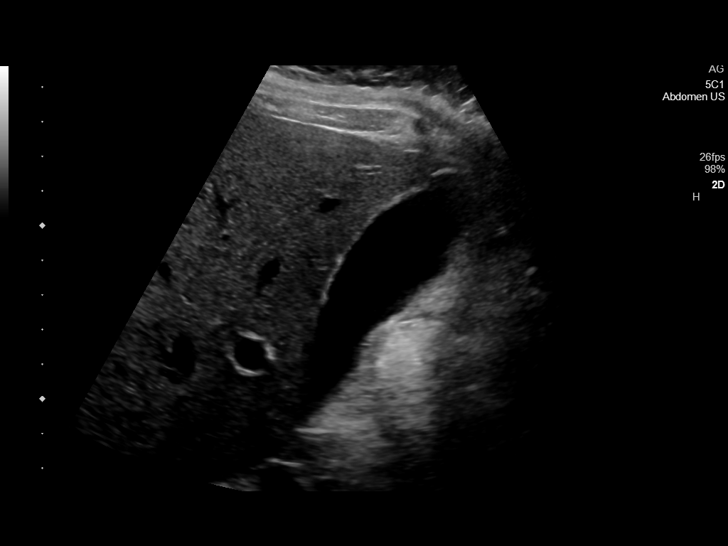
[im 6/66]
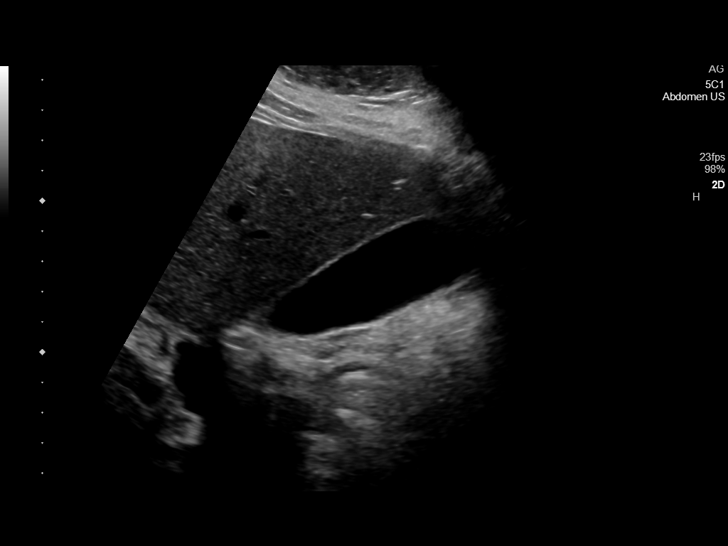
[im 11/66]
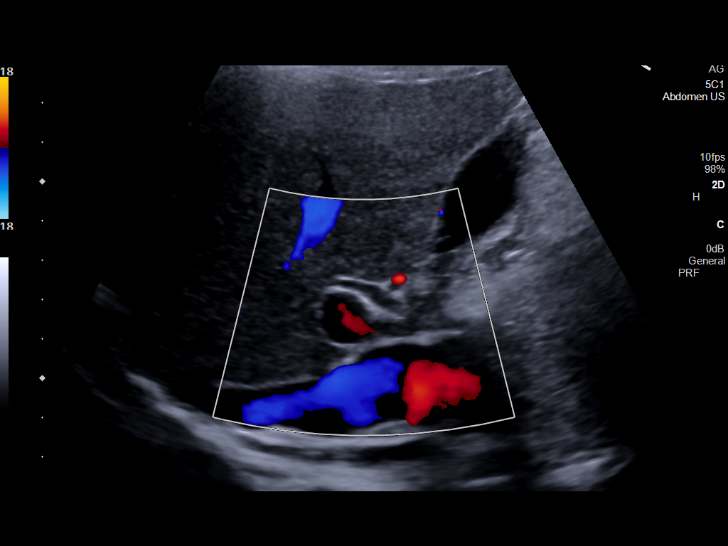
[im 17/66]
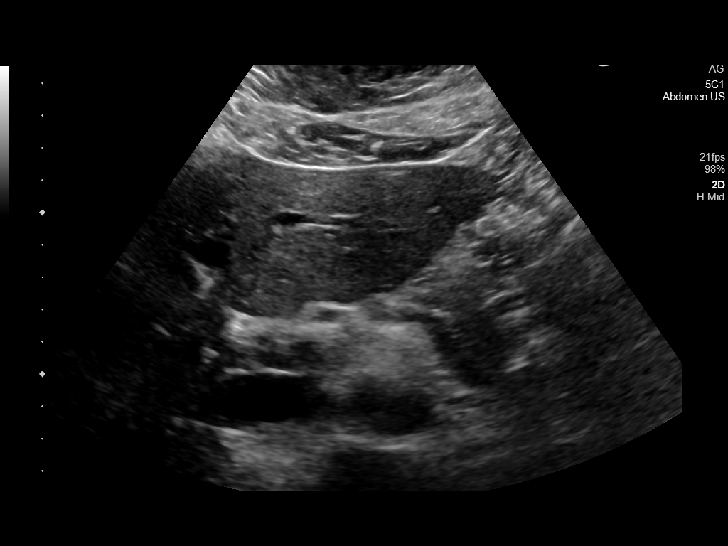
[im 22/66]
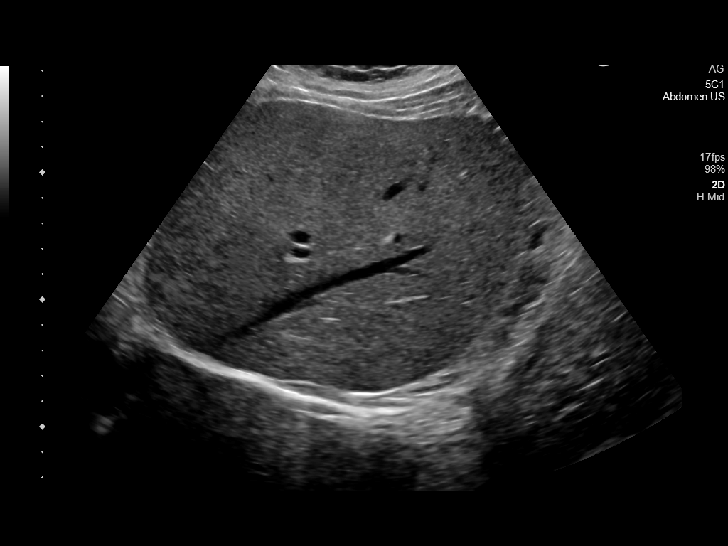
[im 25/66]
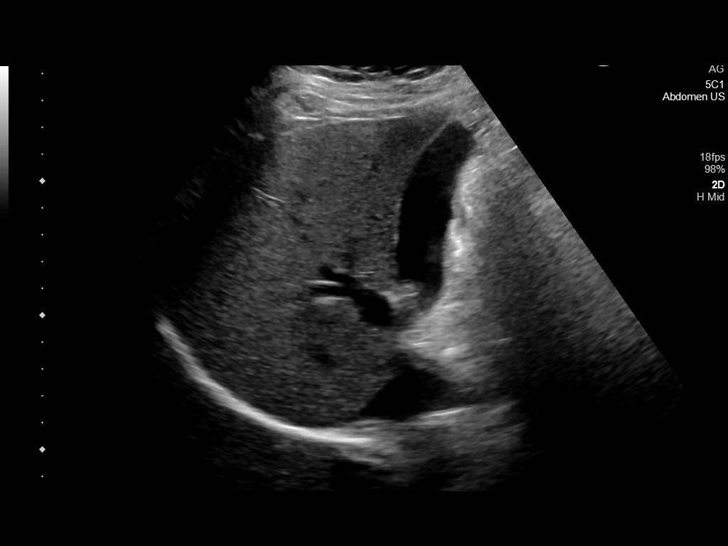
[im 30/66]
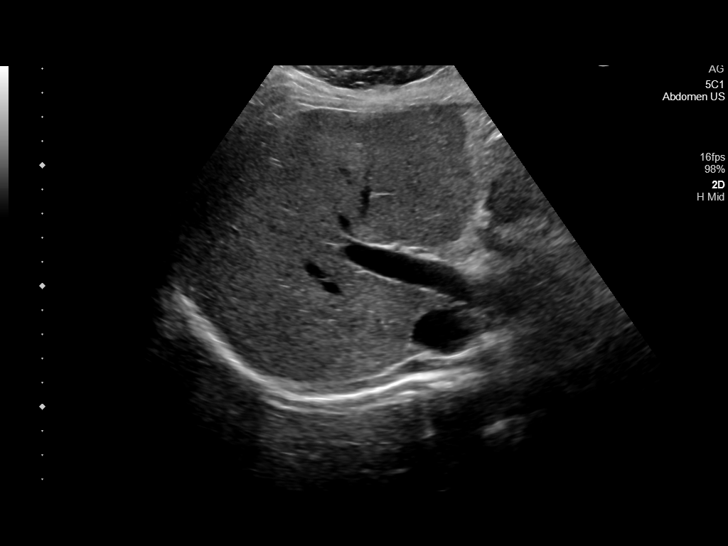
[im 36/66]
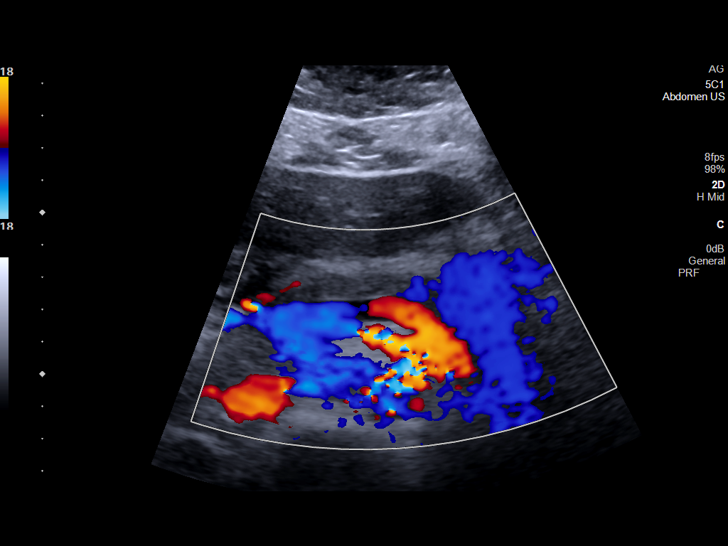
[im 41/66]
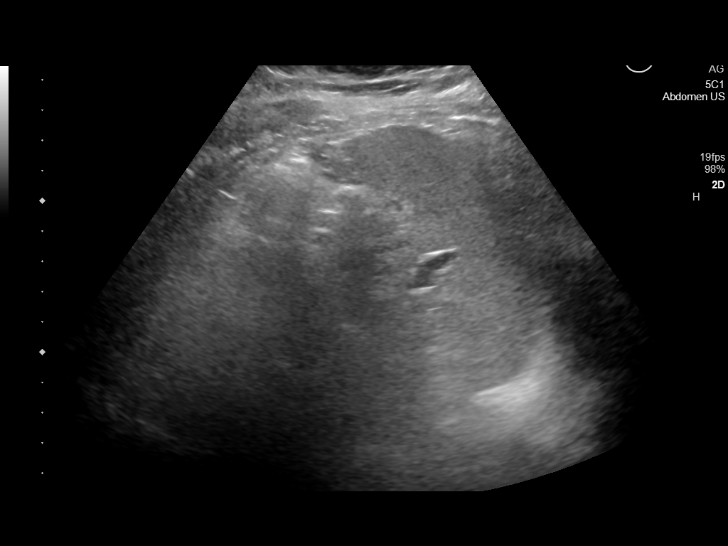
[im 44/66]
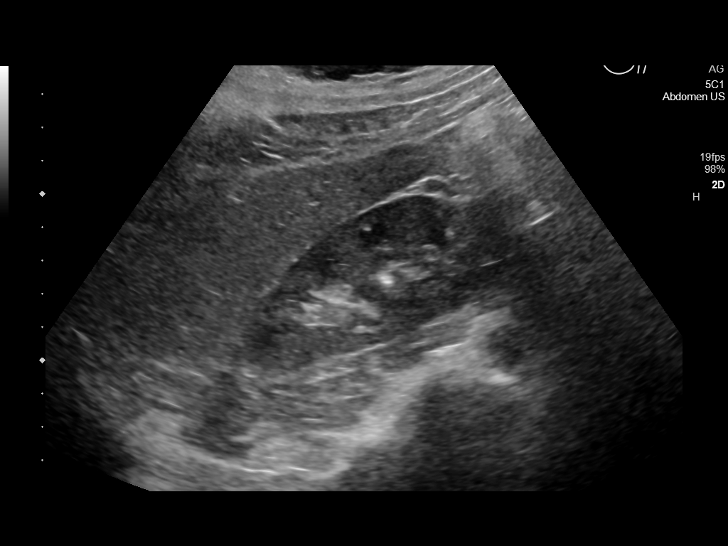
[im 49/66]
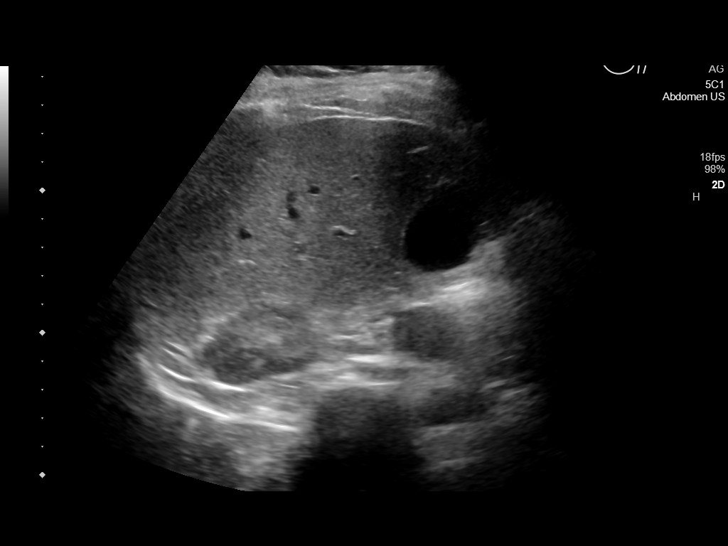
[im 55/66]
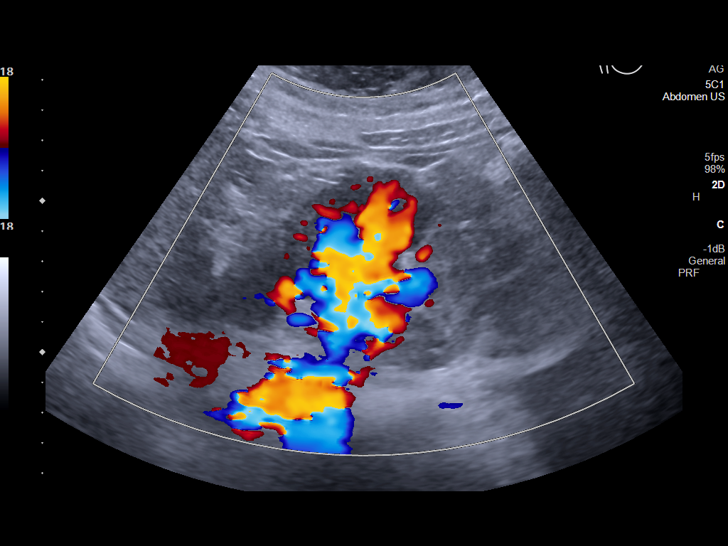
[im 60/66]
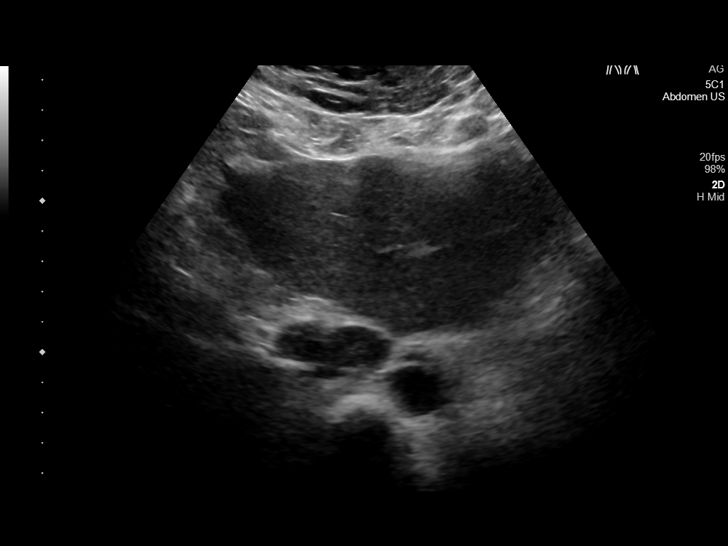
[im 66/66]
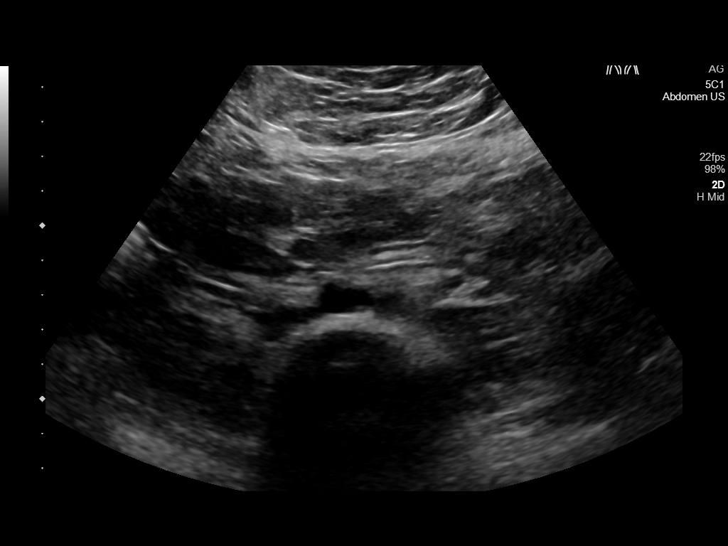

[14 of 25 positions shown; findings below may reference images not displayed]

FINDINGS: Gallbladder: No gallstones or wall thickening visualized. No
sonographic Murphy sign noted by sonographer.

Common bile duct: Diameter: 3 mm

Liver: No focal lesion identified. Within normal limits in
parenchymal echogenicity. Portal vein is patent on color Doppler
imaging with normal direction of blood flow towards the liver.

IVC: No abnormality visualized.

Pancreas: Visualized portion unremarkable.

Spleen: Size and appearance within normal limits.

Right Kidney: Length: 9.5 cm. Echogenicity within normal limits. No
mass or hydronephrosis visualized.

Left Kidney: Length: 9.8 cm. Echogenicity within normal limits. No
mass or hydronephrosis visualized.

Abdominal aorta: No aneurysm visualized.

Other findings: None.
IMPRESSION: Unremarkable abdominal ultrasound.

## 2023-11-21 ENCOUNTER — Ambulatory Visit: Payer: BC Managed Care – PPO | Admitting: Nurse Practitioner

## 2023-11-30 ENCOUNTER — Encounter: Payer: Self-pay | Admitting: Nurse Practitioner

## 2023-11-30 ENCOUNTER — Ambulatory Visit: Payer: BC Managed Care – PPO | Admitting: Nurse Practitioner

## 2023-11-30 VITALS — BP 126/80 | HR 78 | Temp 98.3°F | Ht 62.0 in | Wt 202.4 lb

## 2023-11-30 DIAGNOSIS — E669 Obesity, unspecified: Secondary | ICD-10-CM

## 2023-11-30 DIAGNOSIS — I10 Essential (primary) hypertension: Secondary | ICD-10-CM | POA: Diagnosis not present

## 2023-11-30 DIAGNOSIS — B181 Chronic viral hepatitis B without delta-agent: Secondary | ICD-10-CM | POA: Diagnosis not present

## 2023-11-30 MED ORDER — WEGOVY 0.5 MG/0.5ML ~~LOC~~ SOAJ
0.5000 mg | SUBCUTANEOUS | 0 refills | Status: DC
Start: 2023-11-30 — End: 2024-03-11

## 2023-11-30 MED ORDER — HYDROCHLOROTHIAZIDE 12.5 MG PO CAPS: 12.5000 mg | ORAL_CAPSULE | ORAL | 3 refills | Status: DC | PRN

## 2023-11-30 MED ORDER — WEGOVY 0.25 MG/0.5ML ~~LOC~~ SOAJ
0.2500 mg | SUBCUTANEOUS | 0 refills | Status: DC
Start: 2023-11-30 — End: 2024-01-11

## 2023-11-30 MED ORDER — HYDROCHLOROTHIAZIDE 12.5 MG PO CAPS
12.5000 mg | ORAL_CAPSULE | ORAL | 3 refills | Status: AC | PRN
Start: 2023-11-30 — End: ?

## 2023-11-30 NOTE — Assessment & Plan Note (Signed)
They have struggled with weight loss through diet and exercise but are interested in starting [facility] injections for weight loss. We will start Wegovy injections, beginning with 0.25mg  for 4 weeks, then increasing to 0.5mg  for 4 weeks. They were counseled on the risk of pancreatitis and gallbladder disease. Discussed the risk of nausea. They were advised to discontinue the [facility] and contact us immediately if they develop abdominal pain. If they develop excessive nausea they will contact us right away. I discussed that medullary thyroid cancer has been seen in rats studies. The patient confirmed no personal or family history of thyroid cancer, parathyroid cancer, or adrenal gland cancer. Discussed that we thus far have not seen medullary thyroid cancer result from use of this type of medication in humans. They were advised to monitor the thyroid area and contact us for any lumps, swelling, trouble swallowing, or any other changes in this area. Discussed goal weight loss of 1 to 2 pounds a week while on this medication. Discussed the importance of healthy diet, exercise and lifestyle modifications even with this medication. A check-in appointment is scheduled in 6 weeks to assess tolerance and effectiveness.

## 2023-11-30 NOTE — Progress Notes (Signed)
Bethanie Dicker, NP-C Phone: [phone]  Laura Espinoza is a 47 y.o. female who presents today for follow up.   Discussed the use of AI scribe software for clinical note transcription with the patient, who gave verbal consent to proceed.  History of Present Illness   The patient, with a history of hypertension and Hepatitis B, presents with a primary concern of weight management. They express interest in weight loss injections, having observed positive results in their spouse who has diabetes. They report previous attempts at weight loss through dieting and Weight Watchers, but have found these methods increasingly ineffective, particularly after childbirth. Their weight fluctuates, with a pattern of losing a few pounds and then regaining them.  The patient's hypertension is managed with Norvasc 5 and occasional use of hydrochlorothiazide. They monitor their blood pressure at home, but not daily, and only take the hydrochlorothiazide when they experience symptoms such as noise in their ears and headaches, and their blood pressure readings exceed 160. They deny experiencing chest pain, shortness of breath, dizziness, or swelling.  The patient is also under the care of a gastroenterologist for their Hepatitis B, with a follow-up appointment scheduled for January. They have undergone liver ultrasound due to a diagnosis of fatty liver, and have been advised to lose weight to manage this condition. They report joint pain, particularly in the knees and shoulders, which they believe may be weight-related.  The patient is interested in starting [facility] for weight loss, and is aware of potential side effects such as nausea, constipation, and diarrhea. They express concern about the impact of the medication on their liver, given their existing liver condition. They are aware that the medication will need to be increased gradually, and that they will need to monitor for any adverse effects.      Social History    Tobacco Use  Smoking Status Never  Smokeless Tobacco Never    Current Outpatient Medications on File Prior to Visit  Medication Sig Dispense Refill   amLODipine (NORVASC) 5 MG tablet Take 1 tablet (5 mg total) by mouth daily. 90 tablet 3   etonogestrel (NEXPLANON) 68 MG IMPL implant 1 each by Subdermal route once.     fluticasone-salmeterol (ADVAIR) 100-50 MCG/ACT AEPB Inhale 1 puff into the lungs 2 (two) times daily. 60 each 11   metroNIDAZOLE (METROGEL) 1 % gel SMARTSIG:Sparingly Topical Daily     omeprazole (PRILOSEC) 20 MG capsule Take 20 mg by mouth daily.     albuterol (VENTOLIN HFA) 108 (90 Base) MCG/ACT inhaler Inhale 2 puffs into the lungs every 6 (six) hours as needed for wheezing or shortness of breath. (Patient not taking: Reported on 11/30/2023) 8 g 2   No current facility-administered medications on file prior to visit.    ROS see history of present illness  Objective  Physical Exam Vitals:   11/30/23 0846  BP: 126/80  Pulse: 78  Temp: 98.3 F (36.8 C)  SpO2: 97%    BP Readings from Last 3 Encounters:  11/30/23 126/80  05/22/23 128/70  04/19/23 124/70   Wt Readings from Last 3 Encounters:  11/30/23 202 lb 6.4 oz (91.8 kg)  05/22/23 200 lb (90.7 kg)  04/19/23 201 lb (91.2 kg)    Physical Exam Constitutional:      General: She is not in acute distress.    Appearance: Normal appearance. She is obese.  HENT:     Head: Normocephalic.  Cardiovascular:     Rate and Rhythm: Normal rate and regular rhythm.  Heart sounds: Normal heart sounds.  Pulmonary:     Effort: Pulmonary effort is normal.     Breath sounds: Normal breath sounds.  Skin:    General: Skin is warm and dry.  Neurological:     General: No focal deficit present.     Mental Status: She is alert.  Psychiatric:        Mood and Affect: Mood normal.        Behavior: Behavior normal.    Assessment/Plan: Please see individual problem list.  Obesity (BMI 30-39.9) Assessment &  Plan: They have struggled with weight loss through diet and exercise but are interested in starting [facility] injections for weight loss. We will start Wegovy injections, beginning with 0.25mg  for 4 weeks, then increasing to 0.5mg  for 4 weeks. They were counseled on the risk of pancreatitis and gallbladder disease. Discussed the risk of nausea. They were advised to discontinue the [facility] and contact us immediately if they develop abdominal pain. If they develop excessive nausea they will contact us right away. I discussed that medullary thyroid cancer has been seen in rats studies. The patient confirmed no personal or family history of thyroid cancer, parathyroid cancer, or adrenal gland cancer. Discussed that we thus far have not seen medullary thyroid cancer result from use of this type of medication in humans. They were advised to monitor the thyroid area and contact us for any lumps, swelling, trouble swallowing, or any other changes in this area. Discussed goal weight loss of 1 to 2 pounds a week while on this medication. Discussed the importance of healthy diet, exercise and lifestyle modifications even with this medication. A check-in appointment is scheduled in 6 weeks to assess tolerance and effectiveness.  Orders: -     Wegovy; Inject 0.25 mg into the skin once a week. X 4 weeks then increase to 0.5 mg  Dispense: 2 mL; Refill: 0 -     ZOXWRU; Inject 0.5 mg into the skin once a week. X 4 weeks  Dispense: 2 mL; Refill: 0  Primary hypertension Assessment & Plan: Well controlled on Norvasc 5mg  daily, they self-adjust hydrochlorothiazide based on symptoms and home blood pressure readings, with no symptoms of hypotension or hypertensive urgency reported. We will continue the current regimen and refill Norvasc 5mg  and hydrochlorothiazide 12.5mg  as needed.  Orders: -     hydroCHLOROthiazide; Take 1 capsule (12.5 mg total) by mouth as needed (for elevated blood pressure).  Dispense: 90 capsule; Refill:  3  Hepatitis B carrier [facility]) Assessment & Plan: Managed by gastroenterology, they were last seen in July with a follow-up planned in January. A liver ultrasound was performed for surveillance, with no changes to the current management plan.    Return in about 6 weeks (around 01/11/2024) for Follow up.   Bethanie Dicker, NP-C [facility]

## 2023-11-30 NOTE — Assessment & Plan Note (Signed)
Managed by gastroenterology, they were last seen in July with a follow-up planned in January. A liver ultrasound was performed for surveillance, with no changes to the current management plan.

## 2023-11-30 NOTE — Assessment & Plan Note (Signed)
Well controlled on Norvasc 5mg  daily, they self-adjust hydrochlorothiazide based on symptoms and home blood pressure readings, with no symptoms of hypotension or hypertensive urgency reported. We will continue the current regimen and refill Norvasc 5mg  and hydrochlorothiazide 12.5mg  as needed.

## 2023-12-06 ENCOUNTER — Other Ambulatory Visit (HOSPITAL_COMMUNITY): Payer: Self-pay

## 2023-12-06 ENCOUNTER — Telehealth: Payer: Self-pay | Admitting: Pharmacy Technician

## 2023-12-06 DIAGNOSIS — J189 Pneumonia, unspecified organism: Secondary | ICD-10-CM | POA: Diagnosis not present

## 2023-12-06 DIAGNOSIS — Z6837 Body mass index (BMI) 37.0-37.9, adult: Secondary | ICD-10-CM | POA: Diagnosis not present

## 2023-12-06 NOTE — Telephone Encounter (Signed)
Pharmacy Patient Advocate Encounter   Received notification from CoverMyMeds that prior authorization for Wegovy 0.25MG /0.5ML is required/requested.   Insurance verification completed.   The patient is insured through [employer] .    Received notification from [employer] that Prior Authorization for Wegovy 0.25MG /0.5ML has been APPROVED from 12/06/23 to 07/03/24. Ran test claim, Copay is $24.99. This test claim was processed through [facility]- copay amounts may vary at other pharmacies due to pharmacy/plan contracts, or as the patient moves through the different stages of their insurance plan.   PA #/Case ID/Reference #: (Key: [id number])

## 2023-12-18 DIAGNOSIS — Z6837 Body mass index (BMI) 37.0-37.9, adult: Secondary | ICD-10-CM | POA: Diagnosis not present

## 2023-12-18 DIAGNOSIS — J4520 Mild intermittent asthma, uncomplicated: Secondary | ICD-10-CM | POA: Diagnosis not present

## 2023-12-18 DIAGNOSIS — R051 Acute cough: Secondary | ICD-10-CM | POA: Diagnosis not present

## 2023-12-23 ENCOUNTER — Encounter: Payer: Self-pay | Admitting: Nurse Practitioner

## 2024-01-11 ENCOUNTER — Encounter: Payer: Self-pay | Admitting: Nurse Practitioner

## 2024-01-11 ENCOUNTER — Ambulatory Visit: Payer: BC Managed Care – PPO | Admitting: Nurse Practitioner

## 2024-01-11 VITALS — BP 120/76 | HR 71 | Temp 98.1°F | Ht 62.0 in | Wt 197.8 lb

## 2024-01-11 DIAGNOSIS — E669 Obesity, unspecified: Secondary | ICD-10-CM

## 2024-01-11 DIAGNOSIS — R110 Nausea: Secondary | ICD-10-CM | POA: Diagnosis not present

## 2024-01-11 DIAGNOSIS — Z6836 Body mass index (BMI) 36.0-36.9, adult: Secondary | ICD-10-CM

## 2024-01-11 MED ORDER — WEGOVY 1 MG/0.5ML ~~LOC~~ SOAJ
1.0000 mg | SUBCUTANEOUS | 0 refills | Status: DC
Start: 2024-01-11 — End: 2024-03-11

## 2024-01-11 MED ORDER — ONDANSETRON 4 MG PO TBDP
4.0000 mg | ORAL_TABLET | Freq: Three times a day (TID) | ORAL | 0 refills | Status: DC | PRN
Start: 2024-01-11 — End: 2024-03-11

## 2024-01-11 NOTE — Progress Notes (Signed)
Bethanie Dicker, NP-C Phone: [phone]  KATHARYN SCHAUER is a 48 y.o. female who presents today for follow up.   Discussed the use of AI scribe software for clinical note transcription with the patient, who gave verbal consent to proceed.  History of Present Illness   The patient presents for a six-week follow-up after starting [facility] for weight loss.  She began [facility] treatment two weeks late due to an illness with pneumonia that lasted a couple of weeks. She is currently on the 0.25 mg dose and will complete the last dose tomorrow before increasing to 0.5 mg, which she already has in her refrigerator. She has lost five pounds, attributing some of this to being sick, estimating a two-pound loss from her own scale. She feels lighter and has noticed a change in her appetite.  She experiences significant nausea, particularly during the first two days after the injection, describing it as similar to 'morning sickness.' The nausea subsides after the initial days unless she consumes greasy food or overeats. She has not experienced vomiting. The nausea was less severe with the second injection and improved further with the third. She has not experienced abdominal pain, although she felt bloated after the first shot, which resolved by the second week. No other significant side effects aside from nausea and bloating.  She is considering a goal weight of 150 pounds, recalling that she was around 55 kilograms in her twenties. She is currently 197 pounds and aims to reach a weight that would classify her as non-obese.      Social History   Tobacco Use  Smoking Status Never  Smokeless Tobacco Never    Current Outpatient Medications on File Prior to Visit  Medication Sig Dispense Refill   albuterol (VENTOLIN HFA) 108 (90 Base) MCG/ACT inhaler Inhale 2 puffs into the lungs every 6 (six) hours as needed for wheezing or shortness of breath. (Patient not taking: Reported on 11/30/2023) 8 g 2   amLODipine  (NORVASC) 5 MG tablet Take 1 tablet (5 mg total) by mouth daily. 90 tablet 3   etonogestrel (NEXPLANON) 68 MG IMPL implant 1 each by Subdermal route once.     fluticasone-salmeterol (ADVAIR) 100-50 MCG/ACT AEPB Inhale 1 puff into the lungs 2 (two) times daily. 60 each 11   hydrochlorothiazide (MICROZIDE) 12.5 MG capsule Take 1 capsule (12.5 mg total) by mouth as needed (for elevated blood pressure). 90 capsule 3   metroNIDAZOLE (METROGEL) 1 % gel SMARTSIG:Sparingly Topical Daily     omeprazole (PRILOSEC) 20 MG capsule Take 20 mg by mouth daily.     Semaglutide-Weight Management (WEGOVY) 0.5 MG/0.5ML SOAJ Inject 0.5 mg into the skin once a week. X 4 weeks 2 mL 0   No current facility-administered medications on file prior to visit.     ROS see history of present illness  Objective  Physical Exam Vitals:   01/11/24 0819  BP: 120/76  Pulse: 71  Temp: 98.1 F (36.7 C)  SpO2: 98%    BP Readings from Last 3 Encounters:  01/11/24 120/76  11/30/23 126/80  05/22/23 128/70   Wt Readings from Last 3 Encounters:  01/11/24 197 lb 12.8 oz (89.7 kg)  11/30/23 202 lb 6.4 oz (91.8 kg)  05/22/23 200 lb (90.7 kg)    Physical Exam Constitutional:      General: She is not in acute distress.    Appearance: Normal appearance.  HENT:     Head: Normocephalic.  Cardiovascular:     Rate and Rhythm: Normal  rate and regular rhythm.     Heart sounds: Normal heart sounds.  Pulmonary:     Effort: Pulmonary effort is normal.     Breath sounds: Normal breath sounds.  Skin:    General: Skin is warm and dry.  Neurological:     General: No focal deficit present.     Mental Status: She is alert.  Psychiatric:        Mood and Affect: Mood normal.        Behavior: Behavior normal.    Assessment/Plan: Please see individual problem list.  Nausea Assessment & Plan: Transient nausea is reported after [facility] administration, particularly severe in the first two days post-injection. Zofran is  prescribed for use as needed. If nausea persists despite Zofran, discontinuation of Wegovy will be considered.  Orders: -     Ondansetron; Take 1 tablet (4 mg total) by mouth every 8 (eight) hours as needed for nausea or vomiting.  Dispense: 30 tablet; Refill: 0  Obesity (BMI 30-39.9) Assessment & Plan: Reginal Lutes was initiated for weight loss, with a current dose of 0.25mg  being well-tolerated, though mild transient nausea is present. Appetite has decreased, and there is a weight loss of approximately 2-5lbs. The plan is to increase Wegovy to 0.5mg  after completing the 0.25mg  course. If tolerable, the dose will continue to increase every 4 weeks as per protocol, up to a maximum of 2.4mg . If nausea becomes unmanageable, the current dose may be maintained for an additional month before attempting an increase. A goal weight of 150lbs is considered. Follow-up is planned in 2 months to assess response and tolerance to the medication.  Orders: -     Wegovy; Inject 1 mg into the skin once a week.  Dispense: 2 mL; Refill: 0  BMI 36.0-36.9,adult -     ZOXWRU; Inject 1 mg into the skin once a week.  Dispense: 2 mL; Refill: 0   Return in about 2 months (around 03/10/2024) for Follow up.   Bethanie Dicker, NP-C [facility]

## 2024-01-11 NOTE — Assessment & Plan Note (Signed)
Transient nausea is reported after [facility] administration, particularly severe in the first two days post-injection. Zofran is prescribed for use as needed. If nausea persists despite Zofran, discontinuation of Wegovy will be considered.

## 2024-01-11 NOTE — Assessment & Plan Note (Signed)
Laura Espinoza was initiated for weight loss, with a current dose of 0.25mg  being well-tolerated, though mild transient nausea is present. Appetite has decreased, and there is a weight loss of approximately 2-5lbs. The plan is to increase Wegovy to 0.5mg  after completing the 0.25mg  course. If tolerable, the dose will continue to increase every 4 weeks as per protocol, up to a maximum of 2.4mg . If nausea becomes unmanageable, the current dose may be maintained for an additional month before attempting an increase. A goal weight of 150lbs is considered. Follow-up is planned in 2 months to assess response and tolerance to the medication.

## 2024-01-16 ENCOUNTER — Other Ambulatory Visit (HOSPITAL_COMMUNITY): Payer: Self-pay

## 2024-01-17 ENCOUNTER — Other Ambulatory Visit: Payer: Self-pay

## 2024-01-17 DIAGNOSIS — J4520 Mild intermittent asthma, uncomplicated: Secondary | ICD-10-CM

## 2024-01-18 ENCOUNTER — Other Ambulatory Visit (HOSPITAL_COMMUNITY): Payer: Self-pay

## 2024-01-18 ENCOUNTER — Telehealth: Payer: Self-pay

## 2024-01-18 MED ORDER — FLUTICASONE-SALMETEROL 100-50 MCG/ACT IN AEPB: 1.0000 | INHALATION_SPRAY | Freq: Two times a day (BID) | RESPIRATORY_TRACT | 11 refills | Status: AC

## 2024-01-18 NOTE — Telephone Encounter (Signed)
 Pharmacy Patient Advocate Encounter   Received notification from CoverMyMeds that prior authorization for Fluticasone -Salmeterol 100-50MCG/ACT aerosol powder  is required/requested.   Insurance verification completed.   The patient is insured through [employer] .   Per test claim: Refill too soon. PA is not needed at this time. Medication was filled 01/18/2024. Next eligible fill date is 02/09/2024.

## 2024-03-05 ENCOUNTER — Other Ambulatory Visit: Payer: Self-pay | Admitting: Nurse Practitioner

## 2024-03-05 DIAGNOSIS — I10 Essential (primary) hypertension: Secondary | ICD-10-CM

## 2024-03-11 ENCOUNTER — Ambulatory Visit: Payer: BC Managed Care – PPO | Admitting: Nurse Practitioner

## 2024-03-11 ENCOUNTER — Encounter: Payer: Self-pay | Admitting: Nurse Practitioner

## 2024-03-11 VITALS — BP 128/72 | HR 72 | Temp 98.0°F | Ht 62.0 in | Wt 193.6 lb

## 2024-03-11 DIAGNOSIS — R110 Nausea: Secondary | ICD-10-CM | POA: Diagnosis not present

## 2024-03-11 DIAGNOSIS — I10 Essential (primary) hypertension: Secondary | ICD-10-CM

## 2024-03-11 DIAGNOSIS — Z6835 Body mass index (BMI) 35.0-35.9, adult: Secondary | ICD-10-CM | POA: Diagnosis not present

## 2024-03-11 DIAGNOSIS — E669 Obesity, unspecified: Secondary | ICD-10-CM

## 2024-03-11 MED ORDER — WEGOVY 1 MG/0.5ML ~~LOC~~ SOAJ: 1.0000 mg | SUBCUTANEOUS | 0 refills | Status: DC

## 2024-03-11 MED ORDER — ONDANSETRON 4 MG PO TBDP: 4.0000 mg | ORAL_TABLET | Freq: Three times a day (TID) | ORAL | 0 refills | Status: DC | PRN

## 2024-03-11 MED ORDER — WEGOVY 1.7 MG/0.75ML ~~LOC~~ SOAJ: 1.7000 mg | SUBCUTANEOUS | 2 refills | Status: DC

## 2024-03-11 NOTE — Assessment & Plan Note (Signed)
 She is on Wegovy and has lost about 10 lbs, currently weighing 190-193 lbs. Nausea at the 1 mg dose is managed with Zofran. Her energy has improved, knee pain has decreased, and overall well-being is better. She prefers to stabilize at 1 mg before increasing the dose. Refill Wegovy 1 mg weekly for one month and prescribe Wegovy 1.7 mg with refills. Continue Zofran as needed. Encourage healthy diet and regular exercise.

## 2024-03-11 NOTE — Progress Notes (Signed)
 Bethanie Dicker, NP-C Phone: [phone]  Laura Espinoza is a 48 y.o. female who presents today for weight management follow up.   Discussed the use of AI scribe software for clinical note transcription with the patient, who gave verbal consent to proceed.  History of Present Illness   Laura Espinoza is a 48 year old female who presents for a follow-up on weight management with Wegovy.  She has been on Wegovy, starting at 0.25 mg, then increasing to 0.5 mg, and is currently on 1 mg weekly. She has lost at least 10 pounds since starting the medication, with her current weight around 193 pounds, down from over 200 pounds. Her appetite has decreased, and she feels full faster, eating about half of what she used to. She has adjusted her diet to avoid heavy and greasy foods, which has helped manage her symptoms.  She experienced nausea when increasing the dose to 1 mg, but this was managed with Zofran, which she only needed for a couple of days. The nausea is random and seems to depend on her diet. She has not needed the anti-nausea medication in the past week.  Her blood pressure has improved with weight loss, and she reports increased energy levels and less knee pain, allowing her to move more easily without losing breath. She continues to take Norvasc (amlodipine) daily for blood pressure management but rarely needs hydrochlorothiazide, last taking it in December or January.  No chest pain, shortness of breath, or swelling. She feels better overall with the weight loss and improved blood pressure.      Social History   Tobacco Use  Smoking Status Never  Smokeless Tobacco Never    Current Outpatient Medications on File Prior to Visit  Medication Sig Dispense Refill   albuterol (VENTOLIN HFA) 108 (90 Base) MCG/ACT inhaler Inhale 2 puffs into the lungs every 6 (six) hours as needed for wheezing or shortness of breath. (Patient not taking: Reported on 11/30/2023) 8 g 2   amLODipine (NORVASC) 5 MG  tablet TAKE 1 TABLET(5 MG) BY MOUTH DAILY 90 tablet 3   etonogestrel (NEXPLANON) 68 MG IMPL implant 1 each by Subdermal route once.     fluticasone-salmeterol (ADVAIR) 100-50 MCG/ACT AEPB Inhale 1 puff into the lungs 2 (two) times daily. 60 each 11   hydrochlorothiazide (MICROZIDE) 12.5 MG capsule Take 1 capsule (12.5 mg total) by mouth as needed (for elevated blood pressure). 90 capsule 3   metroNIDAZOLE (METROGEL) 1 % gel SMARTSIG:Sparingly Topical Daily     omeprazole (PRILOSEC) 20 MG capsule Take 20 mg by mouth daily.     No current facility-administered medications on file prior to visit.     ROS see history of present illness  Objective  Physical Exam Vitals:   03/11/24 1321  BP: 128/72  Pulse: 72  Temp: 98 F (36.7 C)  SpO2: 98%    BP Readings from Last 3 Encounters:  03/11/24 128/72  01/11/24 120/76  11/30/23 126/80   Wt Readings from Last 3 Encounters:  03/11/24 193 lb 9.6 oz (87.8 kg)  01/11/24 197 lb 12.8 oz (89.7 kg)  11/30/23 202 lb 6.4 oz (91.8 kg)    Physical Exam Constitutional:      General: She is not in acute distress.    Appearance: Normal appearance.  HENT:     Head: Normocephalic.  Cardiovascular:     Rate and Rhythm: Normal rate and regular rhythm.     Heart sounds: Normal heart sounds.  Pulmonary:  Effort: Pulmonary effort is normal.     Breath sounds: Normal breath sounds.  Skin:    General: Skin is warm and dry.  Neurological:     General: No focal deficit present.     Mental Status: She is alert.  Psychiatric:        Mood and Affect: Mood normal.        Behavior: Behavior normal.     Assessment/Plan: Please see individual problem list.  Obesity (BMI 30-39.9) Assessment & Plan: She is on Wegovy and has lost about 10 lbs, currently weighing 190-193 lbs. Nausea at the 1 mg dose is managed with Zofran. Her energy has improved, knee pain has decreased, and overall well-being is better. She prefers to stabilize at 1 mg before  increasing the dose. Refill Wegovy 1 mg weekly for one month and prescribe Wegovy 1.7 mg with refills. Continue Zofran as needed. Encourage healthy diet and regular exercise.   Orders: -     Wegovy; Inject 1 mg into the skin once a week.  Dispense: 2 mL; Refill: 0 -     Wegovy; Inject 1.7 mg into the skin once a week.  Dispense: 3 mL; Refill: 2  BMI 35.0-35.9,adult -     ZOXWRU; Inject 1 mg into the skin once a week.  Dispense: 2 mL; Refill: 0 -     Wegovy; Inject 1.7 mg into the skin once a week.  Dispense: 3 mL; Refill: 2  Primary hypertension Assessment & Plan: Her hypertension is well-controlled with amlodipine 5 mg daily and occasional hydrochlorothiazide. Blood pressure readings are stable. Continue amlodipine daily and use hydrochlorothiazide as needed.    Nausea -     Ondansetron; Take 1 tablet (4 mg total) by mouth every 8 (eight) hours as needed for nausea or vomiting.  Dispense: 30 tablet; Refill: 0    Return in about 2 months (around 05/21/2024) for Annual Exam.   Bethanie Dicker, NP-C [facility]

## 2024-03-11 NOTE — Assessment & Plan Note (Signed)
 Her hypertension is well-controlled with amlodipine 5 mg daily and occasional hydrochlorothiazide. Blood pressure readings are stable. Continue amlodipine daily and use hydrochlorothiazide as needed.

## 2024-04-22 DIAGNOSIS — K219 Gastro-esophageal reflux disease without esophagitis: Secondary | ICD-10-CM | POA: Diagnosis not present

## 2024-04-22 DIAGNOSIS — B181 Chronic viral hepatitis B without delta-agent: Secondary | ICD-10-CM | POA: Diagnosis not present

## 2024-04-23 ENCOUNTER — Other Ambulatory Visit: Payer: Self-pay | Admitting: Gastroenterology

## 2024-04-23 DIAGNOSIS — B181 Chronic viral hepatitis B without delta-agent: Secondary | ICD-10-CM

## 2024-04-29 ENCOUNTER — Ambulatory Visit
Admission: RE | Admit: 2024-04-29 | Discharge: 2024-04-29 | Disposition: A | Discharge status: Home / Self Care | Source: Ambulatory Visit | Attending: Gastroenterology | Admitting: Gastroenterology

## 2024-04-29 DIAGNOSIS — B181 Chronic viral hepatitis B without delta-agent: Secondary | ICD-10-CM | POA: Insufficient documentation

## 2024-04-29 DIAGNOSIS — R932 Abnormal findings on diagnostic imaging of liver and biliary tract: Secondary | ICD-10-CM | POA: Diagnosis not present

## 2024-05-21 DIAGNOSIS — B181 Chronic viral hepatitis B without delta-agent: Secondary | ICD-10-CM | POA: Diagnosis not present

## 2024-05-23 ENCOUNTER — Encounter: Admitting: Nurse Practitioner

## 2024-06-10 ENCOUNTER — Other Ambulatory Visit: Payer: Self-pay | Admitting: Family

## 2024-06-10 ENCOUNTER — Encounter: Payer: Self-pay | Admitting: Nurse Practitioner

## 2024-06-10 MED ORDER — WEGOVY 2.4 MG/0.75ML ~~LOC~~ SOAJ: 2.4000 mg | SUBCUTANEOUS | 1 refills | Status: DC

## 2024-06-10 MED ORDER — ONDANSETRON HCL 4 MG PO TABS: 4.0000 mg | ORAL_TABLET | Freq: Three times a day (TID) | ORAL | 0 refills | Status: DC | PRN

## 2024-07-22 DIAGNOSIS — D225 Melanocytic nevi of trunk: Secondary | ICD-10-CM | POA: Diagnosis not present

## 2024-07-22 DIAGNOSIS — D2261 Melanocytic nevi of right upper limb, including shoulder: Secondary | ICD-10-CM | POA: Diagnosis not present

## 2024-07-22 DIAGNOSIS — D2262 Melanocytic nevi of left upper limb, including shoulder: Secondary | ICD-10-CM | POA: Diagnosis not present

## 2024-07-22 DIAGNOSIS — D2272 Melanocytic nevi of left lower limb, including hip: Secondary | ICD-10-CM | POA: Diagnosis not present

## 2024-07-23 ENCOUNTER — Ambulatory Visit: Admitting: Nurse Practitioner

## 2024-07-23 VITALS — BP 120/70 | HR 74 | Temp 98.2°F | Ht 62.0 in | Wt 179.4 lb

## 2024-07-23 DIAGNOSIS — Z13220 Encounter for screening for lipoid disorders: Secondary | ICD-10-CM | POA: Diagnosis not present

## 2024-07-23 DIAGNOSIS — Z1231 Encounter for screening mammogram for malignant neoplasm of breast: Secondary | ICD-10-CM

## 2024-07-23 DIAGNOSIS — M85461 Solitary bone cyst, right tibia and fibula: Secondary | ICD-10-CM

## 2024-07-23 DIAGNOSIS — I10 Essential (primary) hypertension: Secondary | ICD-10-CM | POA: Diagnosis not present

## 2024-07-23 DIAGNOSIS — Z0000 Encounter for general adult medical examination without abnormal findings: Secondary | ICD-10-CM

## 2024-07-23 DIAGNOSIS — E669 Obesity, unspecified: Secondary | ICD-10-CM | POA: Diagnosis not present

## 2024-07-23 DIAGNOSIS — Z1329 Encounter for screening for other suspected endocrine disorder: Secondary | ICD-10-CM | POA: Diagnosis not present

## 2024-07-23 NOTE — Progress Notes (Addendum)
 Leron Glance, NP-C Phone: [phone]  Laura Espinoza is a 48 y.o. female who presents today for annual exam.   Discussed the use of AI scribe software for clinical note transcription with the patient, who gave verbal consent to proceed.  History of Present Illness   Laura Espinoza is a 48 year old female who presents for annual exam.   She has lost a total of 23 pounds over the past year. She notes a decrease in nausea and has adjusted her diet to exclude greasy foods, which previously exacerbated her symptoms. She is currently on a 2.4 mg dose of Wegovy , started a month ago, and uses Zofran  as needed for nausea. She reports a decreased appetite and a change in taste preferences, particularly a reduced desire for sweets and alcohol.  She describes a painful knot on her right thigh that developed post-pregnancy, approximately five years ago, which has recently increased in size and frequency of pain. The knot is palpable and sometimes feels like it moves slightly. She has not had any imaging studies done for this issue in the past.  Her diet consists of oatmeal or similar for breakfast, sandwiches for lunch, and a balanced dinner with chicken, rice, and vegetables. She snacks on fruit or yogurt throughout the day. She reports cooking more at home and eating smaller portions more frequently.  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 She is more active during the summer and on weekends.  She reports occasional dizziness, particularly when standing up, and attributes it to low blood pressure or weather changes. She takes amlodipine  daily and hydrochlorothiazide  occasionally when her blood pressure is high. She maintains good hydration, especially when active outdoors.  Her sleep is generally good, but she experiences insomnia if her routine is disrupted. She uses melatonin  occasionally to aid sleep, particularly when traveling. No chest pain, shortness of breath, abdominal pain, constipation, diarrhea, burning with urination, abnormal discharge, painful intercourse, headaches, or skin changes.      Social History   Tobacco Use  Smoking Status Never  Smokeless Tobacco Never    Current Outpatient Medications on File Prior to Visit  Medication Sig Dispense Refill   albuterol  (VENTOLIN  HFA) 108 (90 Base) MCG/ACT inhaler Inhale 2 puffs into the lungs every 6 (six) hours as needed for wheezing or shortness of breath. 8 g 2   amLODipine  (NORVASC ) 5 MG tablet TAKE 1 TABLET(5 MG) BY MOUTH DAILY 90 tablet 3   etonogestrel  (NEXPLANON ) 68 MG IMPL implant 1 each by Subdermal route once.     fluticasone -salmeterol (ADVAIR) 100-50 MCG/ACT AEPB Inhale 1 puff into the lungs 2 (two) times daily. 60 each 11   hydrochlorothiazide  (MICROZIDE ) 12.5 MG capsule Take 1 capsule (12.5 mg total) by mouth as needed (for elevated blood pressure). 90 capsule 3   metroNIDAZOLE (METROGEL) 1 % gel SMARTSIG:Sparingly Topical Daily     omeprazole (PRILOSEC) 20 MG capsule Take 20 mg by mouth daily.     ondansetron  (ZOFRAN ) 4 MG tablet Take 1 tablet (4 mg total) by mouth every 8 (eight) hours as needed for nausea or vomiting. 20 tablet 0   ondansetron  (ZOFRAN -ODT) 4 MG disintegrating tablet Take 1 tablet (4 mg total) by mouth every 8 (eight) hours as needed for nausea or vomiting. 30 tablet 0   Semaglutide -Weight Management (WEGOVY ) 2.4 MG/0.75ML SOAJ Inject 2.4 mg into the skin once a week. 3 mL  1   No current facility-administered medications on file prior to visit.     ROS see history of present illness  Objective  Physical Exam Vitals:   07/23/24 1343  BP: 120/70  Pulse: 74  Temp: 98.2 F (36.8 C)  SpO2: 98%    BP Readings from Last 3 Encounters:  07/23/24 120/70  03/11/24 128/72  01/11/24 120/76   Wt Readings from Last 3 Encounters:  07/23/24 179 lb 6.4 oz (81.4 kg)   03/11/24 193 lb 9.6 oz (87.8 kg)  01/11/24 197 lb 12.8 oz (89.7 kg)    Physical Exam Constitutional:      General: She is not in acute distress.    Appearance: Normal appearance.  HENT:     Head: Normocephalic.     Right Ear: Tympanic membrane normal.     Left Ear: Tympanic membrane normal.     Nose: Nose normal.     Mouth/Throat:     Mouth: Mucous membranes are moist.     Pharynx: Oropharynx is clear.  Eyes:     Conjunctiva/sclera: Conjunctivae normal.     Pupils: Pupils are equal, round, and reactive to light.  Neck:     Thyroid : No thyromegaly.  Cardiovascular:     Rate and Rhythm: Normal rate and regular rhythm.     Heart sounds: Normal heart sounds.  Pulmonary:     Effort: Pulmonary effort is normal.     Breath sounds: Normal breath sounds.  Abdominal:     General: Abdomen is flat. Bowel sounds are normal.     Palpations: Abdomen is soft. There is no mass.     Tenderness: There is no abdominal tenderness.  Musculoskeletal:        General: Normal range of motion.     Right upper leg: Deformity and tenderness present.       Legs:     Comments: Solid, moveable mass present. Mild tenderness on palpation.   Lymphadenopathy:     Cervical: No cervical adenopathy.  Skin:    General: Skin is warm and dry.     Findings: No rash.  Neurological:     General: No focal deficit present.     Mental Status: She is alert.  Psychiatric:        Mood and Affect: Mood normal.        Behavior: Behavior normal.     Assessment/Plan: Please see individual problem list.  Preventative health care Assessment & Plan: Physical exam complete. We will check lab work as outlined. Pap smear and colonoscopy are up to date. Mammogram is due. Declines flu and additional COVID vaccines. Tetanus vaccine is up to date. Continue routine dental and eye exams. Schedule mammogram, order placed. Encourage continued healthy diet and regular exercise. Return to care in 3 months, sooner as needed.    Orders: -     VITAMIN D  25 Hydroxy (Vit-D Deficiency, Fractures)  Solitary bone cyst of lower leg, right Assessment & Plan: The mass has been present for five years, with recent increases in size and pain. Differential diagnosis includes a cyst, requiring further evaluation. No prior imaging has been done. Order an ultrasound of the right lower extremity to evaluate the mass. Consider referral to general surgery for potential removal based on ultrasound results.  Orders: -     US  SOFT TISSUE LOWER EXTREMITY LIMITED RIGHT (NON-VASCULAR); Future  Obesity (BMI 30-39.9) Assessment & Plan: She has managed a 23-pound weight loss with ongoing treatment. Wegovy  2.4 mg effectively reduces appetite  and weight, with decreased nausea and altered taste preferences. Continue Wegovy  2.4 mg weekly and refill the prescription. Encourage increased physical activity, including walking and muscle-building exercises.   Primary hypertension Assessment & Plan: Her hypertension is well-controlled with amlodipine  5 mg daily and occasional hydrochlorothiazide . Blood pressure readings are stable. Monitor for increased dizziness. Encourage slow transitions. Continue amlodipine  daily and use hydrochlorothiazide  as needed.   Orders: -     Basic metabolic panel with GFR  Lipid screening -     Lipid panel  Thyroid  disorder screen -     TSH  Screening mammogram for breast cancer -     3D Screening Mammogram, Left and Right; Future      Return in about 3 months (around 10/23/2024) for Follow up.   Leron Glance, NP-C [facility]

## 2024-07-24 ENCOUNTER — Ambulatory Visit: Payer: Self-pay | Admitting: Nurse Practitioner

## 2024-07-24 LAB — BASIC METABOLIC PANEL WITH GFR
BUN: 8 mg/dL (ref 6–23)
CO2: 25 meq/L (ref 19–32)
Calcium: 9.2 mg/dL (ref 8.4–10.5)
Chloride: 104 meq/L (ref 96–112)
Creatinine, Ser: 0.62 mg/dL (ref 0.40–1.20)
GFR: 106.51 mL/min (ref >60.00)
Glucose, Bld: 91 mg/dL (ref 70–99)
Potassium: 4.2 meq/L (ref 3.5–5.1)
Sodium: 137 meq/L (ref 135–145)

## 2024-07-24 LAB — LIPID PANEL
Cholesterol: 173 mg/dL (ref 0–200)
HDL: 49.2 mg/dL (ref >39.00)
LDL Cholesterol: 80 mg/dL (ref 0–99)
NonHDL: 123.34
Total CHOL/HDL Ratio: 4
Triglycerides: 217 mg/dL — ABNORMAL HIGH (ref 0.0–149.0)
VLDL: 43.4 mg/dL — ABNORMAL HIGH (ref 0.0–40.0)

## 2024-07-24 LAB — TSH: TSH: 1.95 u[IU]/mL (ref 0.35–5.50)

## 2024-07-24 LAB — VITAMIN D 25 HYDROXY (VIT D DEFICIENCY, FRACTURES): VITD: 38.72 ng/mL (ref 30.00–100.00)

## 2024-07-25 ENCOUNTER — Telehealth: Payer: Self-pay

## 2024-07-25 ENCOUNTER — Other Ambulatory Visit (HOSPITAL_COMMUNITY): Payer: Self-pay

## 2024-07-25 NOTE — Telephone Encounter (Signed)
 Pharmacy Patient Advocate Encounter  Received notification from [facility] that Prior Authorization for Wegovy  2.4mg /0.59ml has been APPROVED from 06/25/24 to 07/25/25   PA #/Case ID/Reference #: [id number]

## 2024-08-04 ENCOUNTER — Encounter: Payer: Self-pay | Admitting: Nurse Practitioner

## 2024-08-04 NOTE — Assessment & Plan Note (Signed)
 She has managed a 23-pound weight loss with ongoing treatment. Wegovy  2.4 mg effectively reduces appetite and weight, with decreased nausea and altered taste preferences. Continue Wegovy  2.4 mg weekly and refill the prescription. Encourage increased physical activity, including walking and muscle-building exercises.

## 2024-08-04 NOTE — Assessment & Plan Note (Signed)
 The mass has been present for five years, with recent increases in size and pain. Differential diagnosis includes a cyst, requiring further evaluation. No prior imaging has been done. Order an ultrasound of the right lower extremity to evaluate the mass. Consider referral to general surgery for potential removal based on ultrasound results.

## 2024-08-04 NOTE — Assessment & Plan Note (Signed)
 Physical exam complete. We will check lab work as outlined. Pap smear and colonoscopy are up to date. Mammogram is due. Declines flu and additional COVID vaccines. Tetanus vaccine is up to date. Continue routine dental and eye exams. Schedule mammogram, order placed. Encourage continued healthy diet and regular exercise. Return to care in 3 months, sooner as needed.

## 2024-08-04 NOTE — Assessment & Plan Note (Addendum)
 Her hypertension is well-controlled with amlodipine  5 mg daily and occasional hydrochlorothiazide . Blood pressure readings are stable. Monitor for increased dizziness. Encourage slow transitions. Continue amlodipine  daily and use hydrochlorothiazide  as needed.

## 2024-08-05 ENCOUNTER — Ambulatory Visit
Admission: RE | Admit: 2024-08-05 | Discharge: 2024-08-05 | Disposition: A | Discharge status: Home / Self Care | Source: Ambulatory Visit | Attending: Nurse Practitioner | Admitting: Nurse Practitioner

## 2024-08-05 DIAGNOSIS — R2243 Localized swelling, mass and lump, lower limb, bilateral: Secondary | ICD-10-CM | POA: Diagnosis not present

## 2024-08-05 DIAGNOSIS — M85461 Solitary bone cyst, right tibia and fibula: Secondary | ICD-10-CM | POA: Diagnosis not present

## 2024-08-18 ENCOUNTER — Encounter: Payer: Self-pay | Admitting: Nurse Practitioner

## 2024-08-19 MED ORDER — WEGOVY 2.4 MG/0.75ML ~~LOC~~ SOAJ: 2.4000 mg | SUBCUTANEOUS | 1 refills | Status: DC

## 2024-08-19 MED ORDER — ONDANSETRON HCL 4 MG PO TABS: 4.0000 mg | ORAL_TABLET | Freq: Three times a day (TID) | ORAL | 0 refills | Status: DC | PRN

## 2024-08-20 MED ORDER — WEGOVY 2.4 MG/0.75ML ~~LOC~~ SOAJ: 2.4000 mg | SUBCUTANEOUS | 0 refills | Status: DC

## 2024-08-20 NOTE — Addendum Note (Signed)
 Addended by: ORLANDO KINGDOM on: 08/20/2024 08:12 AM   Modules accepted: Orders

## 2024-08-26 ENCOUNTER — Ambulatory Visit
Admission: RE | Admit: 2024-08-26 | Discharge: 2024-08-26 | Disposition: A | Discharge status: Home / Self Care | Source: Ambulatory Visit | Attending: Nurse Practitioner | Admitting: Nurse Practitioner

## 2024-08-26 DIAGNOSIS — Z1231 Encounter for screening mammogram for malignant neoplasm of breast: Secondary | ICD-10-CM | POA: Diagnosis not present

## 2024-09-01 ENCOUNTER — Encounter: Payer: Self-pay | Admitting: Nurse Practitioner

## 2024-09-02 ENCOUNTER — Telehealth: Payer: Self-pay

## 2024-09-02 NOTE — Telephone Encounter (Signed)
 Copied from CRM [id number]. Topic: Appointments - Appointment Scheduling >> Sep 02, 2024  8:49 AM Rea ORN wrote: Patient called to schedule an appt to go over options with Wegovy  med change. The soonest available appt is December 11th and the pt is asking to be seen soon incase she needs to be weaned off med sooner. Please call back to schedule.  I left a voicemail for patient letting her know that we did receive her message, and an appointment has opened up on Leron Glance, FNP-C's schedule for tomorrow at 2pm.  I asked patient to please call and let us  know whether or not this will work with her schedule.  I let her know that I can hold the appointment for a little while, and asked her to please call us  back as soon as she can.  E2C2 - when patient calls back, please transfer the call to our office and ask for Luke or Darice.  Patient called back before I finished documenting to state she will be able to see Leron tomorrow at 2pm.  I scheduled the appointment.

## 2024-09-03 ENCOUNTER — Ambulatory Visit: Admitting: Nurse Practitioner

## 2024-09-03 VITALS — BP 108/60 | HR 69 | Temp 98.1°F | Ht 62.0 in | Wt 177.2 lb

## 2024-09-03 DIAGNOSIS — E669 Obesity, unspecified: Secondary | ICD-10-CM

## 2024-09-03 DIAGNOSIS — I10 Essential (primary) hypertension: Secondary | ICD-10-CM | POA: Diagnosis not present

## 2024-09-03 MED ORDER — WEGOVY 2.4 MG/0.75ML ~~LOC~~ SOAJ: 2.4000 mg | SUBCUTANEOUS | 0 refills | Status: DC

## 2024-09-03 NOTE — Progress Notes (Signed)
 Leron Glance, NP-C Phone: [phone]  Laura Espinoza is a 48 y.o. female who presents today for weight management.   Discussed the use of AI scribe software for clinical note transcription with the patient, who gave verbal consent to proceed.  History of Present Illness   Laura Espinoza is a 48 year old female who presents with concerns about discontinuation of Wegovy  due to insurance changes.  She is concerned about discontinuing Wegovy , which she has been using for weight management, due to changes in her insurance coverage. She has successfully lost 25 pounds with this medication and is exploring options to maintain her weight loss without it, as she is not willing to pay the high out-of-pocket cost. She is considering reducing the frequency of her doses to extend her current supply.  She experienced a recent episode of high blood pressure, measured at 155/96, accompanied by a sharp headache. She attributes the recent spike in blood pressure to pain. She took a diuretic, which kept her up all night, but her blood pressure is now lower at 108/68.  She has been on Wegovy  for a significant period and has developed dietary habits that help maintain her weight loss. Even when she was late on her medication, she did not feel nauseated and maintained her dietary discipline.      Social History   Tobacco Use  Smoking Status Never  Smokeless Tobacco Never    Current Outpatient Medications on File Prior to Visit  Medication Sig Dispense Refill   albuterol  (VENTOLIN  HFA) 108 (90 Base) MCG/ACT inhaler Inhale 2 puffs into the lungs every 6 (six) hours as needed for wheezing or shortness of breath. 8 g 2   amLODipine  (NORVASC ) 5 MG tablet TAKE 1 TABLET(5 MG) BY MOUTH DAILY 90 tablet 3   etonogestrel  (NEXPLANON ) 68 MG IMPL implant 1 each by Subdermal route once.     fluticasone -salmeterol (ADVAIR) 100-50 MCG/ACT AEPB Inhale 1 puff into the lungs 2 (two) times daily. 60 each 11    hydrochlorothiazide  (MICROZIDE ) 12.5 MG capsule Take 1 capsule (12.5 mg total) by mouth as needed (for elevated blood pressure). 90 capsule 3   metroNIDAZOLE (METROGEL) 1 % gel SMARTSIG:Sparingly Topical Daily     omeprazole (PRILOSEC) 20 MG capsule Take 20 mg by mouth daily.     ondansetron  (ZOFRAN ) 4 MG tablet Take 1 tablet (4 mg total) by mouth every 8 (eight) hours as needed for nausea or vomiting. 20 tablet 0   ondansetron  (ZOFRAN -ODT) 4 MG disintegrating tablet Take 1 tablet (4 mg total) by mouth every 8 (eight) hours as needed for nausea or vomiting. 30 tablet 0   No current facility-administered medications on file prior to visit.     ROS see history of present illness  Objective  Physical Exam Vitals:   09/03/24 1402  BP: 108/60  Pulse: 69  Temp: 98.1 F (36.7 C)  SpO2: 97%    BP Readings from Last 3 Encounters:  09/03/24 108/60  07/23/24 120/70  03/11/24 128/72   Wt Readings from Last 3 Encounters:  09/03/24 177 lb 3.2 oz (80.4 kg)  07/23/24 179 lb 6.4 oz (81.4 kg)  03/11/24 193 lb 9.6 oz (87.8 kg)    Physical Exam Constitutional:      General: She is not in acute distress.    Appearance: Normal appearance.  HENT:     Head: Normocephalic.  Cardiovascular:     Rate and Rhythm: Normal rate and regular rhythm.     Heart sounds: Normal  heart sounds.  Pulmonary:     Effort: Pulmonary effort is normal.     Breath sounds: Normal breath sounds.  Skin:    General: Skin is warm and dry.  Neurological:     General: No focal deficit present.     Mental Status: She is alert.  Psychiatric:        Mood and Affect: Mood normal.        Behavior: Behavior normal.      Assessment/Plan: Please see individual problem list.  Obesity (BMI 30-39.9) Assessment & Plan: She lost 25 pounds with Wegovy , but insurance coverage is ending, raising concerns about maintaining weight loss without high costs. Continue Wegovy  2.4 mg with a three-month supply sent to [facility]. In  December, reduce Wegovy  to every other week to extend the supply and assess weight maintenance. Encourage lifestyle modifications and dietary changes. Reassess in December and adjust the plan as needed.   Orders: -     Wegovy ; Inject 2.4 mg into the skin once a week.  Dispense: 9 mL; Refill: 0  Primary hypertension Assessment & Plan: Her blood pressure showed a transient increase likely due to headache and pain, but it is currently normal. Currently managed with amlodipine  5 mg daily and hydrochlorothiazide  as needed. Continue current medication regimen. Monitor blood pressure regularly.      Return for as scheduled.   Leron Glance, NP-C [facility]

## 2024-09-11 ENCOUNTER — Encounter: Payer: Self-pay | Admitting: Nurse Practitioner

## 2024-09-11 NOTE — Assessment & Plan Note (Signed)
 Her blood pressure showed a transient increase likely due to headache and pain, but it is currently normal. Currently managed with amlodipine  5 mg daily and hydrochlorothiazide  as needed. Continue current medication regimen. Monitor blood pressure regularly.

## 2024-09-11 NOTE — Assessment & Plan Note (Signed)
 She lost 25 pounds with Wegovy , but insurance coverage is ending, raising concerns about maintaining weight loss without high costs. Continue Wegovy  2.4 mg with a three-month supply sent to [facility]. In December, reduce Wegovy  to every other week to extend the supply and assess weight maintenance. Encourage lifestyle modifications and dietary changes. Reassess in December and adjust the plan as needed.

## 2024-11-13 ENCOUNTER — Ambulatory Visit: Admitting: Nurse Practitioner

## 2024-11-13 DIAGNOSIS — E669 Obesity, unspecified: Secondary | ICD-10-CM

## 2024-11-13 DIAGNOSIS — I10 Essential (primary) hypertension: Secondary | ICD-10-CM

## 2024-11-20 DIAGNOSIS — J101 Influenza due to other identified influenza virus with other respiratory manifestations: Secondary | ICD-10-CM | POA: Diagnosis not present

## 2024-11-20 DIAGNOSIS — R509 Fever, unspecified: Secondary | ICD-10-CM | POA: Diagnosis not present

## 2024-11-27 ENCOUNTER — Ambulatory Visit: Admitting: Nurse Practitioner

## 2024-11-27 ENCOUNTER — Encounter: Payer: Self-pay | Admitting: Nurse Practitioner

## 2024-11-27 VITALS — BP 128/80 | HR 75 | Temp 98.3°F | Ht 62.0 in | Wt 174.2 lb

## 2024-11-27 DIAGNOSIS — E669 Obesity, unspecified: Secondary | ICD-10-CM | POA: Diagnosis not present

## 2024-11-27 DIAGNOSIS — I10 Essential (primary) hypertension: Secondary | ICD-10-CM | POA: Diagnosis not present

## 2024-11-27 DIAGNOSIS — R110 Nausea: Secondary | ICD-10-CM | POA: Diagnosis not present

## 2024-11-27 MED ORDER — ONDANSETRON 4 MG PO TBDP: 4.0000 mg | ORAL_TABLET | Freq: Three times a day (TID) | ORAL | 0 refills | Status: AC | PRN

## 2024-11-27 MED ORDER — WEGOVY 2.4 MG/0.75ML ~~LOC~~ SOAJ: 2.4000 mg | SUBCUTANEOUS | 0 refills | Status: AC

## 2024-11-27 NOTE — Assessment & Plan Note (Signed)
 A recent elevated blood pressure episode was likely stress-related. Her current blood pressure is controlled at 128/80 with no symptoms reported. Continue amlodipine  5 mg daily and use hydrochlorothiazide  12.5 mg as needed for elevated blood pressure.

## 2024-11-27 NOTE — Assessment & Plan Note (Addendum)
 She has lost 30 pounds with Wegovy , with her current weight between 170-174 pounds. Insurance requires a three-month supply. Discussed extending the dosing interval to manage supply and cost, while maintaining appetite control. Refilled Wegovy  for a three-month supply and extended the dosing interval to every 10-14 days. Will monitor weight and appetite control. Encourage lifestyle modifications and dietary changes.

## 2024-11-27 NOTE — Progress Notes (Signed)
 Leron Glance, NP-C Phone: [phone]  Laura Espinoza is a 48 y.o. female who presents today for follow up.   Discussed the use of AI scribe software for clinical note transcription with the patient, who gave verbal consent to proceed.  History of Present Illness   VERNELLA NIZNIK is a 48 year old female with obesity and hypertension who presents for follow-up on weight management with Wegovy .  She has lost approximately 30 pounds since starting Wegovy  earlier this year, with her current weight being around 170-174 pounds. She is on a dose of 2.4 mg weekly. Due to insurance changes, she requires a three-month supply refill. She has previously managed to extend her medication supply by taking it every 10 to 14 days, which she successfully did during a trip to [place] last month without affecting her appetite or weight loss.  During her recent trip to [place], she experienced elevated blood pressure readings, reaching 150s/90s, accompanied by headaches. She attributes this to possible stress from travel. She managed her blood pressure by taking hydrochlorothiazide  12.5 mg as needed, in addition to her regular amlodipine  5 mg daily. Since returning from her trip, she reports no further headaches, chest pain, shortness of breath, dizziness, or swelling.  She was sick with the flu last week, which led her to visit urgent care due to a fever persisting for two nights. She was treated and is now feeling better.  She requests a refill for Zofran , preferring the dissolvable form for convenience.  No current headaches, chest pain, shortness of breath, dizziness, or swelling.      Tobacco Use History[1]  Medications Ordered Prior to Encounter[2]   ROS see history of present illness  Objective  Physical Exam Vitals:   11/27/24 1111  BP: 128/80  Pulse: 75  Temp: 98.3 F (36.8 C)  SpO2: 99%    BP Readings from Last 3 Encounters:  11/27/24 128/80  09/03/24 108/60  07/23/24 120/70   Wt  Readings from Last 3 Encounters:  11/27/24 174 lb 3.2 oz (79 kg)  09/03/24 177 lb 3.2 oz (80.4 kg)  07/23/24 179 lb 6.4 oz (81.4 kg)    Physical Exam Constitutional:      General: She is not in acute distress.    Appearance: Normal appearance.  HENT:     Head: Normocephalic.  Cardiovascular:     Rate and Rhythm: Normal rate and regular rhythm.     Heart sounds: Normal heart sounds.  Pulmonary:     Effort: Pulmonary effort is normal.     Breath sounds: Normal breath sounds.  Skin:    General: Skin is warm and dry.  Neurological:     General: No focal deficit present.     Mental Status: She is alert.  Psychiatric:        Mood and Affect: Mood normal.        Behavior: Behavior normal.      Assessment/Plan: Please see individual problem list.  Obesity (BMI 30-39.9) Assessment & Plan: She has lost 30 pounds with Wegovy , with her current weight between 170-174 pounds. Insurance requires a three-month supply. Discussed extending the dosing interval to manage supply and cost, while maintaining appetite control. Refilled Wegovy  for a three-month supply and extended the dosing interval to every 10-14 days. Will monitor weight and appetite control. Encourage lifestyle modifications and dietary changes.   Orders: -     Wegovy ; Inject 2.4 mg into the skin once a week.  Dispense: 9 mL; Refill: 0  Primary  hypertension Assessment & Plan: A recent elevated blood pressure episode was likely stress-related. Her current blood pressure is controlled at 128/80 with no symptoms reported. Continue amlodipine  5 mg daily and use hydrochlorothiazide  12.5 mg as needed for elevated blood pressure.    Nausea -     Ondansetron ; Take 1 tablet (4 mg total) by mouth every 8 (eight) hours as needed for nausea or vomiting.  Dispense: 30 tablet; Refill: 0      Return in about 3 months (around 02/25/2025) for Follow up.   Leron Glance, NP-C [facility] [place]     [1]   Social History Tobacco Use  Smoking Status Never  Smokeless Tobacco Never  [2]  Current Outpatient Medications on File Prior to Visit  Medication Sig Dispense Refill   albuterol  (VENTOLIN  HFA) 108 (90 Base) MCG/ACT inhaler Inhale 2 puffs into the lungs every 6 (six) hours as needed for wheezing or shortness of breath. 8 g 2   amLODipine  (NORVASC ) 5 MG tablet TAKE 1 TABLET(5 MG) BY MOUTH DAILY 90 tablet 3   etonogestrel  (NEXPLANON ) 68 MG IMPL implant 1 each by Subdermal route once.     fluticasone -salmeterol (ADVAIR) 100-50 MCG/ACT AEPB Inhale 1 puff into the lungs 2 (two) times daily. 60 each 11   hydrochlorothiazide  (MICROZIDE ) 12.5 MG capsule Take 1 capsule (12.5 mg total) by mouth as needed (for elevated blood pressure). 90 capsule 3   metroNIDAZOLE (METROGEL) 1 % gel SMARTSIG:Sparingly Topical Daily     omeprazole (PRILOSEC) 20 MG capsule Take 20 mg by mouth daily.     No current facility-administered medications on file prior to visit.

## 2024-12-12 ENCOUNTER — Other Ambulatory Visit: Payer: Self-pay | Admitting: Gastroenterology

## 2024-12-12 DIAGNOSIS — K7460 Unspecified cirrhosis of liver: Secondary | ICD-10-CM

## 2024-12-15 ENCOUNTER — Ambulatory Visit (INDEPENDENT_AMBULATORY_CARE_PROVIDER_SITE_OTHER): Admitting: Certified Nurse Midwife

## 2024-12-15 VITALS — BP 130/72 | HR 75 | Resp 16 | Ht 62.0 in | Wt 177.0 lb

## 2024-12-15 DIAGNOSIS — Z3046 Encounter for surveillance of implantable subdermal contraceptive: Secondary | ICD-10-CM

## 2024-12-15 MED ORDER — ETONOGESTREL 68 MG ~~LOC~~ IMPL
68.0000 mg | DRUG_IMPLANT | Freq: Once | SUBCUTANEOUS | Status: AC
  Administered 2024-12-15: 68 mg via SUBCUTANEOUS

## 2024-12-15 NOTE — Progress Notes (Signed)
" ° ° ° ° °  GYNECOLOGY OFFICE PROCEDURE NOTE  Laura Espinoza is a 49 y.o. [phone] here for Nexplanon  removal and Nexplanon  insertion.  Last pap smear was on 05/22/2023 and was normal.  No other gynecologic concerns.  Nexplanon  Removal and Reinsertion Patient identified, informed consent performed, consent signed.   Patient does understand that irregular bleeding is a very common side effect of this medication. She was advised to have backup contraception for one week after replacement of the implant. Pregnancy test in clinic today was negative.  Appropriate time out taken. Nexplanon  site identified in left arm.  Area prepped in usual sterile fashon. One ml of 1% lidocaine  was used to anesthetize the area at the distal end of the implant. A small stab incision was made right beside the implant on the distal portion. The Nexplanon  rod was grasped using hemostats and removed without difficulty. There was minimal blood loss. There were no complications. Area was then injected with 3 ml of 1 % lidocaine . She was re-prepped with betadine, Nexplanon  removed from packaging, Device confirmed in needle, then inserted full length of needle and withdrawn per handbook instructions. Nexplanon  was able to palpated in the patient's arm; patient palpated the insert herself.  There was minimal blood loss. Patient insertion site covered with gauze and a pressure bandage to reduce any bruising. The patient tolerated the procedure well and was given post procedure instructions.  She was advised to have backup contraception for one week.      Damien Parsley, CNM [facility]

## 2024-12-15 NOTE — Patient Instructions (Signed)
 Nexplanon Instructions After Insertion  Keep bandage clean and dry for 24 hours  May use ice/Tylenol/Ibuprofen for soreness or pain  If you develop fever, drainage or increased warmth from incision site-contact office immediately  Etonogestrel Implant What is this medication? ETONOGESTREL (et oh noe JES trel) prevents ovulation and pregnancy. It belongs to a group of medications called contraceptives. This medication is a progestin hormone. This medicine may be used for other purposes; ask your health care provider or pharmacist if you have questions. COMMON BRAND NAME(S): Implanon, Nexplanon What should I tell my care team before I take this medication? They need to know if you have any of these conditions: Abnormal vaginal bleeding Blood clots Blood vessel disease Breast, cervical, endometrial, ovarian, liver, or uterine cancer Diabetes Gallbladder disease Heart disease or recent heart attack High blood pressure High cholesterol or triglycerides Kidney disease Liver disease Migraine headaches Seizures Stroke Tobacco use An unusual or allergic reaction to etonogestrel, other medications, foods, dyes, or preservatives Pregnant or trying to get pregnant Breastfeeding How should I use this medication? This device is inserted just under the skin on the inner side of your upper arm by your care team. Talk to your care team about the use of this medication in children. Special care may be needed. Overdosage: If you think you have taken too much of this medicine contact a poison control center or emergency room at once. NOTE: This medicine is only for you. Do not share this medicine with others. What if I miss a dose? This does not apply. What may interact with this medication? Do not take this medication with any of the following: Amprenavir Fosamprenavir This medication may also interact with the  following: Acitretin Aprepitant Armodafinil Bexarotene Bosentan Carbamazepine Certain antivirals for HIV or hepatitis Certain medications for fungal infections, such as fluconazole, ketoconazole, itraconazole, or voriconazole Cyclosporine Felbamate Griseofulvin Lamotrigine Modafinil Oxcarbazepine Phenobarbital Phenytoin Primidone Rifabutin Rifampin Rifapentine St. John's wort Topiramate This list may not describe all possible interactions. Give your health care provider a list of all the medicines, herbs, non-prescription drugs, or dietary supplements you use. Also tell them if you smoke, drink alcohol, or use illegal drugs. Some items may interact with your medicine. What should I watch for while using this medication? Visit your care team for regular checks on your progress. Using this medication does not protect you or your partner against HIV or other sexually transmitted infections (STIs). You should be able to feel the implant by pressing your fingertips over the skin where it was inserted. Contact your care team if you cannot feel the implant, and use a non-hormonal birth control method (such as condoms) until your care team confirms that the implant is in place. Contact your care team if you think that the implant may have broken or become bent while in your arm. You will receive a user card from your care team after the implant is inserted. The card is a record of the location of the implant in your upper arm and when it should be removed. Keep this card with your health records. What side effects may I notice from receiving this medication? Side effects that you should report to your care team as soon as possible: Allergic reactions--skin rash, itching, hives, swelling of the face, lips, tongue, or throat Blood clot--pain, swelling, or warmth in the leg, shortness of breath, chest pain Gallbladder problems--severe stomach pain, nausea, vomiting, fever Increase in blood  pressure Liver injury--right upper belly pain, loss of appetite, nausea,  light-colored stool, dark yellow or brown urine, yellowing skin or eyes, unusual weakness or fatigue New or worsening migraines or headaches Pain, redness, or irritation at injection site Stroke--sudden numbness or weakness of the face, arm, or leg, trouble speaking, confusion, trouble walking, loss of balance or coordination, dizziness, severe headache, change in vision Unusual vaginal discharge, itching, or odor Worsening mood, feelings of depression Side effects that usually do not require medical attention (report to your care team if they continue or are bothersome): Breast pain or tenderness Dark patches of skin on the face or other sun-exposed areas Irregular menstrual cycles or spotting Nausea Weight gain This list may not describe all possible side effects. Call your doctor for medical advice about side effects. You may report side effects to FDA at 1-800-FDA-1088. Where should I keep my medication? This medication is given in a hospital or clinic and will not be stored at home. NOTE: This sheet is a summary. It may not cover all possible information. If you have questions about this medicine, talk to your doctor, pharmacist, or health care provider.  2024 [employer] (2022-07-04 00:00:00)

## 2025-01-08 ENCOUNTER — Other Ambulatory Visit

## 2025-01-27 ENCOUNTER — Other Ambulatory Visit

## 2025-02-26 ENCOUNTER — Ambulatory Visit: Admitting: Nurse Practitioner

## 2025-03-05 ENCOUNTER — Ambulatory Visit: Admitting: Nurse Practitioner
